# Patient Record
Sex: Male | Born: 1950
Health system: Southern US, Community
[De-identification: ages and names within clinical notes are randomized; demographics above are authoritative.]

## PROBLEM LIST (undated history)

## (undated) DIAGNOSIS — G4730 Sleep apnea, unspecified: Secondary | ICD-10-CM

## (undated) DIAGNOSIS — R7303 Prediabetes: Secondary | ICD-10-CM

## (undated) DIAGNOSIS — M1990 Unspecified osteoarthritis, unspecified site: Secondary | ICD-10-CM

## (undated) DIAGNOSIS — I219 Acute myocardial infarction, unspecified: Secondary | ICD-10-CM

## (undated) DIAGNOSIS — I255 Ischemic cardiomyopathy: Secondary | ICD-10-CM

## (undated) DIAGNOSIS — I10 Essential (primary) hypertension: Secondary | ICD-10-CM

## (undated) DIAGNOSIS — I2510 Atherosclerotic heart disease of native coronary artery without angina pectoris: Secondary | ICD-10-CM

## (undated) DIAGNOSIS — E785 Hyperlipidemia, unspecified: Secondary | ICD-10-CM

## (undated) HISTORY — PX: APPENDECTOMY: SHX54

## (undated) HISTORY — DX: Atherosclerotic heart disease of native coronary artery without angina pectoris: I25.10

## (undated) HISTORY — PX: CORONARY STENT PLACEMENT: SHX1402

## (undated) HISTORY — PX: TRANSTHORACIC ECHOCARDIOGRAM: SHX275

## (undated) HISTORY — DX: Hyperlipidemia, unspecified: E78.5

## (undated) HISTORY — DX: Essential (primary) hypertension: I10

## (undated) HISTORY — DX: Ischemic cardiomyopathy: I25.5

## (undated) HISTORY — PX: INGUINAL HERNIA REPAIR: SUR1180

## (undated) HISTORY — PX: MOUTH SURGERY: SHX715

---
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
## 2011-09-22 ENCOUNTER — Other Ambulatory Visit: Payer: Self-pay | Admitting: Physician Assistant

## 2011-10-15 ENCOUNTER — Ambulatory Visit (INDEPENDENT_AMBULATORY_CARE_PROVIDER_SITE_OTHER): Payer: BC Managed Care – PPO | Admitting: Family Medicine

## 2011-10-15 ENCOUNTER — Encounter: Payer: Self-pay | Admitting: Physician Assistant

## 2011-10-15 VITALS — BP 136/96 | HR 77 | Temp 98.3°F | Resp 16 | Ht 65.5 in | Wt 200.8 lb

## 2011-10-15 DIAGNOSIS — I10 Essential (primary) hypertension: Secondary | ICD-10-CM

## 2011-10-15 DIAGNOSIS — R9431 Abnormal electrocardiogram [ECG] [EKG]: Secondary | ICD-10-CM

## 2011-10-15 DIAGNOSIS — E785 Hyperlipidemia, unspecified: Secondary | ICD-10-CM

## 2011-10-15 DIAGNOSIS — Z0000 Encounter for general adult medical examination without abnormal findings: Secondary | ICD-10-CM

## 2011-10-15 LAB — POCT UA - MICROSCOPIC ONLY
Bacteria, U Microscopic: NEGATIVE
Casts, Ur, LPF, POC: NEGATIVE
Crystals, Ur, HPF, POC: NEGATIVE
Epithelial cells, urine per micros: NEGATIVE
Mucus, UA: NEGATIVE
RBC, urine, microscopic: NEGATIVE
Yeast, UA: NEGATIVE

## 2011-10-15 LAB — CBC
HCT: 42.8 % (ref 39.0–52.0)
Hemoglobin: 15.4 g/dL (ref 13.0–17.0)
MCH: 34.6 pg — ABNORMAL HIGH (ref 26.0–34.0)
MCHC: 36 g/dL (ref 30.0–36.0)
MCV: 96.2 fL (ref 78.0–100.0)
Platelets: 190 10*3/uL (ref 150–400)
RBC: 4.45 MIL/uL (ref 4.22–5.81)
RDW: 11.9 % (ref 11.5–15.5)
WBC: 5.2 10*3/uL (ref 4.0–10.5)

## 2011-10-15 LAB — POCT URINALYSIS DIPSTICK
Bilirubin, UA: NEGATIVE
Blood, UA: NEGATIVE
Glucose, UA: NEGATIVE
Ketones, UA: 1.02
Leukocytes, UA: NEGATIVE
Nitrite, UA: NEGATIVE
Protein, UA: NEGATIVE
Spec Grav, UA: 1.02
Urobilinogen, UA: 0.2
pH, UA: 5.5

## 2011-10-15 LAB — IFOBT (OCCULT BLOOD): IFOBT: NEGATIVE

## 2011-10-15 MED ORDER — ATORVASTATIN CALCIUM 20 MG PO TABS: 20.0000 mg | ORAL_TABLET | Freq: Every day | ORAL | Status: DC

## 2011-10-15 MED ORDER — AMLODIPINE BESYLATE 10 MG PO TABS: 10.0000 mg | ORAL_TABLET | Freq: Every day | ORAL | Status: DC

## 2011-10-15 MED ORDER — LISINOPRIL 10 MG PO TABS: 10.0000 mg | ORAL_TABLET | Freq: Every day | ORAL | Status: DC

## 2011-10-15 NOTE — Progress Notes (Signed)
Patient ID: Vincent Flores MRN: [id number], DOB: 07-05-1950 61 y.o. Date of Encounter: 10/15/2011, 2:08 PM  Primary Physician: No primary provider on file.  Chief Complaint: Physical (CPE)  HPI: 61 y.o. y/o male with history noted below here for CPE. Doing well. No issues/complaints. Last CPE one year prior here at [facility]. Did see Dr. Elnoria Howard in December 2012, was told that he did not need repeat colonoscopy at that time. He in fact needs colonoscopy in December 2014. Patient still does have some constipation, but this is much better.  He denies any chest pain, shortness of breath, dyspnea, palpitations, wheezing, tachycardia, or edema. No sensations of reflux. No abdominal pain. His mother did pass from what he thinks was heart disease at age 49. Has never seen a cardiologist. No prior stress test. No diaphoresis, nausea, or vomiting.    Quit smoking tobacco 8 months prior. Feels good about this. Still drinks about 3 beers a night. No illicit substances. Last tetanus 2012.   Review of Systems: Consitutional: No fever, chills, fatigue, night sweats, lymphadenopathy, or weight changes. Eyes: No visual changes, eye redness, or discharge. ENT/Mouth: Ears: No otalgia, tinnitus, hearing loss, discharge. Nose: No congestion, rhinorrhea, sinus pain, or epistaxis. Throat: No sore throat, post nasal drip, or teeth pain. Cardiovascular: No CP, palpitations, diaphoresis, DOE, edema, orthopnea, PND. Respiratory: No cough, hemoptysis, SOB, or wheezing. Gastrointestinal: No anorexia, dysphagia, reflux, pain, nausea, vomiting, hematemesis, diarrhea, constipation, BRBPR, or melena. Genitourinary: No dysuria, frequency, urgency, hematuria, incontinence, nocturia, decreased urinary stream, discharge, impotence, or testicular pain/masses. Musculoskeletal: No decreased ROM, myalgias, stiffness, joint swelling, or weakness. Skin: No rash, erythema, lesion changes, pain, warmth, jaundice, or pruritis. Neurological: No  headache, dizziness, syncope, seizures, tremors, memory loss, coordination problems, or paresthesias. Psychological: No anxiety, depression, hallucinations, SI/HI. Endocrine: No fatigue, polydipsia, polyphagia, polyuria, or known diabetes. All other systems were reviewed and are otherwise negative.  Past Medical History  Diagnosis Date  . HTN (hypertension)   . Hyperlipidemia      Past Surgical History  Procedure Date  . Appendectomy 1967    Home Meds:  Prior to Admission medications   Medication Sig Start Date End Date Taking? Authorizing Provider  amLODipine (NORVASC) 10 MG tablet Take 10 mg by mouth daily.   Yes Historical Provider, MD  atorvastatin (LIPITOR) 20 MG tablet Take 20 mg by mouth daily.   Yes Historical Provider, MD  lisinopril (PRINIVIL,ZESTRIL) 10 MG tablet TAKE 1 TABLET BY MOUTH EVERY DAY 09/22/11  Yes Morrell Riddle, PA-C    Allergies: No Known Allergies  History   Social History  . Marital Status: Married    Spouse Name: N/A    Number of Children: N/A  . Years of Education: N/A   Occupational History  . Not on file.   Social History Main Topics  . Smoking status: Former Games developer  . Smokeless tobacco: Never Used  . Alcohol Use: 10.5 oz/week    21 drink(s) per week  . Drug Use: No  . Sexually Active: Yes -- Male partner(s)   Other Topics Concern  . Not on file   Social History Narrative  . No narrative on file    Family History  Problem Relation Age of Onset  . Heart disease Mother   . Cancer Father     Lung, was a smoker    Physical Exam: Blood pressure 136/96, pulse 77, temperature 98.3 F (36.8 C), temperature source Oral, resp. rate 16, height 5' 5.5" (1.664 m), weight 200 lb  12.8 oz (91.082 kg), SpO2 98.00%.  General: Well developed, well nourished, in no acute distress. HEENT: Normocephalic, atraumatic. Conjunctiva pink, sclera non-icteric. Pupils 2 mm constricting to 1 mm, round, regular, and equally reactive to light and  accomodation. EOMI. Internal auditory canal clear. TMs with good cone of light and without pathology. Nasal mucosa pink. Nares are without discharge. No sinus tenderness. Oral mucosa pink. Dentition normal. Pharynx without exudate.   Neck: Supple. Trachea midline. No thyromegaly. Full ROM. No lymphadenopathy. Lungs: Clear to auscultation bilaterally without wheezes, rales, or rhonchi. Breathing is of normal effort and unlabored. Cardiovascular: RRR with S1 S2. No murmurs, rubs, or gallops appreciated. Distal pulses 2+ symmetrically. No carotid or abdominal bruits. Abdomen: Soft, non-tender, non-distended with normoactive bowel sounds. No hepatosplenomegaly or masses. No rebound/guarding. No CVA tenderness. Without hernias.  Rectal: No external hemorrhoids or fissures. Rectal vault without masses. Prostate smooth and symmetrical without lesions or tenderness to palpation. Multiple skin tags present around external anus.   Genitourinary: Circumcised male. No penile lesions. Testes descended bilaterally, and smooth without tenderness or masses.  Musculoskeletal: Full range of motion and 5/5 strength throughout. Without swelling, atrophy, tenderness, crepitus, or warmth. Extremities without clubbing, cyanosis, or edema. Calves supple. Skin: Warm and moist without erythema, ecchymosis, wounds, or rash. Neuro: A+Ox3. CN II-XII grossly intact. Moves all extremities spontaneously. Full sensation throughout. Normal gait. DTR 2+ throughout upper and lower extremities. Finger to nose intact. Psych:  Responds to questions appropriately with a normal affect.   Studies:  Results for orders placed in visit on 10/15/11  POCT UA - MICROSCOPIC ONLY      Component Value Range   WBC, Ur, HPF, POC nrg     RBC, urine, microscopic neg     Bacteria, U Microscopic neg     Mucus, UA neg     Epithelial cells, urine per micros neg     Crystals, Ur, HPF, POC neg     Casts, Ur, LPF, POC neg     Yeast, UA neg    POCT  URINALYSIS DIPSTICK      Component Value Range   Color, UA yellow     Clarity, UA clear     Glucose, UA neg     Bilirubin, UA neg     Ketones, UA 1.020     Spec Grav, UA 1.020     Blood, UA neg     pH, UA 5.5     Protein, UA neg     Urobilinogen, UA 0.2     Nitrite, UA neg     Leukocytes, UA Negative    IFOBT (OCCULT BLOOD)      Component Value Range   IFOBT Negative      CBC, CMET, Lipid, PSA, TSH all pending. Patient is fasting.  EKG: No reciprocal ST changes, nonspecific ST changes, diffuse P wave abnormalities c/w longstanding smoking history discussed with Dr. Jacinto Halim.   Assessment/Plan:  61 y.o. y/o Caucasian male here for CPE with abnormal EKG, hypertension, and hyperlipidemia. 1. Abnormal EKG -Called and spoke with Dr. Jacinto Halim -EKG faxed to Dr. Jacinto Halim for interpretation -See above EKG interpretation -Patient referred to cardiology for risk stratification -Initial EKG taken at 2 PM second EKG taken at 3:30 PM to check for any evolving changes and none were seen. Patient was discharged home with ER precautions and will see Dr. Jacinto Halim for risk stratification. -RTC/ER precautions  2. Hypertension -Controlled at baseline and at home -Continue current treatment -Refilled medications -Norvasc 10 mg  1 po daily #90 RF 3 -Lisinopril 10 mg 1 po daily #90 RF 3 -Check readings periodically at home, call if elevated -Healthy diet and weight loss -Kudos for quitting tobacco use  3. Hyperlipidemia -Continue Lipitor 20 mg 1 po daily #90 RF 3 -Healthy diet and exercise -Weight loss -Await labs  4. CPE -Healthy diet and exercise -Weight loss -Kudos for quitting smoking -Declines flu vaccine today -Await labs -Up to date with colonoscopy, saw Dr. Elnoria Howard in December 2012, was told did not need follow up colonoscopy until December 2014. -Anticipatory guidance  Signed, Eula Listen, PA-C 10/15/2011 2:08 PM

## 2011-10-16 ENCOUNTER — Other Ambulatory Visit: Payer: Self-pay | Admitting: Physician Assistant

## 2011-10-16 LAB — COMPREHENSIVE METABOLIC PANEL
ALT: 27 U/L (ref 0–53)
AST: 19 U/L (ref 0–37)
Albumin: 4.5 g/dL (ref 3.5–5.2)
Alkaline Phosphatase: 68 U/L (ref 39–117)
BUN: 10 mg/dL (ref 6–23)
CO2: 25 mEq/L (ref 19–32)
Calcium: 9.2 mg/dL (ref 8.4–10.5)
Chloride: 104 mEq/L (ref 96–112)
Creat: 0.91 mg/dL (ref 0.50–1.35)
Glucose, Bld: 93 mg/dL (ref 70–99)
Potassium: 3.9 mEq/L (ref 3.5–5.3)
Sodium: 136 mEq/L (ref 135–145)
Total Bilirubin: 0.6 mg/dL (ref 0.3–1.2)
Total Protein: 6.8 g/dL (ref 6.0–8.3)

## 2011-10-16 LAB — LIPID PANEL
Cholesterol: 228 mg/dL — ABNORMAL HIGH (ref 0–200)
HDL: 52 mg/dL (ref >39)
LDL Cholesterol: 145 mg/dL — ABNORMAL HIGH (ref 0–99)
Total CHOL/HDL Ratio: 4.4 Ratio
Triglycerides: 154 mg/dL — ABNORMAL HIGH (ref <150)
VLDL: 31 mg/dL (ref 0–40)

## 2011-10-16 LAB — TSH: TSH: 2.443 u[IU]/mL (ref 0.350–4.500)

## 2011-10-16 LAB — PSA: PSA: 0.57 ng/mL (ref <=4.00)

## 2011-10-16 MED ORDER — ROSUVASTATIN CALCIUM 20 MG PO TABS: 20.0000 mg | ORAL_TABLET | Freq: Every day | ORAL | Status: DC

## 2011-10-16 NOTE — Progress Notes (Signed)
Please see detailed note under labs from CPE on 10/15/11.  

## 2011-10-17 NOTE — Progress Notes (Signed)
EKG read and patient discussed with Eula Listen, PA-C. Agree with assessment and plan of care per his note, including cardiology follow up as abnormal ekg discussed with cardiologist.

## 2011-10-18 ENCOUNTER — Ambulatory Visit
Admission: RE | Admit: 2011-10-18 | Discharge: 2011-10-18 | Disposition: A | Discharge status: Home / Self Care | Payer: BC Managed Care – PPO | Source: Ambulatory Visit | Attending: Cardiology | Admitting: Cardiology

## 2011-10-18 ENCOUNTER — Other Ambulatory Visit: Payer: Self-pay | Admitting: Cardiology

## 2011-10-18 DIAGNOSIS — R0602 Shortness of breath: Secondary | ICD-10-CM

## 2011-10-22 ENCOUNTER — Telehealth: Payer: Self-pay

## 2011-10-22 MED ORDER — ROSUVASTATIN CALCIUM 20 MG PO TABS: 20.0000 mg | ORAL_TABLET | Freq: Every day | ORAL | Status: DC

## 2011-10-22 NOTE — Telephone Encounter (Signed)
Spouse would like to know if her husband got his flu shot.   Also, wants the crestor script written and she will pick it up this time.  In the future she wants it to go to [facility].   Please call [phone]

## 2011-10-22 NOTE — Telephone Encounter (Signed)
LMOM stating a 30 day supply of Crestor is at [facility]. Stated we would change the other 5 refills to be filled at [facility]. Also LMOM stating that Bensen did not get his flu shot last week. JF  AMY: Please send 5 refills of Crestor 20mg  QD # 30 to [facility] for PT. Thanks!!!

## 2011-10-22 NOTE — Telephone Encounter (Signed)
Sent this in for him. 

## 2011-10-24 ENCOUNTER — Telehealth: Payer: Self-pay

## 2011-10-24 ENCOUNTER — Other Ambulatory Visit: Payer: Self-pay | Admitting: Physician Assistant

## 2011-10-24 NOTE — Telephone Encounter (Signed)
Patient's wife is calling stating that [facility] requires a 90 day supply prescription for crestor and we only sent 30 days? They informed her we need to call [phone] to request a fax from them for authorization??? I asked her to call that number and give our fax number. If all goes well, they will be sending [place] a fax to resend the correct rx. Patient is worried about further delays on receiving and beginning the medication.  Best [phone]  [facility] - [place], [place] - [address]

## 2011-10-24 NOTE — Progress Notes (Signed)
Please call the patient and have him come in and see me when he is able to over the next week or two for a CXR both PA and lateral at the recommendation of Dr. Jacinto Halim.   Eula Listen, PA-C 10/24/2011 8:06 PM

## 2011-10-25 ENCOUNTER — Telehealth: Payer: Self-pay | Admitting: Radiology

## 2011-10-25 NOTE — Progress Notes (Signed)
I have left message for him to call me back with recommendation from Dr Jacinto Halim, he needs chest xray

## 2011-10-25 NOTE — Telephone Encounter (Signed)
Pt returning Amy L phone call. Please contact.

## 2011-10-25 NOTE — Telephone Encounter (Signed)
Vincent Flores 10/24/2011 8:06 PM Signed  Please call the patient and have him come in and see me when he is able to over the next week or two for a CXR both PA and lateral at the recommendation of Dr. Jacinto Halim.    I left message for patient to call me back about these recommendations.

## 2011-10-28 NOTE — Telephone Encounter (Signed)
Pt wife called to say that pt has already had a chest x-ray. Please contact pt on what to advise. [phone] or Dorinda Hill [phone]

## 2011-10-29 ENCOUNTER — Telehealth: Payer: Self-pay | Admitting: Family Medicine

## 2011-10-29 MED ORDER — ROSUVASTATIN CALCIUM 20 MG PO TABS: 20.0000 mg | ORAL_TABLET | Freq: Every day | ORAL | Status: DC

## 2011-10-29 NOTE — Telephone Encounter (Signed)
I apologize. I was unaware that he had this CXR done. No further study is required.

## 2011-10-29 NOTE — Telephone Encounter (Signed)
No answer left message on machine to call back

## 2011-10-29 NOTE — Telephone Encounter (Signed)
Called wife, to advise left detailed mssg. Per her last message, to advise no further xray needed.

## 2011-10-29 NOTE — Telephone Encounter (Signed)
*  RADIOLOGY REPORT*  Clinical Data: Short of breath  CHEST - 2 VIEW  Comparison: 04/24/2010  Findings: Lungs are clear without infiltrate or effusion. Negative  for heart failure. No mass lesion is identified. Mild thoracic  degenerative changes and spurring.  IMPRESSION:  No active cardiopulmonary disease.  Original Report Authenticated By: Camelia Phenes, M.D.   *Patient had this chest xray done at [facility] on 10/18/11 please advise if you need an additional chest xray or if you were unaware he had this one done*   ,  Wall, RT 10/25/2011 9:42 AM Signed  I have left message for him to call me back with recommendation from Dr Jacinto Halim, he needs chest xray Eula Listen, PA-C 10/24/2011 8:06 PM Signed  Please call the patient and have him come in and see me when he is able to over the next week or two for a CXR both PA and lateral at the recommendation of Dr. Jacinto Halim.

## 2011-10-29 NOTE — Telephone Encounter (Signed)
Sent through order for 90 day suply

## 2011-10-29 NOTE — Telephone Encounter (Signed)
Left message to give us a call back

## 2011-10-31 ENCOUNTER — Encounter: Payer: Self-pay | Admitting: Family Medicine

## 2011-11-03 ENCOUNTER — Other Ambulatory Visit: Payer: Self-pay

## 2011-11-03 MED ORDER — ROSUVASTATIN CALCIUM 20 MG PO TABS: 20.0000 mg | ORAL_TABLET | Freq: Every day | ORAL | Status: DC

## 2011-11-08 HISTORY — PX: TRANSTHORACIC ECHOCARDIOGRAM: SHX275

## 2012-02-14 ENCOUNTER — Encounter: Payer: Self-pay | Admitting: *Deleted

## 2012-02-14 DIAGNOSIS — R9431 Abnormal electrocardiogram [ECG] [EKG]: Secondary | ICD-10-CM | POA: Insufficient documentation

## 2013-03-26 ENCOUNTER — Encounter: Payer: Self-pay | Admitting: Family Medicine

## 2013-05-21 ENCOUNTER — Encounter: Payer: Self-pay | Admitting: Family Medicine

## 2013-12-04 ENCOUNTER — Ambulatory Visit (INDEPENDENT_AMBULATORY_CARE_PROVIDER_SITE_OTHER): Payer: BC Managed Care – PPO | Admitting: Family Medicine

## 2013-12-04 ENCOUNTER — Encounter: Payer: Self-pay | Admitting: Family Medicine

## 2013-12-04 VITALS — BP 140/80 | HR 70 | Temp 98.2°F | Resp 18 | Ht 65.5 in | Wt 197.0 lb

## 2013-12-04 DIAGNOSIS — Z0000 Encounter for general adult medical examination without abnormal findings: Secondary | ICD-10-CM

## 2013-12-04 DIAGNOSIS — I10 Essential (primary) hypertension: Secondary | ICD-10-CM

## 2013-12-04 DIAGNOSIS — Z130 Encounter for screening for diseases of the blood and blood-forming organs and certain disorders involving the immune mechanism: Secondary | ICD-10-CM

## 2013-12-04 DIAGNOSIS — K219 Gastro-esophageal reflux disease without esophagitis: Secondary | ICD-10-CM

## 2013-12-04 DIAGNOSIS — Z125 Encounter for screening for malignant neoplasm of prostate: Secondary | ICD-10-CM

## 2013-12-04 DIAGNOSIS — E785 Hyperlipidemia, unspecified: Secondary | ICD-10-CM

## 2013-12-04 LAB — POCT URINALYSIS DIPSTICK
Bilirubin, UA: NEGATIVE
Blood, UA: NEGATIVE
Glucose, UA: NEGATIVE
Ketones, UA: NEGATIVE
Leukocytes, UA: NEGATIVE
Nitrite, UA: NEGATIVE
Protein, UA: NEGATIVE
Spec Grav, UA: 1.01
Urobilinogen, UA: 0.2
pH, UA: 5

## 2013-12-04 MED ORDER — AMLODIPINE BESYLATE 10 MG PO TABS: 10.0000 mg | ORAL_TABLET | Freq: Every day | ORAL | Status: DC

## 2013-12-04 MED ORDER — LISINOPRIL 10 MG PO TABS: 10.0000 mg | ORAL_TABLET | Freq: Every day | ORAL | Status: DC

## 2013-12-04 MED ORDER — LANSOPRAZOLE 30 MG PO CPDR: 30.0000 mg | DELAYED_RELEASE_CAPSULE | Freq: Every day | ORAL | Status: DC

## 2013-12-04 MED ORDER — ROSUVASTATIN CALCIUM 10 MG PO TABS: ORAL_TABLET | ORAL | Status: DC

## 2013-12-04 NOTE — Progress Notes (Signed)
 Chief Complaint:  Chief Complaint  Patient presents with  . Annual Exam    HPI: Vincent Flores is a 63 y.o. male who is here for annual,  last PE was 2013 No complaints, colonscopy was normal in 2011 Dr Benson [place] and he just had one again a couple days ago HE ahs no complaints, taking his HTN and hyperlipidemia meds but he was having msk aches and stopped taking the medication , he was only on pravastatin 10 mg  Quit smoking tobacco 8 months prior. Feels good about this. Still drinks about 3 beers a night. No illicit substances. Last tetanus 2012.   Past Medical History  Diagnosis Date  . HTN (hypertension)   . Hyperlipidemia   . Shortness of breath   . Abnormal EKG    Past Surgical History  Procedure Laterality Date  . Appendectomy  1967  . Transthoracic echocardiogram  [id number]    normal global wall motion.  normal systolic global function.  calculated EF 61 %   History   Social History  . Marital Status: Married    Spouse Name: N/A    Number of Children: N/A  . Years of Education: N/A   Social History Main Topics  . Smoking status: Former Research scientist (life sciences)  . Smokeless tobacco: Never Used  . Alcohol Use: 10.5 oz/week    21 drink(s) per week  . Drug Use: No  . Sexual Activity:    Partners: Female   Other Topics Concern  . None   Social History Narrative   Family History  Problem Relation Age of Onset  . Heart disease Mother   . Cancer Father     Lung, was a smoker   No Known Allergies Prior to Admission medications   Medication Sig Start Date End Date Taking? Authorizing Provider  amLODipine (NORVASC) 10 MG tablet Take 1 tablet (10 mg total) by mouth daily. 10/15/11  Yes Ryan M Dunn, PA-C  lansoprazole (PREVACID) 30 MG capsule Take 30 mg by mouth daily at 12 noon.   Yes Historical Provider, MD  lisinopril (PRINIVIL,ZESTRIL) 10 MG tablet Take 1 tablet (10 mg total) by mouth daily. 10/15/11   Areta Haber Dunn, PA-C  rosuvastatin (CRESTOR) 20 MG tablet Take 1 tablet (20 mg  total) by mouth daily. 11/03/11   Rise Mu, PA-C     ROS: The patient denies fevers, chills, night sweats, unintentional weight loss, chest pain, palpitations, wheezing, dyspnea on exertion, nausea, vomiting, abdominal pain, dysuria, hematuria, melena, numbness, weakness, or tingling.   All other systems have been reviewed and were otherwise negative with the exception of those mentioned in the HPI and as above.    PHYSICAL EXAM: Filed Vitals:   12/04/13 0858  BP: 140/80  Pulse: 70  Temp: 98.2 F (36.8 C)  Resp: 18   Filed Vitals:   12/04/13 0858  Height: 5' 5.5" (1.664 m)  Weight: 197 lb (89.359 kg)   Body mass index is 32.27 kg/(m^2).  General: Alert, no acute distress HEENT:  Normocephalic, atraumatic, oropharynx patent. EOMI, PERRLA  TM nl, fundoscopic exam nl, no thyroid megaly Cardiovascular:  Regular rate and rhythm, no rubs murmurs or gallops.  No Carotid bruits, radial pulse intact. No pedal edema.  Respiratory: Clear to auscultation bilaterally.  No wheezes, rales, or rhonchi.  No cyanosis, no use of accessory musculature GI: No organomegaly, abdomen is soft and non-tender, positive bowel sounds.  No masses. Skin: No rashes. Neurologic: Facial musculature symmetric. Psychiatric: Patient is appropriate  throughout our interaction. Lymphatic: No cervical lymphadenopathy Musculoskeletal: Gait intact. 5/5 2/2 DTRS UE and  GU-defer till next visit   LABS: Results for orders placed or performed in visit on 10/15/11  CBC  Result Value Ref Range   WBC 5.2 4.0 - 10.5 K/uL   RBC 4.45 4.22 - 5.81 MIL/uL   Hemoglobin 15.4 13.0 - 17.0 g/dL   HCT 42.8 39.0 - 52.0 %   MCV 96.2 78.0 - 100.0 fL   MCH 34.6 (H) 26.0 - 34.0 pg   MCHC 36.0 30.0 - 36.0 g/dL   RDW 11.9 11.5 - 15.5 %   Platelets 190 150 - 400 K/uL  Comprehensive metabolic panel  Result Value Ref Range   Sodium 136 135 - 145 mEq/L   Potassium 3.9 3.5 - 5.3 mEq/L   Chloride 104 96 - 112 mEq/L   CO2 25 19 -  32 mEq/L   Glucose, Bld 93 70 - 99 mg/dL   BUN 10 6 - 23 mg/dL   Creat 0.91 0.50 - 1.35 mg/dL   Total Bilirubin 0.6 0.3 - 1.2 mg/dL   Alkaline Phosphatase 68 39 - 117 U/L   AST 19 0 - 37 U/L   ALT 27 0 - 53 U/L   Total Protein 6.8 6.0 - 8.3 g/dL   Albumin 4.5 3.5 - 5.2 g/dL   Calcium 9.2 8.4 - 10.5 mg/dL  Lipid panel  Result Value Ref Range   Cholesterol 228 (H) 0 - 200 mg/dL   Triglycerides 154 (H) <150 mg/dL   HDL 52 >39 mg/dL   Total CHOL/HDL Ratio 4.4 Ratio   VLDL 31 0 - 40 mg/dL   LDL Cholesterol 145 (H) 0 - 99 mg/dL  PSA  Result Value Ref Range   PSA 0.57 <=4.00 ng/mL  TSH  Result Value Ref Range   TSH 2.443 0.350 - 4.500 uIU/mL  POCT UA - Microscopic Only  Result Value Ref Range   WBC, Ur, HPF, POC nrg    RBC, urine, microscopic neg    Bacteria, U Microscopic neg    Mucus, UA neg    Epithelial cells, urine per micros neg    Crystals, Ur, HPF, POC neg    Casts, Ur, LPF, POC neg    Yeast, UA neg   POCT urinalysis dipstick  Result Value Ref Range   Color, UA yellow    Clarity, UA clear    Glucose, UA neg    Bilirubin, UA neg    Ketones, UA 1.020    Spec Grav, UA 1.020    Blood, UA neg    pH, UA 5.5    Protein, UA neg    Urobilinogen, UA 0.2    Nitrite, UA neg    Leukocytes, UA Negative   IFOBT POC (occult bld, rslt in office)  Result Value Ref Range   IFOBT Negative      EKG/XRAY:   Primary read interpreted by Dr. Marin Comment at [facility].   ASSESSMENT/PLAN: Encounter Diagnoses  Name Primary?  . Annual physical exam Yes  . Screening for deficiency anemia   . Hyperlipidemia   . Essential hypertension   . Gastroesophageal reflux disease without esophagitis   . Screening for prostate cancer    Declined flu vaccine He just had colonscopy so did not want a check of his prostate , would like to defer until; next visit Annual labs pending F/u in 2 months for recheck of hyperlipidemia, he will take pravastatin 5 mg daily so 1.2 of the  10 mg and then also take  fish oil daily 2-3 capsules to see if help with his mask and jt pain.  Next visit he will need a GU exam since we deferred it sinc ehe recenlty had colonscopy.    Gross sideeffects, risk and benefits, and alternatives of medications d/w patient. Patient is aware that all medications have potential sideeffects and we are unable to predict every sideeffect or drug-drug interaction that may occur.  , [place], DO 12/04/2013 10:17 AM

## 2013-12-05 LAB — COMPREHENSIVE METABOLIC PANEL
ALT: 24 IU/L (ref 0–44)
AST: 16 IU/L (ref 0–40)
Albumin/Globulin Ratio: 2 (ref 1.1–2.5)
Albumin: 4.5 g/dL (ref 3.6–4.8)
Alkaline Phosphatase: 85 IU/L (ref 39–117)
BUN/Creatinine Ratio: 10 (ref 10–22)
BUN: 8 mg/dL (ref 8–27)
CO2: 23 mmol/L (ref 18–29)
Calcium: 9.4 mg/dL (ref 8.6–10.2)
Chloride: 103 mmol/L (ref 97–108)
Creatinine, Ser: 0.84 mg/dL (ref 0.76–1.27)
GFR calc Af Amer: 108 mL/min/{1.73_m2} (ref >59)
GFR calc non Af Amer: 94 mL/min/{1.73_m2} (ref >59)
Globulin, Total: 2.3 g/dL (ref 1.5–4.5)
Glucose: 104 mg/dL — ABNORMAL HIGH (ref 65–99)
Potassium: 4.4 mmol/L (ref 3.5–5.2)
Sodium: 140 mmol/L (ref 134–144)
Total Bilirubin: 0.4 mg/dL (ref 0.0–1.2)
Total Protein: 6.8 g/dL (ref 6.0–8.5)

## 2013-12-05 LAB — CBC WITH DIFFERENTIAL/PLATELET
Basophils Absolute: 0.1 10*3/uL (ref 0.0–0.2)
Basos: 1 %
Eos: 4 %
Eosinophils Absolute: 0.2 10*3/uL (ref 0.0–0.4)
HCT: 43.5 % (ref 37.5–51.0)
Hemoglobin: 15.7 g/dL (ref 12.6–17.7)
Immature Grans (Abs): 0 10*3/uL (ref 0.0–0.1)
Immature Granulocytes: 0 %
Lymphocytes Absolute: 1.6 10*3/uL (ref 0.7–3.1)
Lymphs: 31 %
MCH: 34.2 pg — ABNORMAL HIGH (ref 26.6–33.0)
MCHC: 36.1 g/dL — ABNORMAL HIGH (ref 31.5–35.7)
MCV: 95 fL (ref 79–97)
Monocytes Absolute: 0.6 10*3/uL (ref 0.1–0.9)
Monocytes: 12 %
Neutrophils Absolute: 2.7 10*3/uL (ref 1.4–7.0)
Neutrophils Relative %: 52 %
RBC: 4.59 x10E6/uL (ref 4.14–5.80)
RDW: 12.3 % (ref 12.3–15.4)
WBC: 5.2 10*3/uL (ref 3.4–10.8)

## 2013-12-05 LAB — LIPID PANEL
Chol/HDL Ratio: 4.5 ratio units (ref 0.0–5.0)
Cholesterol, Total: 206 mg/dL — ABNORMAL HIGH (ref 100–199)
HDL: 46 mg/dL (ref >39)
LDL Calculated: 137 mg/dL — ABNORMAL HIGH (ref 0–99)
Triglycerides: 113 mg/dL (ref 0–149)
VLDL Cholesterol Cal: 23 mg/dL (ref 5–40)

## 2013-12-05 LAB — TSH: TSH: 2.29 u[IU]/mL (ref 0.450–4.500)

## 2013-12-05 LAB — PSA: PSA: 0.4 ng/mL (ref 0.0–4.0)

## 2013-12-15 ENCOUNTER — Encounter: Payer: Self-pay | Admitting: Family Medicine

## 2014-10-29 ENCOUNTER — Inpatient Hospital Stay (HOSPITAL_COMMUNITY)
Admission: EM | Admit: 2014-10-29 | Discharge: 2014-11-01 | DRG: 247 | Disposition: A | Discharge status: Home / Self Care | Payer: BLUE CROSS/BLUE SHIELD | Attending: Cardiovascular Disease | Admitting: Cardiovascular Disease

## 2014-10-29 ENCOUNTER — Encounter (HOSPITAL_COMMUNITY)
Admission: EM | Disposition: A | Discharge status: Home / Self Care | Payer: Self-pay | Source: Home / Self Care | Attending: Cardiovascular Disease

## 2014-10-29 ENCOUNTER — Encounter (HOSPITAL_COMMUNITY): Payer: Self-pay | Admitting: *Deleted

## 2014-10-29 ENCOUNTER — Emergency Department (HOSPITAL_COMMUNITY): Payer: BLUE CROSS/BLUE SHIELD

## 2014-10-29 DIAGNOSIS — R9431 Abnormal electrocardiogram [ECG] [EKG]: Secondary | ICD-10-CM | POA: Diagnosis present

## 2014-10-29 DIAGNOSIS — Z8249 Family history of ischemic heart disease and other diseases of the circulatory system: Secondary | ICD-10-CM

## 2014-10-29 DIAGNOSIS — I2510 Atherosclerotic heart disease of native coronary artery without angina pectoris: Secondary | ICD-10-CM | POA: Diagnosis present

## 2014-10-29 DIAGNOSIS — Z955 Presence of coronary angioplasty implant and graft: Secondary | ICD-10-CM

## 2014-10-29 DIAGNOSIS — I255 Ischemic cardiomyopathy: Secondary | ICD-10-CM | POA: Diagnosis present

## 2014-10-29 DIAGNOSIS — Z87891 Personal history of nicotine dependence: Secondary | ICD-10-CM

## 2014-10-29 DIAGNOSIS — Z79899 Other long term (current) drug therapy: Secondary | ICD-10-CM

## 2014-10-29 DIAGNOSIS — R079 Chest pain, unspecified: Secondary | ICD-10-CM | POA: Diagnosis not present

## 2014-10-29 DIAGNOSIS — Z7982 Long term (current) use of aspirin: Secondary | ICD-10-CM | POA: Diagnosis not present

## 2014-10-29 DIAGNOSIS — I10 Essential (primary) hypertension: Secondary | ICD-10-CM | POA: Diagnosis present

## 2014-10-29 DIAGNOSIS — E785 Hyperlipidemia, unspecified: Secondary | ICD-10-CM | POA: Diagnosis present

## 2014-10-29 DIAGNOSIS — I214 Non-ST elevation (NSTEMI) myocardial infarction: Secondary | ICD-10-CM | POA: Diagnosis present

## 2014-10-29 HISTORY — PX: CARDIAC CATHETERIZATION: SHX172

## 2014-10-29 LAB — MRSA PCR SCREENING: MRSA by PCR: NEGATIVE

## 2014-10-29 LAB — HEPATIC FUNCTION PANEL
ALT: 37 U/L (ref 17–63)
AST: 110 U/L — ABNORMAL HIGH (ref 15–41)
Albumin: 3.4 g/dL — ABNORMAL LOW (ref 3.5–5.0)
Alkaline Phosphatase: 67 U/L (ref 38–126)
Bilirubin, Direct: 0.1 mg/dL (ref 0.1–0.5)
Indirect Bilirubin: 0.6 mg/dL (ref 0.3–0.9)
Total Bilirubin: 0.7 mg/dL (ref 0.3–1.2)
Total Protein: 6 g/dL — ABNORMAL LOW (ref 6.5–8.1)

## 2014-10-29 LAB — BASIC METABOLIC PANEL
Anion gap: 11 (ref 5–15)
BUN: 12 mg/dL (ref 6–20)
CO2: 24 mmol/L (ref 22–32)
Calcium: 10.1 mg/dL (ref 8.9–10.3)
Chloride: 106 mmol/L (ref 101–111)
Creatinine, Ser: 1.16 mg/dL (ref 0.61–1.24)
GFR calc Af Amer: >60 mL/min (ref >60)
GFR calc non Af Amer: >60 mL/min (ref >60)
Glucose, Bld: 135 mg/dL — ABNORMAL HIGH (ref 65–99)
Potassium: 3.5 mmol/L (ref 3.5–5.1)
Sodium: 141 mmol/L (ref 135–145)

## 2014-10-29 LAB — TROPONIN I
Troponin I: 20.94 ng/mL (ref <0.031)
Troponin I: 13.19 ng/mL (ref <0.031)

## 2014-10-29 LAB — I-STAT TROPONIN, ED
Troponin i, poc: 0 ng/mL (ref 0.00–0.08)
Troponin i, poc: 2.17 ng/mL (ref 0.00–0.08)

## 2014-10-29 LAB — CBC
HCT: 47.2 % (ref 39.0–52.0)
HCT: 40.8 % (ref 39.0–52.0)
Hemoglobin: 17 g/dL (ref 13.0–17.0)
Hemoglobin: 14.1 g/dL (ref 13.0–17.0)
MCH: 35.5 pg — ABNORMAL HIGH (ref 26.0–34.0)
MCH: 34.3 pg — ABNORMAL HIGH (ref 26.0–34.0)
MCHC: 36 g/dL (ref 30.0–36.0)
MCHC: 34.6 g/dL (ref 30.0–36.0)
MCV: 98.5 fL (ref 78.0–100.0)
MCV: 99.3 fL (ref 78.0–100.0)
Platelets: 201 10*3/uL (ref 150–400)
Platelets: 147 10*3/uL — ABNORMAL LOW (ref 150–400)
RBC: 4.79 MIL/uL (ref 4.22–5.81)
RBC: 4.11 MIL/uL — ABNORMAL LOW (ref 4.22–5.81)
RDW: 12.3 % (ref 11.5–15.5)
RDW: 12.4 % (ref 11.5–15.5)
WBC: 9.1 10*3/uL (ref 4.0–10.5)
WBC: 9.5 10*3/uL (ref 4.0–10.5)

## 2014-10-29 LAB — CREATININE, SERUM
Creatinine, Ser: 1.1 mg/dL (ref 0.61–1.24)
GFR calc Af Amer: >60 mL/min (ref >60)
GFR calc non Af Amer: >60 mL/min (ref >60)

## 2014-10-29 LAB — POCT ACTIVATED CLOTTING TIME
Activated Clotting Time: >1000 seconds
Activated Clotting Time: 608 seconds

## 2014-10-29 LAB — PROTIME-INR
INR: 0.91 (ref 0.00–1.49)
Prothrombin Time: 12.4 seconds (ref 11.6–15.2)

## 2014-10-29 SURGERY — LEFT HEART CATH AND CORONARY ANGIOGRAPHY
Anesthesia: LOCAL

## 2014-10-29 MED ORDER — ATORVASTATIN CALCIUM 80 MG PO TABS: 80.0000 mg | ORAL_TABLET | Freq: Every day | ORAL | Status: DC

## 2014-10-29 MED ORDER — FENTANYL CITRATE (PF) 100 MCG/2ML IJ SOLN
INTRAMUSCULAR | Status: DC | PRN
Start: 2014-10-29 — End: 2014-10-29
  Administered 2014-10-29 (×4): 50 ug via INTRAVENOUS

## 2014-10-29 MED ORDER — MIDAZOLAM HCL 2 MG/2ML IJ SOLN
INTRAMUSCULAR | Status: DC | PRN
  Administered 2014-10-29 (×4): 1 mg via INTRAVENOUS

## 2014-10-29 MED ORDER — LISINOPRIL 10 MG PO TABS
10.0000 mg | ORAL_TABLET | Freq: Every day | ORAL | Status: DC
  Administered 2014-10-30 – 2014-11-01 (×3): 10 mg via ORAL
  Filled 2014-10-29 (×3): qty 1

## 2014-10-29 MED ORDER — HEPARIN BOLUS VIA INFUSION
4000.0000 [IU] | Freq: Once | INTRAVENOUS | Status: AC
  Administered 2014-10-29: 4000 [IU] via INTRAVENOUS
  Filled 2014-10-29: qty 4000

## 2014-10-29 MED ORDER — ATORVASTATIN CALCIUM 80 MG PO TABS
80.0000 mg | ORAL_TABLET | Freq: Every day | ORAL | Status: DC
  Administered 2014-10-29 – 2014-10-31 (×3): 80 mg via ORAL
  Filled 2014-10-29 (×3): qty 1

## 2014-10-29 MED ORDER — ACETAMINOPHEN 325 MG PO TABS
650.0000 mg | ORAL_TABLET | ORAL | Status: DC | PRN
  Filled 2014-10-29: qty 2

## 2014-10-29 MED ORDER — METOPROLOL SUCCINATE ER 25 MG PO TB24: 25.0000 mg | ORAL_TABLET | Freq: Every day | ORAL | Status: DC

## 2014-10-29 MED ORDER — METOPROLOL TARTRATE 12.5 MG HALF TABLET
12.5000 mg | ORAL_TABLET | Freq: Two times a day (BID) | ORAL | Status: DC
  Administered 2014-10-29 – 2014-10-30 (×3): 12.5 mg via ORAL
  Filled 2014-10-29 (×3): qty 1

## 2014-10-29 MED ORDER — SODIUM CHLORIDE 0.9 % IJ SOLN: 3.0000 mL | INTRAMUSCULAR | Status: DC | PRN

## 2014-10-29 MED ORDER — HEPARIN (PORCINE) IN NACL 2-0.9 UNIT/ML-% IJ SOLN
INTRAMUSCULAR | Status: AC
  Filled 2014-10-29: qty 500

## 2014-10-29 MED ORDER — ACETAMINOPHEN 325 MG PO TABS: 650.0000 mg | ORAL_TABLET | ORAL | Status: DC | PRN

## 2014-10-29 MED ORDER — ASPIRIN 81 MG PO CHEW
81.0000 mg | CHEWABLE_TABLET | Freq: Every day | ORAL | Status: DC
  Administered 2014-10-30: 81 mg via ORAL
  Filled 2014-10-29 (×2): qty 1

## 2014-10-29 MED ORDER — HEPARIN SODIUM (PORCINE) 1000 UNIT/ML IJ SOLN
INTRAMUSCULAR | Status: AC
  Filled 2014-10-29: qty 1

## 2014-10-29 MED ORDER — ONDANSETRON HCL 4 MG/2ML IJ SOLN: 4.0000 mg | Freq: Four times a day (QID) | INTRAMUSCULAR | Status: DC | PRN

## 2014-10-29 MED ORDER — BIVALIRUDIN BOLUS VIA INFUSION - CUPID
INTRAVENOUS | Status: DC | PRN
  Administered 2014-10-29: 68.025 mg via INTRAVENOUS

## 2014-10-29 MED ORDER — ASPIRIN EC 81 MG PO TBEC: 81.0000 mg | DELAYED_RELEASE_TABLET | Freq: Every day | ORAL | Status: DC

## 2014-10-29 MED ORDER — ASPIRIN 81 MG PO CHEW: 81.0000 mg | CHEWABLE_TABLET | ORAL | Status: DC

## 2014-10-29 MED ORDER — MIDAZOLAM HCL 2 MG/2ML IJ SOLN
INTRAMUSCULAR | Status: AC
  Filled 2014-10-29: qty 4

## 2014-10-29 MED ORDER — TICAGRELOR 90 MG PO TABS
ORAL_TABLET | ORAL | Status: AC
  Filled 2014-10-29: qty 2

## 2014-10-29 MED ORDER — SODIUM CHLORIDE 0.9 % IV SOLN: 250.0000 mL | INTRAVENOUS | Status: DC | PRN

## 2014-10-29 MED ORDER — VERAPAMIL HCL 2.5 MG/ML IV SOLN
INTRAVENOUS | Status: DC | PRN
Start: 2014-10-29 — End: 2014-10-29
  Administered 2014-10-29 (×2): 100 ug via INTRACORONARY

## 2014-10-29 MED ORDER — ASPIRIN 81 MG PO CHEW
81.0000 mg | CHEWABLE_TABLET | Freq: Once | ORAL | Status: AC
Start: 2014-10-29 — End: 2014-10-29
  Administered 2014-10-29: 81 mg via ORAL
  Filled 2014-10-29: qty 1

## 2014-10-29 MED ORDER — TICAGRELOR 90 MG PO TABS
ORAL_TABLET | ORAL | Status: DC | PRN
  Administered 2014-10-29: 180 mg via ORAL

## 2014-10-29 MED ORDER — SODIUM CHLORIDE 0.9 % IV SOLN
INTRAVENOUS | Status: DC | PRN
  Administered 2014-10-29: 91 mL via INTRAVENOUS

## 2014-10-29 MED ORDER — ASPIRIN EC 81 MG PO TBEC
81.0000 mg | DELAYED_RELEASE_TABLET | Freq: Every day | ORAL | Status: DC
  Administered 2014-10-31 – 2014-11-01 (×2): 81 mg via ORAL
  Filled 2014-10-29 (×2): qty 1

## 2014-10-29 MED ORDER — IOHEXOL 350 MG/ML SOLN
100.0000 mL | Freq: Once | INTRAVENOUS | Status: AC | PRN
Start: 2014-10-29 — End: 2014-10-29
  Administered 2014-10-29: 100 mL via INTRAVENOUS

## 2014-10-29 MED ORDER — NITROGLYCERIN IN D5W 200-5 MCG/ML-% IV SOLN
INTRAVENOUS | Status: DC | PRN
  Administered 2014-10-29: 10 ug/min via INTRAVENOUS

## 2014-10-29 MED ORDER — FENTANYL CITRATE (PF) 100 MCG/2ML IJ SOLN
INTRAMUSCULAR | Status: AC
Start: 2014-10-29 — End: 2014-10-29
  Filled 2014-10-29: qty 4

## 2014-10-29 MED ORDER — SODIUM CHLORIDE 0.9 % WEIGHT BASED INFUSION: 1.0000 mL/kg/h | INTRAVENOUS | Status: AC

## 2014-10-29 MED ORDER — HEPARIN (PORCINE) IN NACL 2-0.9 UNIT/ML-% IJ SOLN
INTRAMUSCULAR | Status: AC
  Filled 2014-10-29: qty 1000

## 2014-10-29 MED ORDER — ENOXAPARIN SODIUM 40 MG/0.4ML ~~LOC~~ SOLN
40.0000 mg | SUBCUTANEOUS | Status: DC
  Administered 2014-10-30 – 2014-11-01 (×3): 40 mg via SUBCUTANEOUS
  Filled 2014-10-29 (×3): qty 0.4

## 2014-10-29 MED ORDER — OXYCODONE-ACETAMINOPHEN 5-325 MG PO TABS
1.0000 | ORAL_TABLET | ORAL | Status: DC | PRN
  Administered 2014-10-29 – 2014-10-31 (×3): 2 via ORAL
  Filled 2014-10-29 (×2): qty 2

## 2014-10-29 MED ORDER — HEPARIN (PORCINE) IN NACL 2-0.9 UNIT/ML-% IJ SOLN
INTRAMUSCULAR | Status: DC | PRN
  Administered 2014-10-29: 10:00:00 via INTRA_ARTERIAL

## 2014-10-29 MED ORDER — NITROGLYCERIN IN D5W 200-5 MCG/ML-% IV SOLN: 20.0000 ug/min | INTRAVENOUS | Status: AC

## 2014-10-29 MED ORDER — SODIUM CHLORIDE 0.9 % IV SOLN: INTRAVENOUS | Status: DC

## 2014-10-29 MED ORDER — LIDOCAINE HCL (PF) 1 % IJ SOLN
INTRAMUSCULAR | Status: AC
  Filled 2014-10-29: qty 30

## 2014-10-29 MED ORDER — HEPARIN SODIUM (PORCINE) 1000 UNIT/ML IJ SOLN
INTRAMUSCULAR | Status: DC | PRN
  Administered 2014-10-29: 5000 [IU] via INTRAVENOUS

## 2014-10-29 MED ORDER — FENTANYL CITRATE (PF) 100 MCG/2ML IJ SOLN
INTRAMUSCULAR | Status: AC
  Filled 2014-10-29: qty 4

## 2014-10-29 MED ORDER — BIVALIRUDIN 250 MG IV SOLR
INTRAVENOUS | Status: AC
  Filled 2014-10-29: qty 250

## 2014-10-29 MED ORDER — SODIUM CHLORIDE 0.9 % IJ SOLN
3.0000 mL | Freq: Two times a day (BID) | INTRAMUSCULAR | Status: DC
  Administered 2014-10-30 – 2014-11-01 (×4): 3 mL via INTRAVENOUS

## 2014-10-29 MED ORDER — LIDOCAINE HCL (PF) 1 % IJ SOLN
INTRAMUSCULAR | Status: DC | PRN
  Administered 2014-10-29: 5 mL

## 2014-10-29 MED ORDER — SODIUM CHLORIDE 0.9 % IJ SOLN
3.0000 mL | Freq: Two times a day (BID) | INTRAMUSCULAR | Status: DC
  Administered 2014-10-29: 3 mL via INTRAVENOUS

## 2014-10-29 MED ORDER — IOHEXOL 350 MG/ML SOLN
INTRAVENOUS | Status: DC | PRN
  Administered 2014-10-29: 240 mL via INTRA_ARTERIAL

## 2014-10-29 MED ORDER — NITROGLYCERIN 0.4 MG SL SUBL: 0.4000 mg | SUBLINGUAL_TABLET | SUBLINGUAL | Status: DC | PRN

## 2014-10-29 MED ORDER — HEPARIN (PORCINE) IN NACL 2-0.9 UNIT/ML-% IJ SOLN
INTRAMUSCULAR | Status: DC | PRN
  Administered 2014-10-29: 10:00:00

## 2014-10-29 MED ORDER — OXYCODONE-ACETAMINOPHEN 5-325 MG PO TABS
ORAL_TABLET | ORAL | Status: AC
  Filled 2014-10-29: qty 2

## 2014-10-29 MED ORDER — TICAGRELOR 90 MG PO TABS
90.0000 mg | ORAL_TABLET | Freq: Two times a day (BID) | ORAL | Status: DC
  Administered 2014-10-29 – 2014-11-01 (×6): 90 mg via ORAL
  Filled 2014-10-29 (×6): qty 1

## 2014-10-29 MED ORDER — ONDANSETRON HCL 4 MG/2ML IJ SOLN
4.0000 mg | Freq: Four times a day (QID) | INTRAMUSCULAR | Status: DC | PRN
  Administered 2014-10-29: 4 mg via INTRAVENOUS
  Filled 2014-10-29: qty 2

## 2014-10-29 MED ORDER — NITROGLYCERIN 2 % TD OINT
0.5000 [in_us] | TOPICAL_OINTMENT | Freq: Once | TRANSDERMAL | Status: AC
  Administered 2014-10-29: 0.5 [in_us] via TOPICAL
  Filled 2014-10-29: qty 1

## 2014-10-29 MED ORDER — VERAPAMIL HCL 2.5 MG/ML IV SOLN
INTRAVENOUS | Status: AC
  Filled 2014-10-29: qty 2

## 2014-10-29 MED ORDER — ASPIRIN 81 MG PO CHEW
162.0000 mg | CHEWABLE_TABLET | Freq: Once | ORAL | Status: AC
  Administered 2014-10-29: 162 mg via ORAL
  Filled 2014-10-29: qty 2

## 2014-10-29 MED ORDER — PANTOPRAZOLE SODIUM 40 MG PO TBEC
40.0000 mg | DELAYED_RELEASE_TABLET | Freq: Every day | ORAL | Status: DC
  Administered 2014-10-29 – 2014-11-01 (×4): 40 mg via ORAL
  Filled 2014-10-29 (×4): qty 1

## 2014-10-29 MED ORDER — SODIUM CHLORIDE 0.9 % IV SOLN
250.0000 mg | INTRAVENOUS | Status: DC | PRN
  Administered 2014-10-29: 1.75 mg/kg/h via INTRAVENOUS

## 2014-10-29 MED ORDER — HEPARIN (PORCINE) IN NACL 100-0.45 UNIT/ML-% IJ SOLN
1200.0000 [IU]/h | INTRAMUSCULAR | Status: DC
  Administered 2014-10-29: 1200 [IU]/h via INTRAVENOUS
  Filled 2014-10-29 (×2): qty 250

## 2014-10-29 SURGICAL SUPPLY — 22 items
BALLN EMERGE MR 2.5X12 (BALLOONS) ×2
BALLN ~~LOC~~ EUPHORA RX 3.25X20 (BALLOONS) ×2
BALLOON EMERGE MR 2.5X12 (BALLOONS) ×1 IMPLANT
BALLOON ~~LOC~~ EUPHORA RX 3.25X20 (BALLOONS) ×1 IMPLANT
CATH INFINITI 5 FR JL3.5 (CATHETERS) IMPLANT
CATH INFINITI JR4 5F (CATHETERS) ×2 IMPLANT
CATH SITESEER 5F MULTI A 2 (CATHETERS) IMPLANT
CATH VISTA GUIDE 6FR XBLAD3.0 (CATHETERS) ×2 IMPLANT
DEVICE RAD COMP TR BAND LRG (VASCULAR PRODUCTS) ×2 IMPLANT
ELECT DEFIB PAD ADLT CADENCE (PAD) ×2 IMPLANT
GLIDESHEATH SLEND A-KIT 6F 22G (SHEATH) ×2 IMPLANT
KIT ENCORE 26 ADVANTAGE (KITS) ×2 IMPLANT
KIT HEART LEFT (KITS) ×2 IMPLANT
PACK CARDIAC CATHETERIZATION (CUSTOM PROCEDURE TRAY) ×2 IMPLANT
SHEATH PINNACLE 5F 10CM (SHEATH) IMPLANT
STENT SYNERGY DES 3X24 (Permanent Stent) ×2 IMPLANT
TRANSDUCER W/STOPCOCK (MISCELLANEOUS) ×2 IMPLANT
TUBING CIL FLEX 10 FLL-RA (TUBING) ×2 IMPLANT
WIRE ASAHI PROWATER 180CM (WIRE) ×2 IMPLANT
WIRE EMERALD 3MM-J .035X150CM (WIRE) IMPLANT
WIRE HI TORQ BMW 190CM (WIRE) ×2 IMPLANT
WIRE SAFE-T 1.5MM-J .035X260CM (WIRE) ×2 IMPLANT

## 2014-10-29 NOTE — Progress Notes (Signed)
Arrived to holding area w/Angiomax drip infusing at 31.9cc/hr and NTG drip infusing at 6cc/hr

## 2014-10-29 NOTE — Progress Notes (Signed)
Ate [place] sandwich. Denies chest discomfort. Waiting on TCU bed assignment.

## 2014-10-29 NOTE — ED Provider Notes (Signed)
CSN: [id number]     Arrival date & time 10/29/14  9509 History  By signing my name below, I, Evelene Croon, attest that this documentation has been prepared under the direction and in the presence of Sharlett Iles, MD . Electronically Signed: Evelene Croon, Scribe. 10/29/2014. 4:00 AM.  Chief Complaint  Patient presents with  . Chest Pain  . Shortness of Breath    The history is provided by the patient. No language interpreter was used.    HPI Comments:  Vincent Flores is a 64 y.o. male who presents to the Emergency Department complaining of burning central CP that began ~0230 this am while seated. Pt notes his pain radiates into his back. He reports associated SOB and mild diaphoresis. No sudden onset of severe chest pain or ripping/tearing pain. He has taken Prevacid and 81 mg ASA with minimal relief PTA.  He denies PMHx of coronary stents, blood clots, and family h/o heart disease. He also denies recent illness- cough, fever, abdominal pain, blood in stool, use of blood thinners and long periods of immobilization. He is a former smoker . Past Medical History  Diagnosis Date  . HTN (hypertension)   . Hyperlipidemia   . Shortness of breath   . Abnormal EKG    Past Surgical History  Procedure Laterality Date  . Appendectomy  1967  . Transthoracic echocardiogram  [id number]    normal global wall motion.  normal systolic global function.  calculated EF 61 %   Family History  Problem Relation Age of Onset  . Heart disease Mother   . Cancer Father     Lung, was a smoker   Social History  Substance Use Topics  . Smoking status: Former Research scientist (life sciences)  . Smokeless tobacco: Never Used  . Alcohol Use: 10.5 oz/week    21 drink(s) per week    Review of Systems  10 systems reviewed and all are negative for acute change except as noted in the HPI.  Allergies  Review of patient's allergies indicates no known allergies.  Home Medications   Prior to Admission medications   Medication  Sig Start Date End Date Taking? Authorizing Provider  aspirin EC 81 MG tablet Take 81 mg by mouth daily.   Yes Historical Provider, MD  lansoprazole (PREVACID) 30 MG capsule Take 1 capsule (30 mg total) by mouth daily at 12 noon. 12/04/13  Yes Thao P Le, DO  amLODipine (NORVASC) 10 MG tablet Take 1 tablet (10 mg total) by mouth daily. Patient not taking: Reported on 10/29/2014 12/04/13   Thao P Le, DO  lisinopril (PRINIVIL,ZESTRIL) 10 MG tablet Take 1 tablet (10 mg total) by mouth daily. Patient not taking: Reported on 10/29/2014 12/04/13   Thao P Le, DO  rosuvastatin (CRESTOR) 10 MG tablet Take 1/2 tab po daily qhs Patient not taking: Reported on 10/29/2014 12/04/13   Thao P Le, DO   BP 141/96 mmHg  Pulse 71  Temp(Src) 97.5 F (36.4 C) (Oral)  Resp 13  Ht 5\' 6"  (1.676 m)  Wt 200 lb (90.719 kg)  BMI 32.30 kg/m2  SpO2 98% Physical Exam  Constitutional: He is oriented to person, place, and time. He appears well-developed and well-nourished.  Uncomfortable appearing Mild distress  HENT:  Head: Normocephalic and atraumatic.  Moist mucous membranes  Eyes: Conjunctivae are normal. Pupils are equal, round, and reactive to light.  Neck: Neck supple.  Cardiovascular: Normal rate, regular rhythm, normal heart sounds and intact distal pulses.   No murmur heard. Pulmonary/Chest:  Effort normal and breath sounds normal.  Abdominal: Soft. Bowel sounds are normal. He exhibits no distension. There is no tenderness.  Musculoskeletal: He exhibits no edema.  Neurological: He is alert and oriented to person, place, and time.  Fluent speech  Skin: Skin is warm. He is diaphoretic.  Psychiatric: He has a normal mood and affect. Judgment normal.  Nursing note and vitals reviewed.   ED Course  .Critical Care Performed by: Sharlett Iles Authorized by: Sharlett Iles Total critical care time: 40 minutes Critical care time was exclusive of separately billable procedures and treating  other patients. Critical care was necessary to treat or prevent imminent or life-threatening deterioration of the following conditions: cardiac failure. Critical care was time spent personally by me on the following activities: development of treatment plan with patient or surrogate, discussions with consultants, evaluation of patient's response to treatment, examination of patient, obtaining history from patient or surrogate, ordering and performing treatments and interventions, ordering and review of laboratory studies, ordering and review of radiographic studies and re-evaluation of patient's condition.     DIAGNOSTIC STUDIES:  Oxygen Saturation is 100% on RA, normal by my interpretation.    COORDINATION OF CARE:  3:47 AM Will order CXR, nitro and lab work.Discussed treatment plan with pt at bedside and pt agreed to plan.  Labs Review Labs Reviewed  BASIC METABOLIC PANEL - Abnormal; Notable for the following:    Glucose, Bld 135 (*)    All other components within normal limits  CBC - Abnormal; Notable for the following:    MCH 35.5 (*)    All other components within normal limits  I-STAT TROPOININ, ED - Abnormal; Notable for the following:    Troponin i, poc 2.17 (*)    All other components within normal limits  HEPARIN LEVEL (UNFRACTIONATED)  Randolm Idol, ED    Imaging Review Dg Chest 2 View  10/29/2014  CLINICAL DATA:  Initial evaluation for acute chest pain. History of hypertension. Prior smoker. EXAM: CHEST  2 VIEW COMPARISON:  Prior radiograph from 10/18/2011. FINDINGS: Cardiac and mediastinal silhouettes are stable in size and contour, and remain within normal limits. Lungs are normally inflated. No focal infiltrate to suggest pneumonia. There is mild diffuse peribronchial thickening and prominence of the interstitial markings, which may reflect atypical lung infection or possibly bronchiolitis. No overt pulmonary edema. No pleural effusion. No acute osseus abnormality.  Multilevel degenerative changes present within the visualized spine. IMPRESSION: 1. Mild diffuse peribronchial thickening with prominence of the interstitial markings. Findings are nonspecific, and may reflect sequela of atypical infection and/or bronchiolitis. Possible mild and/or developing pulmonary interstitial edema could also have this appearance. No focal infiltrates to suggest pneumonia. 2. No other active cardiopulmonary disease Electronically Signed   By: Jeannine Boga M.D.   On: 10/29/2014 04:48   Ct Angio Chest Aorta W/cm &/or Wo/cm  10/29/2014  CLINICAL DATA:  Acute onset of right central chest pain, radiating to the back. Shortness of breath. Initial encounter. EXAM: CT ANGIOGRAPHY CHEST WITH CONTRAST TECHNIQUE: Multidetector CT imaging of the chest was performed using the standard protocol during bolus administration of intravenous contrast. Multiplanar CT image reconstructions and MIPs were obtained to evaluate the vascular anatomy. CONTRAST:  163mL OMNIPAQUE IOHEXOL 350 MG/ML SOLN COMPARISON:  Chest radiograph performed earlier today at 4:35 a.m. FINDINGS: There is no evidence of aortic dissection. There is no evidence of aneurysmal dilatation. The great vessels are grossly unremarkable in appearance. No significant calcific atherosclerotic disease is seen. There  is no evidence of pulmonary embolus. Scattered blebs are noted at the lung apices. The lungs are otherwise clear. There is no evidence of significant focal consolidation, pleural effusion or pneumothorax. No masses are identified; no abnormal focal contrast enhancement is seen. Scattered coronary artery calcifications are seen. The mediastinum is otherwise unremarkable. No mediastinal lymphadenopathy is seen. No pericardial effusion is identified. No axillary lymphadenopathy is seen. The visualized portions of the thyroid gland are unremarkable in appearance. A 5.1 cm cyst is noted within the right hepatic lobe. The spleen,  gallbladder, adrenal glands, and visualized portions of the pancreas are grossly unremarkable. Nonspecific perinephric stranding is noted bilaterally. No acute osseous abnormalities are seen. Anterior bridging osteophytes are seen along the lower thoracic spine. Review of the MIP images confirms the above findings. IMPRESSION: 1. No evidence of aortic dissection. No evidence of aneurysmal dilatation. 2. No evidence of pulmonary embolus. 3. Scattered blebs at the lung apices.  Lungs otherwise clear. 4. Scattered coronary artery calcifications seen. 5. 5.1 cm right hepatic lobe cyst noted. Electronically Signed   By: Garald Balding M.D.   On: 10/29/2014 05:48   I have personally reviewed and evaluated these images and lab results as part of my medical decision-making.   EKG Interpretation   Date/Time:  Friday October 29 2014 03:22:01 EDT Ventricular Rate:  78 PR Interval:  130 QRS Duration: 84 QT Interval:  392 QTC Calculation: 446 R Axis:   62 Text Interpretation:  Normal sinus rhythm Normal ECG Confirmed by LITTLE  MD, RACHEL ([id number]) on 10/29/2014 3:57:11 AM     Medications  heparin ADULT infusion 100 units/mL (25000 units/250 mL) (1,200 Units/hr Intravenous New Bag/Given 10/29/14 0736)  aspirin chewable tablet 162 mg (162 mg Oral Given 10/29/14 0357)  aspirin chewable tablet 81 mg (81 mg Oral Given 10/29/14 0357)  nitroGLYCERIN (NITROGLYN) 2 % ointment 0.5 inch (0.5 inches Topical Given 10/29/14 0357)  iohexol (OMNIPAQUE) 350 MG/ML injection 100 mL (100 mLs Intravenous Contrast Given 10/29/14 0525)  heparin bolus via infusion 4,000 Units (4,000 Units Intravenous Given 10/29/14 0737)    MDM   Final diagnoses:  NSTEMI (non-ST elevated myocardial infarction) [facility])   64 year old male who presents with chest pain, shortness of breath, and diaphoresis that began just prior to arrival. Patient uncomfortable and diaphoretic at presentation. He was hypertensive at 183/114. Patient had RAD  take 81 mg aspirin, gave ASA to total 324mg  and 1/2" nitropaste. EKG on arrival showed borderline ST elevation in precordial leads but no reciprocal changes. Obtained above labs which showed a normal initial troponin. Chest x-ray with question of mild diffuse peribronchial thickening but no widened mediastinum. Because of the patient's significant pain and pain radiating to his back, obtained a CTA to rule out aortic dissection. CTA was unremarkable. Repeat EKG has showed no ST changes. I spoke with cardiology, who will evaluate the patient. Shortly after discussion, the patient's repeat troponin came back at 2. Updated pt and family on findings and plan. Initiated heparin per ACS protocol. Patient will be admitted to cardiology for further care.  .  I personally performed the services described in this documentation, which was scribed in my presence. The recorded information has been reviewed and is accurate.    Sharlett Iles, MD 10/29/14 [phone]

## 2014-10-29 NOTE — Progress Notes (Signed)
Angiomax drip completed and turned off.

## 2014-10-29 NOTE — Progress Notes (Signed)
12-lead EKG done; Dr. Tamala Julian in and looked at it. Aware that patient has 1/10 chest discomfort. Family in to see.

## 2014-10-29 NOTE — Progress Notes (Signed)
CRITICAL VALUE ALERT  Critical value received: Troponin  Date of notification:  10/29/14  Time of notification:  1706  Critical value read back: yes  Nurse who received alert:  Heide Guile RN  MD notified (1st page): Ellen Henri NP   Time of first page:  1745  MD notified (2nd page):  Time of second page:  Responding MD:  No changes  Time MD responded:  1750

## 2014-10-29 NOTE — ED Notes (Signed)
Cardiology at bedside.

## 2014-10-29 NOTE — ED Notes (Signed)
Took 1 81mg  ASA PTA

## 2014-10-29 NOTE — Progress Notes (Signed)
ANTICOAGULATION CONSULT NOTE - Initial Consult  Pharmacy Consult for heparin Indication: chest pain/ACS  No Known Allergies  Patient Measurements: Height: 5\' 6"  (167.6 cm) Weight: 200 lb (90.719 kg) IBW/kg (Calculated) : 63.8 Heparin Dosing Weight: 85kg  Vital Signs: Temp: 97.5 F (36.4 C) (10/21 0317) Temp Source: Oral (10/21 0317) BP: 138/93 mmHg (10/21 0630) Pulse Rate: 78 (10/21 0630)  Labs:  Recent Labs  10/29/14 0347  HGB 17.0  HCT 47.2  PLT 201  CREATININE 1.16    Estimated Creatinine Clearance: 68.8 mL/min (by C-G formula based on Cr of 1.16).   Medical History: Past Medical History  Diagnosis Date  . HTN (hypertension)   . Hyperlipidemia   . Shortness of breath   . Abnormal EKG      Assessment: 64yo male c/o CP that radiates to back and associated w/ SOB to begin heparin.  Goal of Therapy:  Heparin level 0.3-0.7 units/ml Monitor platelets by anticoagulation protocol: Yes   Plan:  Will give heparin 4000 units IV bolus x1 followed by gtt at 1200 units/hr and monitor heparin levels and CBC.  Wynona Neat, PharmD, BCPS  10/29/2014,7:00 AM

## 2014-10-29 NOTE — H&P (Signed)
Patient ID: Vincent Flores MRN: [id number], DOB/AGE: 63-Nov-1952   Admit date: 10/29/2014   Primary Physician: No PCP Per Patient Primary Cardiologist: New (Dr. Claiborne Billings)  Pt. Profile:  64 year old male with no prior cardiac history but with multiple cardiac risk factors including a 40+ history of prior tobacco use, untreated hypertension and hyperlipidemia presenting to the [facility] ED with symptoms of unstable angina. Has now ruled in for non-STEMI.   Problem List  Past Medical History  Diagnosis Date  . HTN (hypertension)   . Hyperlipidemia   . Shortness of breath   . Abnormal EKG     Past Surgical History  Procedure Laterality Date  . Appendectomy  1967  . Transthoracic echocardiogram  [id number]    normal global wall motion.  normal systolic global function.  calculated EF 61 %     Allergies  No Known Allergies  HPI  The patient is a 64 year old male with no prior cardiac history but with multiple cardiac risk factors including prior history of hypertension, dyslipidemia and tobacco abuse. He is not followed regularly by a PCP. He usually seeks medical evaluation at an urgent care facility as needed. He notes a 40+ year history of tobacco abuse but quit 4 years ago. He also discontinued his medications for his blood pressure and cholesterol at at that time as he reports his levels were better controlled after discontinuation of smoking. He denies any personal history of diabetes. Denies any family history of cardiac disease, MI or sudden cardiac death.   He reports that he was seen by Dr. Einar Gip in 2013 after he was discovered to have a slightly abnormal EKG. However he reports that he underwent a stress test and echocardiogram that were both normal. He states that Dr. Einar Gip told him that he would not need to continue routine follow-up. He has not been seen by a cardiologist since that time as he has had no need.  He continues to work. He paints houses for a living. He reports  that over the last several weeks he has experienced exertional chest pain characteristic of stable angina. He notes substernal chest pressure/burning with exertion that would resolve quickly with rest. However, around 3 AM this morning he developed unstable angina. He noted nocturnal substernal chest burning radiating to his neck and back. It woke him from his sleep. The discomfort was severe, 10/10 in intensity. He also had mild dyspnea and diaphoresis but denies any nausea, vomiting, syncope/near-syncope. The severity of his discomfort prompted him to seek emergency medical attention.  On arrival to the ED, EKG demonstrated minimal ST elevation in the anterior leads. Repeat EKG shows slight T-wave inversions in leads 1, aVL and V2. POC troponin is elevated at 2.17. CT of the chest is negative for aortic dissection. There is no evidence of aneurysmal dilatation. No evidence for PE. Scattered coronary artery calcifications are seen. He has been started on IV heparin and Nitropaste. This chest discomfort has significantly improved, now down to 2/10. Vital signs are stable. Renal function is normal. Serum creatinine is 1.16.   Home Medications  Prior to Admission medications   Medication Sig Start Date End Date Taking? Authorizing Provider  aspirin EC 81 MG tablet Take 81 mg by mouth daily.   Yes Historical Provider, MD  lansoprazole (PREVACID) 30 MG capsule Take 1 capsule (30 mg total) by mouth daily at 12 noon. 12/04/13  Yes Thao P Le, DO  amLODipine (NORVASC) 10 MG tablet Take 1 tablet (10 mg total)  by mouth daily. Patient not taking: Reported on 10/29/2014 12/04/13   Thao P Le, DO  lisinopril (PRINIVIL,ZESTRIL) 10 MG tablet Take 1 tablet (10 mg total) by mouth daily. Patient not taking: Reported on 10/29/2014 12/04/13   Thao P Le, DO  rosuvastatin (CRESTOR) 10 MG tablet Take 1/2 tab po daily qhs Patient not taking: Reported on 10/29/2014 12/04/13   Thao P Le, DO    Family History  Family  History  Problem Relation Age of Onset  . Heart disease Mother   . Cancer Father     Lung, was a smoker    Social History  Social History   Social History  . Marital Status: Married    Spouse Name: N/A  . Number of Children: N/A  . Years of Education: N/A   Occupational History  . Not on file.   Social History Main Topics  . Smoking status: Former Research scientist (life sciences)  . Smokeless tobacco: Never Used  . Alcohol Use: 10.5 oz/week    21 drink(s) per week  . Drug Use: No  . Sexual Activity:    Partners: Female   Other Topics Concern  . Not on file   Social History Narrative     Review of Systems General:  No chills, fever, night sweats or weight changes.  Cardiovascular:  No chest pain, dyspnea on exertion, edema, orthopnea, palpitations, paroxysmal nocturnal dyspnea. Dermatological: No rash, lesions/masses Respiratory: No cough, dyspnea Urologic: No hematuria, dysuria Abdominal:   No nausea, vomiting, diarrhea, bright red blood per rectum, melena, or hematemesis Neurologic:  No visual changes, wkns, changes in mental status. All other systems reviewed and are otherwise negative except as noted above.  Physical Exam  Blood pressure 141/96, pulse 71, temperature 97.5 F (36.4 C), temperature source Oral, resp. rate 13, height 5\' 6"  (1.676 m), weight 200 lb (90.719 kg), SpO2 98 %.  General: Pleasant, NAD Psych: Normal affect. Neuro: Alert and oriented X 3. Moves all extremities spontaneously. HEENT: Normal  Neck: Supple without bruits or JVD. Lungs:  Resp regular and unlabored, CTA. Heart: RRR no s3, s4, or murmurs. Abdomen: Soft, non-tender, non-distended, BS + x 4. obese Extremities: No clubbing, cyanosis or edema. DP/PT/Radials 2+ and equal bilaterally.  Labs  Troponin [facility] Test)  Recent Labs  10/29/14 0644  TROPIPOC 2.17*   No results for input(s): CKTOTAL, CKMB, TROPONINI in the last 72 hours. Lab Results  Component Value Date   WBC 9.1 10/29/2014    HGB 17.0 10/29/2014   HCT 47.2 10/29/2014   MCV 98.5 10/29/2014   PLT 201 10/29/2014    Recent Labs Lab 10/29/14 0347  NA 141  K 3.5  CL 106  CO2 24  BUN 12  CREATININE 1.16  CALCIUM 10.1  GLUCOSE 135*   Lab Results  Component Value Date   CHOL 206* 12/04/2013   HDL 46 12/04/2013   LDLCALC 137* 12/04/2013   TRIG 113 12/04/2013   No results found for: DDIMER   Radiology/Studies  Dg Chest 2 View  10/29/2014  CLINICAL DATA:  Initial evaluation for acute chest pain. History of hypertension. Prior smoker. EXAM: CHEST  2 VIEW COMPARISON:  Prior radiograph from 10/18/2011. FINDINGS: Cardiac and mediastinal silhouettes are stable in size and contour, and remain within normal limits. Lungs are normally inflated. No focal infiltrate to suggest pneumonia. There is mild diffuse peribronchial thickening and prominence of the interstitial markings, which may reflect atypical lung infection or possibly bronchiolitis. No overt pulmonary edema. No pleural effusion.  No acute osseus abnormality. Multilevel degenerative changes present within the visualized spine. IMPRESSION: 1. Mild diffuse peribronchial thickening with prominence of the interstitial markings. Findings are nonspecific, and may reflect sequela of atypical infection and/or bronchiolitis. Possible mild and/or developing pulmonary interstitial edema could also have this appearance. No focal infiltrates to suggest pneumonia. 2. No other active cardiopulmonary disease Electronically Signed   By: Jeannine Boga M.D.   On: 10/29/2014 04:48   Ct Angio Chest Aorta W/cm &/or Wo/cm  10/29/2014  CLINICAL DATA:  Acute onset of right central chest pain, radiating to the back. Shortness of breath. Initial encounter. EXAM: CT ANGIOGRAPHY CHEST WITH CONTRAST TECHNIQUE: Multidetector CT imaging of the chest was performed using the standard protocol during bolus administration of intravenous contrast. Multiplanar CT image reconstructions and MIPs  were obtained to evaluate the vascular anatomy. CONTRAST:  161mL OMNIPAQUE IOHEXOL 350 MG/ML SOLN COMPARISON:  Chest radiograph performed earlier today at 4:35 a.m. FINDINGS: There is no evidence of aortic dissection. There is no evidence of aneurysmal dilatation. The great vessels are grossly unremarkable in appearance. No significant calcific atherosclerotic disease is seen. There is no evidence of pulmonary embolus. Scattered blebs are noted at the lung apices. The lungs are otherwise clear. There is no evidence of significant focal consolidation, pleural effusion or pneumothorax. No masses are identified; no abnormal focal contrast enhancement is seen. Scattered coronary artery calcifications are seen. The mediastinum is otherwise unremarkable. No mediastinal lymphadenopathy is seen. No pericardial effusion is identified. No axillary lymphadenopathy is seen. The visualized portions of the thyroid gland are unremarkable in appearance. A 5.1 cm cyst is noted within the right hepatic lobe. The spleen, gallbladder, adrenal glands, and visualized portions of the pancreas are grossly unremarkable. Nonspecific perinephric stranding is noted bilaterally. No acute osseous abnormalities are seen. Anterior bridging osteophytes are seen along the lower thoracic spine. Review of the MIP images confirms the above findings. IMPRESSION: 1. No evidence of aortic dissection. No evidence of aneurysmal dilatation. 2. No evidence of pulmonary embolus. 3. Scattered blebs at the lung apices.  Lungs otherwise clear. 4. Scattered coronary artery calcifications seen. 5. 5.1 cm right hepatic lobe cyst noted. Electronically Signed   By: Garald Balding M.D.   On: 10/29/2014 05:48    ECG  TWI in leads I, AVL and V2. NSR   ASSESSMENT AND PLAN  1. NSTEMI: Patient with unstable angina and an elevated troponin of 2.17. His pain is improved with the addition of Nitropaste and IV heparin, now at a 2/10. His vital signs including his  heart rate and blood pressure are stable. Renal function is normal. We'll make plans for a diagnostic left heart catheterization +/- PCI today. Keep NPO. We'll need to initiate high-dose statin therapy and low-dose beta blocker therapy if heart rate tolerates. Will obtain a 2D echo.   2. H/o HTN: Patient self discontinued his antihypertensives after he quit smoking 4 years ago. He reports his pressures have been fairly well-controlled since. His most recent blood pressure is 145/92. Given his acute coronary syndrome, he would benefit from the addition of a low-dose beta blocker. Metroprolol versus carvedilol depending on LV function assessment. Also possibly a low dose ACE/ARB, depending on BP.   3. History of hyperlipidemia: Patient self discontinued statin therapy several years ago. We'll check a fasting lipid panel and hepatic function test. For now, will place on high-dose statin therapy given his acute coronary syndrome.  4. Prior history of tobacco abuse: Patient denies any recurrent disease.  5. Additional Risk Factor Screening: The patient is not followed routinely by a primary care physician. Given his other risk factors of hypertension and hyperlipidemia and now high likelihood for coronary artery disease, we will check a hemoglobin A1c to screen for diabetes. He will need to establish care with a PCP for routine preventive care.    Signed, Lyda Jester, PA-C 10/29/2014, 7:50 AM   Patient seen and examined; agree with assessment above.  Mr. Vincent Flores is a very pleasant 64 year old male who has a prior 40 year history of tobacco use, a history of hyperlipidemia and hypertension.  He has not been followed readily by PCP and had seen Dr. Einar Gip once in 2013, but was told that point he did not need follow-up.  Recently, the patient has noticed exertional chest pain symptomatology.  This morning after awakening at 3 AM he developed rest chest tightness with radiation to his back  associated with mild diaphoresis and dyspnea.  He presented to the emergency room.  A chest CT was negative for PE and aortic dissection.  His initial ECG was interpreted as being normal.  Review by me does suggest possible early T-wave peaking and less than 0.5 mm J-point elevation in lead 3.  The patient's chest pain has improved with nitrate therapy and heparin, but he still has mild residual discomfort.  Troponin is positive for non-ST segment elevation MI at 2.17.  A subsequent ECG  shows resolution of early T-wave peaking.  I suspect there is lead reversal and V2, V3, but there now is a Q wave in lead 3.  It is my recommendation that definitive cardiac catheterization be performed this a.m.  I have discussed the cardiac catheterization and possible percutaneous coronary intervention procedure in detail including, but not limited to, death, stroke, MI, kidney damage and bleeding  with the patient and his family who agree to proceed with this study.   Troy Sine, MD  10/29/2014  9:16 AM

## 2014-10-29 NOTE — ED Notes (Signed)
Patient presents with c/o chest pain that is to the right to the center and through to his back  +SOB

## 2014-10-30 ENCOUNTER — Inpatient Hospital Stay (HOSPITAL_COMMUNITY): Payer: BLUE CROSS/BLUE SHIELD

## 2014-10-30 DIAGNOSIS — R9431 Abnormal electrocardiogram [ECG] [EKG]: Secondary | ICD-10-CM

## 2014-10-30 DIAGNOSIS — R079 Chest pain, unspecified: Secondary | ICD-10-CM

## 2014-10-30 LAB — BASIC METABOLIC PANEL
Anion gap: 7 (ref 5–15)
BUN: 9 mg/dL (ref 6–20)
CO2: 23 mmol/L (ref 22–32)
Calcium: 8.1 mg/dL — ABNORMAL LOW (ref 8.9–10.3)
Chloride: 102 mmol/L (ref 101–111)
Creatinine, Ser: 0.92 mg/dL (ref 0.61–1.24)
GFR calc Af Amer: >60 mL/min (ref >60)
GFR calc non Af Amer: >60 mL/min (ref >60)
Glucose, Bld: 114 mg/dL — ABNORMAL HIGH (ref 65–99)
Potassium: 5 mmol/L (ref 3.5–5.1)
Sodium: 132 mmol/L — ABNORMAL LOW (ref 135–145)

## 2014-10-30 LAB — CBC
HCT: 39.7 % (ref 39.0–52.0)
Hemoglobin: 13.7 g/dL (ref 13.0–17.0)
MCH: 34.2 pg — ABNORMAL HIGH (ref 26.0–34.0)
MCHC: 34.5 g/dL (ref 30.0–36.0)
MCV: 99 fL (ref 78.0–100.0)
Platelets: 157 10*3/uL (ref 150–400)
RBC: 4.01 MIL/uL — ABNORMAL LOW (ref 4.22–5.81)
RDW: 12.5 % (ref 11.5–15.5)
WBC: 11.7 10*3/uL — ABNORMAL HIGH (ref 4.0–10.5)

## 2014-10-30 LAB — TROPONIN I: Troponin I: 7.89 ng/mL (ref <0.031)

## 2014-10-30 LAB — LIPID PANEL
Cholesterol: 211 mg/dL — ABNORMAL HIGH (ref 0–200)
HDL: 38 mg/dL — ABNORMAL LOW (ref >40)
LDL Cholesterol: 146 mg/dL — ABNORMAL HIGH (ref 0–99)
Total CHOL/HDL Ratio: 5.6 RATIO
Triglycerides: 134 mg/dL (ref <150)
VLDL: 27 mg/dL (ref 0–40)

## 2014-10-30 LAB — HEMOGLOBIN A1C
Hgb A1c MFr Bld: 5.7 % — ABNORMAL HIGH (ref 4.8–5.6)
Mean Plasma Glucose: 117 mg/dL

## 2014-10-30 MED ORDER — MAGNESIUM HYDROXIDE 400 MG/5ML PO SUSP
30.0000 mL | Freq: Every day | ORAL | Status: DC | PRN
  Administered 2014-10-30: 30 mL via ORAL
  Filled 2014-10-30 (×2): qty 30

## 2014-10-30 NOTE — Progress Notes (Signed)
CARDIAC REHAB PHASE I   PRE:  Rate/Rhythm: 70 SR  BP:  Supine: 153/84  Sitting:   Standing:    SaO2:   MODE:  Ambulation: 350 ft   POST:  Rate/Rhythm: 93 SR  BP:  Supine:   Sitting: 155/78  Standing:    SaO2:  0905-1000 Pt walked 350 ft on RA with steady gait. No CP. Tolerated well. MI education completed with pt and wife except for diet. Left heart healthy diet for them to read. Discussed CRP 2 and referring to [place]. Gave brilint booklet and stressed importance of brilinta with stent. To recliner with call bell.   Graylon Good, RN BSN  10/30/2014 9:56 AM

## 2014-10-30 NOTE — Progress Notes (Signed)
EKG CRITICAL VALUE     12 lead EKG performed.  Critical value noted. Antionette Fairy, RN notified.   Asencion Gowda, CCT 10/30/2014 7:41 AM

## 2014-10-30 NOTE — Progress Notes (Signed)
Subjective:  Day 1 s/p MI  Objective:   Vital Signs : Filed Vitals:   10/30/14 0500 10/30/14 0600 10/30/14 0700 10/30/14 0713  BP: 112/68 131/63 131/90 131/90  Pulse: 64 58 68 65  Temp:    98.2 F (36.8 C)  TempSrc:    Oral  Resp: 10 10 10 11   Height:      Weight:      SpO2: 99% 99% 99% 99%    Intake/Output from previous day:  Intake/Output Summary (Last 24 hours) at 10/30/14 0832 Last data filed at 10/30/14 0600  Gross per 24 hour  Intake 244.98 ml  Output   1055 ml  Net -810.02 ml    I/O since admission: -810  Wt Readings from Last 3 Encounters:  10/29/14 203 lb 14.8 oz (92.5 kg)  12/04/13 197 lb (89.359 kg)  10/15/11 200 lb 12.8 oz (91.082 kg)    Medications: . aspirin  81 mg Oral Daily  . aspirin EC  81 mg Oral Daily  . atorvastatin  80 mg Oral q1800  . enoxaparin (LOVENOX) injection  40 mg Subcutaneous Q24H  . lisinopril  10 mg Oral Daily  . metoprolol tartrate  12.5 mg Oral BID  . pantoprazole  40 mg Oral Daily  . sodium chloride  3 mL Intravenous Q12H  . ticagrelor  90 mg Oral BID    . nitroGLYCERIN Stopped (10/30/14 5625)    Physical Exam:   General appearance: alert, cooperative and no distress Neck: no adenopathy, no carotid bruit, no JVD, supple, symmetrical, trachea midline and thyroid not enlarged, symmetric, no tenderness/mass/nodules Lungs: no rales or wheezing Heart: regular rate and rhythm Abdomen: soft, non-tender; bowel sounds normal; no masses,  no organomegaly Extremities: no edema, redness or tenderness in the calves or thighs Pulses: 2+ and symmetric; R radial cath site stable Skin: Skin color, texture, turgor normal. No rashes or lesions Neurologic: Grossly normal   Rate: 83  Rhythm: normal sinus rhythm  ECG (independently read by me): NSR at 65; Q in 3 aVF; marked T wave inversion V1-6 and I aVL.  Lab Results:   Recent Labs  10/29/14 0347 10/29/14 1558 10/30/14 0445  NA 141  --  132*  K 3.5  --  5.0  CL 106   --  102  CO2 24  --  23  GLUCOSE 135*  --  114*  BUN 12  --  9  CREATININE 1.16 1.10 0.92  CALCIUM 10.1  --  8.1*    Hepatic Function Latest Ref Rng 10/29/2014 12/04/2013 10/15/2011  Total Protein 6.5 - 8.1 g/dL 6.0(L) 6.8 6.8  Albumin 3.5 - 5.0 g/dL 3.4(L) 4.5 4.5  AST 15 - 41 U/L 110(H) 16 19  ALT 17 - 63 U/L 37 24 27  Alk Phosphatase 38 - 126 U/L 67 85 68  Total Bilirubin 0.3 - 1.2 mg/dL 0.7 0.4 0.6  Bilirubin, Direct 0.1 - 0.5 mg/dL 0.1 - -     Recent Labs  10/29/14 0347 10/29/14 1558 10/30/14 0445  WBC 9.1 9.5 11.7*  HGB 17.0 14.1 13.7  HCT 47.2 40.8 39.7  MCV 98.5 99.3 99.0  PLT 201 147* 157     Recent Labs  10/29/14 1558 10/29/14 2238 10/30/14 0445  TROPONINI 20.94* 13.19* 7.89*    Lab Results  Component Value Date   TSH 2.290 12/04/2013    Recent Labs  10/29/14 1558  HGBA1C 5.7*     Recent Labs  10/29/14 1558  PROT 6.0*  ALBUMIN 3.4*  AST 110*  ALT 37  ALKPHOS 67  BILITOT 0.7  BILIDIR 0.1  IBILI 0.6    Recent Labs  10/29/14 0827  INR 0.91   BNP (last 3 results) No results for input(s): BNP in the last 8760 hours.  ProBNP (last 3 results) No results for input(s): PROBNP in the last 8760 hours.   Lipid Panel     Component Value Date/Time   CHOL 211* 10/30/2014 0445   CHOL 206* 12/04/2013 1013   TRIG 134 10/30/2014 0445   HDL 38* 10/30/2014 0445   HDL 46 12/04/2013 1013   CHOLHDL 5.6 10/30/2014 0445   CHOLHDL 4.5 12/04/2013 1013   VLDL 27 10/30/2014 0445   LDLCALC 146* 10/30/2014 0445   LDLCALC 137* 12/04/2013 1013    Conclusion    1. Mid RCA lesion, 45% stenosed. 2. Dist RCA lesion, 60% stenosed. 3. 1st Mrg lesion, 60% stenosed. 4. Ost 1st Mrg lesion, 50% stenosed. 5. Prox Cx to Dist Cx lesion, 30% stenosed. 6. Mid LAD lesion, 100% stenosed.   Non-ST elevation myocardial infarction with ongoing chest discomfort on presentation and elevated markers.  Total occlusion of the proximal to mid LAD with both right  to left and left to left collaterals to the LAD, producing the non-STEMI presentation rather than ST elevation infarction.  Successful PTCA and stenting of the LAD from 100% to 0% with TIMI grade 3 flow. Final post PCI balloon diameter 3.25 mm.   Moderate mid and distal right coronary, and first obtuse marginal.  Left ventricular systolic dysfunction with apical moderate hypokinesis. EF 50%.   RECOMMENDATIONS:   Dual antiplatelet therapy  IV nitroglycerin for least 24 hours  ACE inhibitor therapy and initiation of beta blocker therapy  Opiates is for pain control  Potential 48 hour discharge assuming no significant arrhythmias or other acute complications.     Imaging:  Dg Chest 2 View  10/29/2014  CLINICAL DATA:  Initial evaluation for acute chest pain. History of hypertension. Prior smoker. EXAM: CHEST  2 VIEW COMPARISON:  Prior radiograph from 10/18/2011. FINDINGS: Cardiac and mediastinal silhouettes are stable in size and contour, and remain within normal limits. Lungs are normally inflated. No focal infiltrate to suggest pneumonia. There is mild diffuse peribronchial thickening and prominence of the interstitial markings, which may reflect atypical lung infection or possibly bronchiolitis. No overt pulmonary edema. No pleural effusion. No acute osseus abnormality. Multilevel degenerative changes present within the visualized spine. IMPRESSION: 1. Mild diffuse peribronchial thickening with prominence of the interstitial markings. Findings are nonspecific, and may reflect sequela of atypical infection and/or bronchiolitis. Possible mild and/or developing pulmonary interstitial edema could also have this appearance. No focal infiltrates to suggest pneumonia. 2. No other active cardiopulmonary disease Electronically Signed   By: Jeannine Boga M.D.   On: 10/29/2014 04:48   Ct Angio Chest Aorta W/cm &/or Wo/cm  10/29/2014  CLINICAL DATA:  Acute onset of right central chest  pain, radiating to the back. Shortness of breath. Initial encounter. EXAM: CT ANGIOGRAPHY CHEST WITH CONTRAST TECHNIQUE: Multidetector CT imaging of the chest was performed using the standard protocol during bolus administration of intravenous contrast. Multiplanar CT image reconstructions and MIPs were obtained to evaluate the vascular anatomy. CONTRAST:  131m OMNIPAQUE IOHEXOL 350 MG/ML SOLN COMPARISON:  Chest radiograph performed earlier today at 4:35 a.m. FINDINGS: There is no evidence of aortic dissection. There is no evidence of aneurysmal dilatation. The great vessels are grossly unremarkable in appearance. No significant calcific atherosclerotic disease  is seen. There is no evidence of pulmonary embolus. Scattered blebs are noted at the lung apices. The lungs are otherwise clear. There is no evidence of significant focal consolidation, pleural effusion or pneumothorax. No masses are identified; no abnormal focal contrast enhancement is seen. Scattered coronary artery calcifications are seen. The mediastinum is otherwise unremarkable. No mediastinal lymphadenopathy is seen. No pericardial effusion is identified. No axillary lymphadenopathy is seen. The visualized portions of the thyroid gland are unremarkable in appearance. A 5.1 cm cyst is noted within the right hepatic lobe. The spleen, gallbladder, adrenal glands, and visualized portions of the pancreas are grossly unremarkable. Nonspecific perinephric stranding is noted bilaterally. No acute osseous abnormalities are seen. Anterior bridging osteophytes are seen along the lower thoracic spine. Review of the MIP images confirms the above findings. IMPRESSION: 1. No evidence of aortic dissection. No evidence of aneurysmal dilatation. 2. No evidence of pulmonary embolus. 3. Scattered blebs at the lung apices.  Lungs otherwise clear. 4. Scattered coronary artery calcifications seen. 5. 5.1 cm right hepatic lobe cyst noted. Electronically Signed   By: Garald Balding M.D.   On: 10/29/2014 05:48      Assessment/Plan:   Active Problems:   Abnormal EKG   NSTEMI (non-ST elevated myocardial infarction) ([place])  1. NSTEMI due LAD occlusion with collaterals; s/p Synergy stent insertion to LAD;  Evolving ECG changes. Troponin 20.4.  2. H/o HTN 3. Hyperlipidemia: LDL 146, now on atorvastatin 80 mg daily.  Feeling well without recurrent chest pain post PCI to LAD.  Will titrate lopressor to 12.5 mg every 8 hrs today and plan to increase to 25 mg bid tomorrow. Tolerating lisinopril. On DAPT. Medical therapy for concomitant CAD. F/U ECG in am  Troy Sine, MD, [facility] 10/30/2014, 8:32 AM

## 2014-10-30 NOTE — Progress Notes (Signed)
  Echocardiogram 2D Echocardiogram has been performed.  Vincent Flores 10/30/2014, 11:49 AM

## 2014-10-31 ENCOUNTER — Other Ambulatory Visit: Payer: Self-pay

## 2014-10-31 MED ORDER — CARVEDILOL 6.25 MG PO TABS
6.2500 mg | ORAL_TABLET | Freq: Two times a day (BID) | ORAL | Status: DC
  Administered 2014-10-31 – 2014-11-01 (×2): 6.25 mg via ORAL
  Filled 2014-10-31 (×2): qty 1

## 2014-10-31 MED ORDER — ISOSORBIDE MONONITRATE ER 30 MG PO TB24
30.0000 mg | ORAL_TABLET | Freq: Every day | ORAL | Status: DC
  Administered 2014-10-31 – 2014-11-01 (×2): 30 mg via ORAL
  Filled 2014-10-31 (×2): qty 1

## 2014-10-31 NOTE — Progress Notes (Signed)
Report called to RN on unit 3 [place].  Family is at the bedside.  Patient will transfer to [place] 20

## 2014-10-31 NOTE — Progress Notes (Addendum)
Subjective:  Day 2 s/p MI; no recurrent chest pain  Objective:   Vital Signs : Filed Vitals:   10/31/14 0300 10/31/14 0350 10/31/14 0353 10/31/14 0706  BP: 128/82 153/97  161/92  Pulse:      Temp:   98.6 F (37 C) 98.1 F (36.7 C)  TempSrc:   Oral Oral  Resp:  16  16  Height:      Weight:      SpO2:  98%  99%    Intake/Output from previous day:  Intake/Output Summary (Last 24 hours) at 10/31/14 0847 Last data filed at 10/30/14 1900  Gross per 24 hour  Intake    800 ml  Output   1650 ml  Net   -850 ml    I/O since admission: -1657  Wt Readings from Last 3 Encounters:  10/29/14 203 lb 14.8 oz (92.5 kg)  12/04/13 197 lb (89.359 kg)  10/15/11 200 lb 12.8 oz (91.082 kg)    Medications: . aspirin  81 mg Oral Daily  . aspirin EC  81 mg Oral Daily  . atorvastatin  80 mg Oral q1800  . enoxaparin (LOVENOX) injection  40 mg Subcutaneous Q24H  . lisinopril  10 mg Oral Daily  . metoprolol tartrate  12.5 mg Oral BID  . pantoprazole  40 mg Oral Daily  . sodium chloride  3 mL Intravenous Q12H  . ticagrelor  90 mg Oral BID      Physical Exam:   General appearance: alert, cooperative and no distress Neck: no adenopathy, no carotid bruit, no JVD, supple, symmetrical, trachea midline and thyroid not enlarged, symmetric, no tenderness/mass/nodules Lungs: no rales or wheezing Heart: regular rate and rhythm Abdomen: soft, non-tender; bowel sounds normal; no masses,  no organomegaly Extremities: no edema, redness or tenderness in the calves or thighs Pulses: 2+ and symmetric; R radial cath site stable Skin: Skin color, texture, turgor normal. No rashes or lesions Neurologic: Grossly normal Psychologic: normal affect and mood   Rate: 87   Rhythm: normal sinus rhythm   10/31/14 ECG (independently read by me):  NSR at 86; Q waves 3 and aVF;  Continued marked T inversion slightly less pronounced than yesterday anteriorly and laterally.  10/30/14 ECG (independently read by  me): NSR at 65; Q in 3 aVF; marked T wave inversion V1-6 and I aVL.  Lab Results:   Recent Labs  10/29/14 0347 10/29/14 1558 10/30/14 0445  NA 141  --  132*  K 3.5  --  5.0  CL 106  --  102  CO2 24  --  23  GLUCOSE 135*  --  114*  BUN 12  --  9  CREATININE 1.16 1.10 0.92  CALCIUM 10.1  --  8.1*    Hepatic Function Latest Ref Rng 10/29/2014 12/04/2013 10/15/2011  Total Protein 6.5 - 8.1 g/dL 6.0(L) 6.8 6.8  Albumin 3.5 - 5.0 g/dL 3.4(L) 4.5 4.5  AST 15 - 41 U/L 110(H) 16 19  ALT 17 - 63 U/L 37 24 27  Alk Phosphatase 38 - 126 U/L 67 85 68  Total Bilirubin 0.3 - 1.2 mg/dL 0.7 0.4 0.6  Bilirubin, Direct 0.1 - 0.5 mg/dL 0.1 - -     Recent Labs  10/29/14 0347 10/29/14 1558 10/30/14 0445  WBC 9.1 9.5 11.7*  HGB 17.0 14.1 13.7  HCT 47.2 40.8 39.7  MCV 98.5 99.3 99.0  PLT 201 147* 157     Recent Labs  10/29/14 1558 10/29/14 2238 10/30/14 0445  TROPONINI 20.94* 13.19* 7.89*    Lab Results  Component Value Date   TSH 2.290 12/04/2013    Recent Labs  10/29/14 1558  HGBA1C 5.7*     Recent Labs  10/29/14 1558  PROT 6.0*  ALBUMIN 3.4*  AST 110*  ALT 37  ALKPHOS 67  BILITOT 0.7  BILIDIR 0.1  IBILI 0.6    Recent Labs  10/29/14 0827  INR 0.91   BNP (last 3 results) No results for input(s): BNP in the last 8760 hours.  ProBNP (last 3 results) No results for input(s): PROBNP in the last 8760 hours.   Lipid Panel     Component Value Date/Time   CHOL 211* 10/30/2014 0445   CHOL 206* 12/04/2013 1013   TRIG 134 10/30/2014 0445   HDL 38* 10/30/2014 0445   HDL 46 12/04/2013 1013   CHOLHDL 5.6 10/30/2014 0445   CHOLHDL 4.5 12/04/2013 1013   VLDL 27 10/30/2014 0445   LDLCALC 146* 10/30/2014 0445   LDLCALC 137* 12/04/2013 1013    Conclusion    1. Mid RCA lesion, 45% stenosed. 2. Dist RCA lesion, 60% stenosed. 3. 1st Mrg lesion, 60% stenosed. 4. Ost 1st Mrg lesion, 50% stenosed. 5. Prox Cx to Dist Cx lesion, 30% stenosed. 6. Mid LAD  lesion, 100% stenosed.   Non-ST elevation myocardial infarction with ongoing chest discomfort on presentation and elevated markers.  Total occlusion of the proximal to mid LAD with both right to left and left to left collaterals to the LAD, producing the non-STEMI presentation rather than ST elevation infarction.  Successful PTCA and stenting of the LAD from 100% to 0% with TIMI grade 3 flow. Final post PCI balloon diameter 3.25 mm.   Moderate mid and distal right coronary, and first obtuse marginal.  Left ventricular systolic dysfunction with apical moderate hypokinesis. EF 50%.   RECOMMENDATIONS:   Dual antiplatelet therapy  IV nitroglycerin for least 24 hours  ACE inhibitor therapy and initiation of beta blocker therapy  Opiates is for pain control  Potential 48 hour discharge assuming no significant arrhythmias or other acute complications.     Imaging:  10/30/14 ECHO Study Conclusions  - Left ventricle: The cavity size was normal. Wall thickness was increased in a pattern of moderate LVH. There was focal basal hypertrophy. Systolic function was moderately reduced. The estimated ejection fraction was in the range of 35% to 40%. Akinesis of the mid-apicalanteroseptal myocardium. Doppler parameters are consistent with abnormal left ventricular relaxation (grade 1 diastolic dysfunction).  Assessment/Plan:   Active Problems:   Abnormal EKG   NSTEMI (non-ST elevated myocardial infarction) ([place])  1. NSTEMI due LAD occlusion with collaterals; s/p Synergy stent insertion to LAD;  Evolving ECG changes. Troponin 20.4.  2. Ischemic cardiomyopathy; EF 35 - 40% on echo.  3. H/o HTN 4. Hyperlipidemia: LDL 146, now on atorvastatin 80 mg daily.  Feeling well without recurrent chest pain post PCI to LAD.  Resting pulse in the 80's. Will change metoprolol to carvedilol with reduced EF today and add low dose nitrates with concomitant CAD. Continue ACE-I.F/U  LFT's and BMET in am.  Ambulate. Plan to transfer to telemetry; probable DC tomorrow. Pt has requested that I follow him as outpatient, and I will be happy to assume his care.   Troy Sine, MD, [facility] 10/31/2014, 8:47 AM

## 2014-11-01 ENCOUNTER — Encounter (HOSPITAL_COMMUNITY): Payer: Self-pay | Admitting: Interventional Cardiology

## 2014-11-01 DIAGNOSIS — I10 Essential (primary) hypertension: Secondary | ICD-10-CM

## 2014-11-01 DIAGNOSIS — E785 Hyperlipidemia, unspecified: Secondary | ICD-10-CM

## 2014-11-01 LAB — BASIC METABOLIC PANEL
Anion gap: 8 (ref 5–15)
BUN: 16 mg/dL (ref 6–20)
CO2: 27 mmol/L (ref 22–32)
Calcium: 9.5 mg/dL (ref 8.9–10.3)
Chloride: 106 mmol/L (ref 101–111)
Creatinine, Ser: 1.18 mg/dL (ref 0.61–1.24)
GFR calc Af Amer: >60 mL/min (ref >60)
GFR calc non Af Amer: >60 mL/min (ref >60)
Glucose, Bld: 111 mg/dL — ABNORMAL HIGH (ref 65–99)
Potassium: 4.6 mmol/L (ref 3.5–5.1)
Sodium: 141 mmol/L (ref 135–145)

## 2014-11-01 LAB — HEPATIC FUNCTION PANEL
ALT: 30 U/L (ref 17–63)
AST: 32 U/L (ref 15–41)
Albumin: 3.5 g/dL (ref 3.5–5.0)
Alkaline Phosphatase: 67 U/L (ref 38–126)
Bilirubin, Direct: 0.2 mg/dL (ref 0.1–0.5)
Indirect Bilirubin: 1.1 mg/dL — ABNORMAL HIGH (ref 0.3–0.9)
Total Bilirubin: 1.3 mg/dL — ABNORMAL HIGH (ref 0.3–1.2)
Total Protein: 6.4 g/dL — ABNORMAL LOW (ref 6.5–8.1)

## 2014-11-01 MED ORDER — TICAGRELOR 90 MG PO TABS: 90.0000 mg | ORAL_TABLET | Freq: Two times a day (BID) | ORAL | Status: DC

## 2014-11-01 MED ORDER — ATORVASTATIN CALCIUM 80 MG PO TABS: 80.0000 mg | ORAL_TABLET | Freq: Every day | ORAL | Status: DC

## 2014-11-01 MED ORDER — NITROGLYCERIN 0.4 MG SL SUBL: 0.4000 mg | SUBLINGUAL_TABLET | SUBLINGUAL | Status: DC | PRN

## 2014-11-01 MED ORDER — LISINOPRIL 10 MG PO TABS: 10.0000 mg | ORAL_TABLET | Freq: Every day | ORAL | Status: DC

## 2014-11-01 MED ORDER — PANTOPRAZOLE SODIUM 40 MG PO TBEC: 40.0000 mg | DELAYED_RELEASE_TABLET | Freq: Every day | ORAL | Status: DC

## 2014-11-01 MED ORDER — ISOSORBIDE MONONITRATE ER 30 MG PO TB24: 30.0000 mg | ORAL_TABLET | Freq: Every day | ORAL | Status: DC

## 2014-11-01 MED ORDER — CARVEDILOL 6.25 MG PO TABS: 6.2500 mg | ORAL_TABLET | Freq: Two times a day (BID) | ORAL | Status: DC

## 2014-11-01 NOTE — Progress Notes (Signed)
0950-1020 Pt walking independently so did not walk with him. Since EF 35-40%, gave CHF booklet and reviewed importance of daily weights, low sodium of 2000 mg and fluid restriction. Discussed when to call MD with signs/symptoms. Reviewed also heart healthy choices. Pt motivated to make needed changes. Referring to [place] Phase 2. Graylon Good RN BSN 11/01/2014 10:19 AM  r

## 2014-11-01 NOTE — Discharge Summary (Signed)
Discharge Summary   Patient ID: Vincent Flores,  MRN: [id number], DOB/AGE: 10-01-1950 64 y.o.  Admit date: 10/29/2014 Discharge date: 11/01/2014  Primary Care Provider: No PCP Per Patient Primary Cardiologist: Dr. Claiborne Billings  Discharge Diagnoses Principal Problem:   NSTEMI (non-ST elevated myocardial infarction) [facility]) Active Problems:   Abnormal EKG   Essential hypertension   Hyperlipidemia   Allergies No Known Allergies  Procedures  Cardiac catheterization 10/29/2014 Conclusion    1. Mid RCA lesion, 45% stenosed. 2. Dist RCA lesion, 60% stenosed. 3. 1st Mrg lesion, 60% stenosed. 4. Ost 1st Mrg lesion, 50% stenosed. 5. Prox Cx to Dist Cx lesion, 30% stenosed. 6. Mid LAD lesion, 100% stenosed.   Non-ST elevation myocardial infarction with ongoing chest discomfort on presentation and elevated markers.  Total occlusion of the proximal to mid LAD with both right to left and left to left collaterals to the LAD, producing the non-STEMI presentation rather than ST elevation infarction.  Successful PTCA and stenting of the LAD from 100% to 0% with TIMI grade 3 flow. Final post PCI balloon diameter 3.25 mm.   Moderate mid and distal right coronary, and first obtuse marginal.  Left ventricular systolic dysfunction with apical moderate hypokinesis. EF 50%.   RECOMMENDATIONS:   Dual antiplatelet therapy  IV nitroglycerin for least 24 hours  ACE inhibitor therapy and initiation of beta blocker therapy  Opiates is for pain control  Potential 48 hour discharge assuming no significant arrhythmias or other acute complications.     Echocardiogram 10/30/2014  LV EF: 35% -  40%  ------------------------------------------------------------------- Indications:   Chest pain 786.51.  ------------------------------------------------------------------- History:  PMH: Patient is post cath and stent placement on previous day. PMH:  Myocardial  infarction.  ------------------------------------------------------------------- Study Conclusions  - Left ventricle: The cavity size was normal. Wall thickness was increased in a pattern of moderate LVH. There was focal basal hypertrophy. Systolic function was moderately reduced. The estimated ejection fraction was in the range of 35% to 40%. Akinesis of the mid-apicalanteroseptal myocardium. Doppler parameters are consistent with abnormal left ventricular relaxation (grade 1 diastolic dysfunction).    Hospital Course  The patient is a 64 year old male with past medical history of hypertension and hyperlipidemia, however no past cardiac history who presented to [facility] on 10/29/2014 for CP. She does have 40+ year history of tobacco abuse but quit 4 years ago. First troponin came back positive at 2.17. Serial troponin trended down from 20.94. She was taken to cath lab on 10/29/2014 which showed 100% prox to mid LAD with both R to L and L to L collaterals producing NSTEMI instead of STEMI, treated with PTCA and stenting, 45% mid RCA, 60% dist RCA, 60% OM1, EF 50%. Echocardiogram was obtained on the following day 10/30/2014 EF 35-40%, akinesis of mid apical anteroseptal, grade 1 diastolic dysfunction. Lipid panel came back showing elevated cholesterol 211, LDL 146, HDL 38. Post cath, he was started on aspirin, brilinta, BB, ACEI and lipitor. He was initially started on metoprolol, this has been changed to carvedilol 6.25 mg twice a day due to LV dysfunction. Imdur 30 mg daily was added as well.  Patient was seen in the morning of 11/01/2014, right radial cath site appears to be stable without significant bleeding or hematoma. Patient denies any chest discomfort or shortness breath. Patient is deemed stable for discharge from cardiology perspective. I have arranged 7 day transition of care follow-up after which he will follow-up with Dr. Claiborne Billings. Of note, during this admission,  his hemoglobin  A1c was 5.7, I have encouraged diet and exercise given borderline normal level. I have emphasized on compliance with DAPT. He has been referred to cardiac rehab and has been ambulating without difficulty.    Discharge Vitals Blood pressure 145/79, pulse 84, temperature 98.4 F (36.9 C), temperature source Oral, resp. rate 16, height 5\' 6"  (1.676 m), weight 198 lb (89.812 kg), SpO2 100 %.  Filed Weights   10/29/14 0317 10/29/14 1530 11/01/14 0500  Weight: 200 lb (90.719 kg) 203 lb 14.8 oz (92.5 kg) 198 lb (89.812 kg)    Labs  CBC  Recent Labs  10/29/14 1558 10/30/14 0445  WBC 9.5 11.7*  HGB 14.1 13.7  HCT 40.8 39.7  MCV 99.3 99.0  PLT 147* 329   Basic Metabolic Panel  Recent Labs  10/30/14 0445 11/01/14 0342  NA 132* 141  K 5.0 4.6  CL 102 106  CO2 23 27  GLUCOSE 114* 111*  BUN 9 16  CREATININE 0.92 1.18  CALCIUM 8.1* 9.5   Liver Function Tests  Recent Labs  10/29/14 1558 11/01/14 0342  AST 110* 32  ALT 37 30  ALKPHOS 67 67  BILITOT 0.7 1.3*  PROT 6.0* 6.4*  ALBUMIN 3.4* 3.5   Cardiac Enzymes  Recent Labs  10/29/14 1558 10/29/14 2238 10/30/14 0445  TROPONINI 20.94* 13.19* 7.89*   Hemoglobin A1C  Recent Labs  10/29/14 1558  HGBA1C 5.7*   Fasting Lipid Panel  Recent Labs  10/30/14 0445  CHOL 211*  HDL 38*  LDLCALC 146*  TRIG 134  CHOLHDL 5.6    Disposition  Pt is being discharged home today in good condition.  Follow-up Plans & Appointments      Follow-up Information    Follow up with Richardson Dopp, PA-C On 11/09/2014.   Specialties:  Physician Assistant, Radiology, Interventional Cardiology   Why:  9:00am   Contact information:   [address] [place] [address] [phone]       Discharge Medications    Medication List    STOP taking these medications        amLODipine 10 MG tablet  Commonly known as:  NORVASC     lansoprazole 30 MG capsule  Commonly known as:  PREVACID   Replaced by:  pantoprazole 40 MG tablet     rosuvastatin 10 MG tablet  Commonly known as:  CRESTOR      TAKE these medications        aspirin EC 81 MG tablet  Take 81 mg by mouth daily.     atorvastatin 80 MG tablet  Commonly known as:  LIPITOR  Take 1 tablet (80 mg total) by mouth daily at 6 PM.     carvedilol 6.25 MG tablet  Commonly known as:  COREG  Take 1 tablet (6.25 mg total) by mouth 2 (two) times daily with a meal.     isosorbide mononitrate 30 MG 24 hr tablet  Commonly known as:  IMDUR  Take 1 tablet (30 mg total) by mouth daily.     lisinopril 10 MG tablet  Commonly known as:  PRINIVIL,ZESTRIL  Take 1 tablet (10 mg total) by mouth daily.     nitroGLYCERIN 0.4 MG SL tablet  Commonly known as:  NITROSTAT  Place 1 tablet (0.4 mg total) under the tongue every 5 (five) minutes x 3 doses as needed for chest pain.     pantoprazole 40 MG tablet  Commonly known as:  PROTONIX  Take 1 tablet (40  mg total) by mouth daily.     ticagrelor 90 MG Tabs tablet  Commonly known as:  BRILINTA  Take 1 tablet (90 mg total) by mouth 2 (two) times daily.        Duration of Discharge Encounter   Greater than 30 minutes including physician time.  Hilbert Corrigan PA-C Pager: [phone] 11/01/2014, 11:25 AM

## 2014-11-01 NOTE — Discharge Instructions (Signed)
Acute Coronary Syndrome °Acute coronary syndrome (ACS) is a serious problem in which there is suddenly not enough blood and oxygen supplied to the heart. ACS may mean that one or more of the blood vessels in your heart (coronary arteries) may be blocked. ACS can result in chest pain or a heart attack (myocardial infarction or MI). °CAUSES °This condition is caused by atherosclerosis, which is the buildup of fat and cholesterol (plaque) on the inside of the arteries. Over time, the plaque may narrow or block the artery, and this will lessen blood flow to the heart. Plaque can also become weak and break off within a coronary artery to form a clot and cause a sudden blockage. °RISK FACTORS °The risks factors of this condition include: °· High cholesterol levels. °· High blood pressure (hypertension). °· Smoking. °· Diabetes. °· Age. °· Family history of chest pain, heart disease, or stroke. °· Lack of exercise. °SYMPTOMS °The most common signs of this condition include: °· Chest pain, which can be: °¨ A crushing or squeezing in the chest. °¨ A tightness, pressure, fullness, or heaviness in the chest. °¨ Present for more than a few minutes, or it can stop and recur. °· Pain in the arms, neck, jaw, or back. °· Unexplained heartburn or indigestion. °· Shortness of breath. °· Nausea. °· Sudden cold sweats. °· Feeling light-headed or dizzy. °Sometimes, this condition has no symptoms. °DIAGNOSIS °ACS may be diagnosed through the following tests: °· Electrocardiogram (ECG). °· Blood tests. °· Coronary angiogram. This is a procedure to look at the coronary arteries to see if there is any blockage. °TREATMENT °Treatment for ACS may include: °· Healthy behavioral changes to reduce or control risk factors. °· Medicine. °· Coronary stenting. A stent helps to keep an artery open. °· Coronary angioplasty. This procedure widens a narrowed or blocked artery. °· Coronary artery bypass surgery. This will allow your blood to pass the  blockage (bypass) to reach your heart. °HOME CARE INSTRUCTIONS °Eating and Drinking °· Follow a heart-healthy diet. A dietitian can you help to educate you about healthy food options and changes. °· Use healthy cooking methods such as roasting, grilling, broiling, baking, poaching, steaming, or stir-frying. Talk to a dietitian to learn more about healthy cooking methods. °Medicines °· Take medicines only as directed by your health care provider. °· Do not take the following medicines unless your health care provider approves: °¨ Nonsteroidal anti-inflammatory drugs (NSAIDs), such as ibuprofen, naproxen, or celecoxib. °¨ Vitamin supplements that contain vitamin A, vitamin E, or both. °¨ Hormone replacement therapy that contains estrogen with or without progestin. °· Stop illegal drug use. °Activities °· Follow an exercise program that is approved by your health care provider. °· Plan rest periods when you are fatigued. °Lifestyle °· Do not use any tobacco products, including cigarettes, chewing tobacco, or electronic cigarettes. If you need help quitting, ask your health care provider. °· If you drink alcohol, and your health care provider approves, limit your alcohol intake to no more than 1 drink per day. One drink equals 12 ounces of beer, 5 ounces of wine, or 1½ ounces of hard liquor. °· Learn to manage stress. °· Maintain a healthy weight. Lose weight as approved by your health care provider. °General Instructions °· Manage other health conditions, such as hypertension and diabetes, as directed by your health care provider. °· Keep all follow-up visits as directed by your health care provider. This is important. °· Your health care provider may ask you to monitor your blood   pressure. A blood pressure reading consists of a higher number over a lower number, such as 110 over 72, written as 110/72. Ideally, your blood pressure should be: °¨ Below 140/90 if you have no other medical conditions. °¨ Below 130/80 if  you have diabetes or kidney disease. °SEEK IMMEDIATE MEDICAL CARE IF: °· You have pain in your chest, neck, arm, jaw, stomach, or back that lasts more than a few minutes, is recurring, or is not relieved by taking medicine under your tongue (sublingual nitroglycerin). °· You have profuse sweating without cause. °· You have unexplained: °¨ Heartburn or indigestion. °¨ Shortness of breath or difficulty breathing. °¨ Nausea or vomiting. °¨ Fatigue. °¨ Feelings of nervousness or anxiety. °¨ Weakness. °¨ Diarrhea. °· You have sudden light-headedness or dizziness. °· You faint. °These symptoms may represent a serious problem that is an emergency. Do not wait to see if the symptoms will go away. Get medical help right away. Call your local emergency services (911 in the [place] Do not drive yourself to the clinic or hospital. °  °This information is not intended to replace advice given to you by your health care provider. Make sure you discuss any questions you have with your health care provider. °  °Document Released: 12/25/2004 Document Revised: 01/15/2014 Document Reviewed: 04/28/2013 °Elsevier Interactive Patient Education ©2016 [employer] ° ° °No driving for 24 hours. No lifting over 5 lbs for 1 week. No sexual activity for 1 week. Keep procedure site clean & dry. If you notice increased pain, swelling, bleeding or pus, call/return!  You may shower, but no soaking baths/hot tubs/pools for 1 week.  ° ° °

## 2014-11-01 NOTE — Care Management Note (Signed)
Case Management Note  Patient Details  Name: Kelly Eisler MRN: [id number] Date of Birth: Jan 20, 1950  Subjective/Objective:  Pt admitted for NStemi. Plan for d/c home on Brilinta. Per pt he was given card via [employer].                  Action/Plan: CM did call [facility] in [place] and the medication is available. No further needs from CM at this time.   Expected Discharge Date:  10/31/14               Expected Discharge Plan:  Home/Self Care  In-House Referral:     Discharge planning Services  CM Consult, Medication Assistance  Post Acute Care Choice:  NA Choice offered to:  NA  DME Arranged:  N/A DME Agency:  NA  HH Arranged:  NA HH Agency:  NA  Status of Service:  Completed, signed off  Medicare Important Message Given:    Date Medicare IM Given:    Medicare IM give by:    Date Additional Medicare IM Given:    Additional Medicare Important Message give by:     If discussed at [place] of Stay Meetings, dates discussed:    Additional Comments:  Bethena Roys, RN 11/01/2014, 11:41 AM

## 2014-11-01 NOTE — Progress Notes (Signed)
Patient Name: Vincent Flores Date of Encounter: 11/01/2014  Primary Cardiologist: Dr. Claiborne Billings   Principal Problem:   NSTEMI (non-ST elevated myocardial infarction) [facility]) Active Problems:   Abnormal EKG   Essential hypertension   Hyperlipidemia    SUBJECTIVE  Denies any CP or SOB overnight. States he has been having exertional CP for months relieved with rest, this time, he came in because CP occurred at rest and did not go away.  CURRENT MEDS . aspirin EC  81 mg Oral Daily  . atorvastatin  80 mg Oral q1800  . carvedilol  6.25 mg Oral BID WC  . enoxaparin (LOVENOX) injection  40 mg Subcutaneous Q24H  . isosorbide mononitrate  30 mg Oral Daily  . lisinopril  10 mg Oral Daily  . pantoprazole  40 mg Oral Daily  . sodium chloride  3 mL Intravenous Q12H  . ticagrelor  90 mg Oral BID    OBJECTIVE  Filed Vitals:   10/31/14 1749 10/31/14 2100 11/01/14 0500 11/01/14 0801  BP: 124/79 119/75 105/58 145/79  Pulse: 92 79 67 84  Temp:  98.3 F (36.8 C) 98.4 F (36.9 C)   TempSrc:      Resp:  18 16   Height:      Weight:   198 lb (89.812 kg)   SpO2:  99% 100%     Intake/Output Summary (Last 24 hours) at 11/01/14 0849 Last data filed at 11/01/14 0500  Gross per 24 hour  Intake    720 ml  Output    250 ml  Net    470 ml   Filed Weights   10/29/14 0317 10/29/14 1530 11/01/14 0500  Weight: 200 lb (90.719 kg) 203 lb 14.8 oz (92.5 kg) 198 lb (89.812 kg)    PHYSICAL EXAM  General: Pleasant, NAD. Neuro: Alert and oriented X 3. Moves all extremities spontaneously. Psych: Normal affect. HEENT:  Normal  Neck: Supple without bruits or JVD. Lungs:  Resp regular and unlabored, CTA. Heart: RRR no s3, s4, or murmurs. R radial cath site stable with 2+ distal pulse.  Abdomen: Soft, non-tender, non-distended, BS + x 4.  Extremities: No clubbing, cyanosis or edema. DP/PT/Radials 2+ and equal bilaterally.  Accessory Clinical Findings  CBC  Recent Labs  10/29/14 1558  10/30/14 0445  WBC 9.5 11.7*  HGB 14.1 13.7  HCT 40.8 39.7  MCV 99.3 99.0  PLT 147* 923   Basic Metabolic Panel  Recent Labs  10/29/14 1558 10/30/14 0445  NA  --  132*  K  --  5.0  CL  --  102  CO2  --  23  GLUCOSE  --  114*  BUN  --  9  CREATININE 1.10 0.92  CALCIUM  --  8.1*   Liver Function Tests  Recent Labs  10/29/14 1558  AST 110*  ALT 37  ALKPHOS 67  BILITOT 0.7  PROT 6.0*  ALBUMIN 3.4*   Cardiac Enzymes  Recent Labs  10/29/14 1558 10/29/14 2238 10/30/14 0445  TROPONINI 20.94* 13.19* 7.89*   Hemoglobin A1C  Recent Labs  10/29/14 1558  HGBA1C 5.7*   Fasting Lipid Panel  Recent Labs  10/30/14 0445  CHOL 211*  HDL 38*  LDLCALC 146*  TRIG 134  CHOLHDL 5.6   TELE NSR without significant ventricular ectopy    ECG  No new EKG  Echocardiogram 10/30/2014  LV EF: 35% -  40%  ------------------------------------------------------------------- Indications:   Chest pain 786.51.  ------------------------------------------------------------------- History:  PMH: Patient is  post cath and stent placement on previous day. PMH:  Myocardial infarction.  ------------------------------------------------------------------- Study Conclusions  - Left ventricle: The cavity size was normal. Wall thickness was increased in a pattern of moderate LVH. There was focal basal hypertrophy. Systolic function was moderately reduced. The estimated ejection fraction was in the range of 35% to 40%. Akinesis of the mid-apicalanteroseptal myocardium. Doppler parameters are consistent with abnormal left ventricular relaxation (grade 1 diastolic dysfunction).     Radiology/Studies  Dg Chest 2 View  10/29/2014  CLINICAL DATA:  Initial evaluation for acute chest pain. History of hypertension. Prior smoker. EXAM: CHEST  2 VIEW COMPARISON:  Prior radiograph from 10/18/2011. FINDINGS: Cardiac and mediastinal silhouettes are stable in size  and contour, and remain within normal limits. Lungs are normally inflated. No focal infiltrate to suggest pneumonia. There is mild diffuse peribronchial thickening and prominence of the interstitial markings, which may reflect atypical lung infection or possibly bronchiolitis. No overt pulmonary edema. No pleural effusion. No acute osseus abnormality. Multilevel degenerative changes present within the visualized spine. IMPRESSION: 1. Mild diffuse peribronchial thickening with prominence of the interstitial markings. Findings are nonspecific, and may reflect sequela of atypical infection and/or bronchiolitis. Possible mild and/or developing pulmonary interstitial edema could also have this appearance. No focal infiltrates to suggest pneumonia. 2. No other active cardiopulmonary disease Electronically Signed   By: Jeannine Boga M.D.   On: 10/29/2014 04:48   Ct Angio Chest Aorta W/cm &/or Wo/cm  10/29/2014  CLINICAL DATA:  Acute onset of right central chest pain, radiating to the back. Shortness of breath. Initial encounter. EXAM: CT ANGIOGRAPHY CHEST WITH CONTRAST TECHNIQUE: Multidetector CT imaging of the chest was performed using the standard protocol during bolus administration of intravenous contrast. Multiplanar CT image reconstructions and MIPs were obtained to evaluate the vascular anatomy. CONTRAST:  115mL OMNIPAQUE IOHEXOL 350 MG/ML SOLN COMPARISON:  Chest radiograph performed earlier today at 4:35 a.m. FINDINGS: There is no evidence of aortic dissection. There is no evidence of aneurysmal dilatation. The great vessels are grossly unremarkable in appearance. No significant calcific atherosclerotic disease is seen. There is no evidence of pulmonary embolus. Scattered blebs are noted at the lung apices. The lungs are otherwise clear. There is no evidence of significant focal consolidation, pleural effusion or pneumothorax. No masses are identified; no abnormal focal contrast enhancement is seen.  Scattered coronary artery calcifications are seen. The mediastinum is otherwise unremarkable. No mediastinal lymphadenopathy is seen. No pericardial effusion is identified. No axillary lymphadenopathy is seen. The visualized portions of the thyroid gland are unremarkable in appearance. A 5.1 cm cyst is noted within the right hepatic lobe. The spleen, gallbladder, adrenal glands, and visualized portions of the pancreas are grossly unremarkable. Nonspecific perinephric stranding is noted bilaterally. No acute osseous abnormalities are seen. Anterior bridging osteophytes are seen along the lower thoracic spine. Review of the MIP images confirms the above findings. IMPRESSION: 1. No evidence of aortic dissection. No evidence of aneurysmal dilatation. 2. No evidence of pulmonary embolus. 3. Scattered blebs at the lung apices.  Lungs otherwise clear. 4. Scattered coronary artery calcifications seen. 5. 5.1 cm right hepatic lobe cyst noted. Electronically Signed   By: Garald Balding M.D.   On: 10/29/2014 05:48    ASSESSMENT AND PLAN  1. NSTEMI  - cath 10/29/2014 100% prox to mid LAD with both R to L and L to L collaterals producing NSTEMI instead of STEMI, treated with PTCA and stenting, 45% mid RCA, 60% dist RCA, 60% OM1, EF  50%  - Echo 10/30/2014 EF 35-40%, akinesis of mid apical anteroseptal, grade 1 diastolic dysfunction.   - continue ASA, lipitor, coreg, imdur, lisinopril and brilinta. Pending LFT and BMET today. Stable for discharge today if lab ok.  2. ICM on EF 35-40%   3. HTN 4. HLD: Lipid panel 10/22 Chol 211, LDL 146, HDL 38 5. Prediabetes: hgb 5.7, borderline, encourage diet and exercise  Signed, Woodward Ku Pager: [phone]   The patient was seen, examined and discussed with Almyra Deforest, PA-C and I agree with the above.   64 year old male with NSTEMI, s/p PCI to LAD on Friday 01/28/2014, now asymptomatic, we will discharge home today, arrange a follow up with Dr Claiborne Billings. Continue DAPT with  ASA/Brilinta, continue coreg, lisinopril, lipitor. BP is controlled. New low LVEF 35-40%, normal in 2013, repeat echocardiogram in 6 weeks to reevaluate. Repeat lipids and CMP in 4-6 weeks.   Dorothy Spark 11/01/2014

## 2014-11-02 ENCOUNTER — Telehealth: Payer: Self-pay | Admitting: Cardiovascular Disease

## 2014-11-02 NOTE — Telephone Encounter (Signed)
Ok to take tylenol pm.

## 2014-11-02 NOTE — Telephone Encounter (Signed)
Pt informed OK per pharmacy review to take tylenol PM. He voiced understanding and thanks for call. Advised to call in future for any concerns.

## 2014-11-02 NOTE — Telephone Encounter (Signed)
Vincent Flores is wanting to know if he can take Tylenol PM .Please call  Thanks

## 2014-11-02 NOTE — Telephone Encounter (Signed)
New patient of Dr. Evette Georges w/ recent hospitalization for NSTEMI wanted to make sure we were aware that he has been taking Tylenol PM PRN as a sleep aide in the past. Wanted to know if this is safe to continue, or if other recommendation for sleep aide.  Informed him I would defer to pharmacy to review w/ current meds for interactions and advise on best use.

## 2014-11-02 NOTE — Telephone Encounter (Signed)
D/C phone call .Marland Kitchen Appt is on 11/09/14 at 9am w/ Richardson Dopp at the [employer]

## 2014-11-02 NOTE — Telephone Encounter (Signed)
TOC call to patient.He stated he is feeling well.He understands discharge instructions and taking medications as prescribed.Advised to keep post hospital appointment with Richardson Dopp PA 11/1/6 at 9:00 am at [facility] office.

## 2014-11-04 ENCOUNTER — Emergency Department (HOSPITAL_COMMUNITY): Payer: BLUE CROSS/BLUE SHIELD

## 2014-11-04 ENCOUNTER — Encounter (HOSPITAL_COMMUNITY): Payer: Self-pay | Admitting: *Deleted

## 2014-11-04 ENCOUNTER — Emergency Department (HOSPITAL_COMMUNITY)
Admission: EM | Admit: 2014-11-04 | Discharge: 2014-11-04 | Disposition: A | Discharge status: Home / Self Care | Payer: BLUE CROSS/BLUE SHIELD | Attending: Emergency Medicine | Admitting: Emergency Medicine

## 2014-11-04 DIAGNOSIS — Z7982 Long term (current) use of aspirin: Secondary | ICD-10-CM | POA: Insufficient documentation

## 2014-11-04 DIAGNOSIS — I214 Non-ST elevation (NSTEMI) myocardial infarction: Secondary | ICD-10-CM | POA: Diagnosis not present

## 2014-11-04 DIAGNOSIS — E785 Hyperlipidemia, unspecified: Secondary | ICD-10-CM | POA: Diagnosis not present

## 2014-11-04 DIAGNOSIS — R079 Chest pain, unspecified: Secondary | ICD-10-CM | POA: Diagnosis not present

## 2014-11-04 DIAGNOSIS — Z87891 Personal history of nicotine dependence: Secondary | ICD-10-CM | POA: Insufficient documentation

## 2014-11-04 DIAGNOSIS — R072 Precordial pain: Secondary | ICD-10-CM

## 2014-11-04 DIAGNOSIS — Z79899 Other long term (current) drug therapy: Secondary | ICD-10-CM | POA: Insufficient documentation

## 2014-11-04 DIAGNOSIS — I10 Essential (primary) hypertension: Secondary | ICD-10-CM | POA: Diagnosis not present

## 2014-11-04 LAB — TROPONIN I
Troponin I: 0.51 ng/mL (ref <0.031)
Troponin I: 0.45 ng/mL — ABNORMAL HIGH (ref <0.031)

## 2014-11-04 LAB — BASIC METABOLIC PANEL
Anion gap: 9 (ref 5–15)
BUN: 24 mg/dL — ABNORMAL HIGH (ref 6–20)
CO2: 23 mmol/L (ref 22–32)
Calcium: 9.7 mg/dL (ref 8.9–10.3)
Chloride: 102 mmol/L (ref 101–111)
Creatinine, Ser: 1.15 mg/dL (ref 0.61–1.24)
GFR calc Af Amer: >60 mL/min (ref >60)
GFR calc non Af Amer: >60 mL/min (ref >60)
Glucose, Bld: 114 mg/dL — ABNORMAL HIGH (ref 65–99)
Potassium: 4.3 mmol/L (ref 3.5–5.1)
Sodium: 134 mmol/L — ABNORMAL LOW (ref 135–145)

## 2014-11-04 LAB — CBC WITH DIFFERENTIAL/PLATELET
Basophils Absolute: 0.1 10*3/uL (ref 0.0–0.1)
Basophils Relative: 1 %
Eosinophils Absolute: 0.3 10*3/uL (ref 0.0–0.7)
Eosinophils Relative: 3 %
HCT: 44.7 % (ref 39.0–52.0)
Hemoglobin: 15.8 g/dL (ref 13.0–17.0)
Lymphocytes Relative: 30 %
Lymphs Abs: 2.7 10*3/uL (ref 0.7–4.0)
MCH: 35 pg — ABNORMAL HIGH (ref 26.0–34.0)
MCHC: 35.3 g/dL (ref 30.0–36.0)
MCV: 98.9 fL (ref 78.0–100.0)
Monocytes Absolute: 0.8 10*3/uL (ref 0.1–1.0)
Monocytes Relative: 9 %
Neutro Abs: 5.1 10*3/uL (ref 1.7–7.7)
Neutrophils Relative %: 57 %
Platelets: 215 10*3/uL (ref 150–400)
RBC: 4.52 MIL/uL (ref 4.22–5.81)
RDW: 12.2 % (ref 11.5–15.5)
WBC: 8.9 10*3/uL (ref 4.0–10.5)

## 2014-11-04 LAB — BRAIN NATRIURETIC PEPTIDE: B Natriuretic Peptide: 68.7 pg/mL (ref 0.0–100.0)

## 2014-11-04 MED ORDER — ASPIRIN 81 MG PO CHEW
324.0000 mg | CHEWABLE_TABLET | Freq: Once | ORAL | Status: AC
  Administered 2014-11-04: 324 mg via ORAL
  Filled 2014-11-04: qty 4

## 2014-11-04 NOTE — Consult Note (Signed)
Patient ID: Vincent Flores MRN: [id number], DOB/AGE: Aug 30, 1950   Admit date: 11/04/2014   Primary Physician: No PCP Per Patient Primary Cardiologist: Dr. Claiborne Billings  Pt. Profile:  The patient is a pleasant 64 year old Caucasian male with PMH of hypertension, hyperlipidemia, ICM with EF 35-40%, and CAD s/p recent DES to LAD discharged on 10/24, returned today with recurrent CP.   Problem List  Past Medical History  Diagnosis Date  . HTN (hypertension)   . Hyperlipidemia   . Shortness of breath   . Abnormal EKG     Past Surgical History  Procedure Laterality Date  . Appendectomy  1967  . Transthoracic echocardiogram  [id number]    normal global wall motion.  normal systolic global function.  calculated EF 61 %  . Cardiac catheterization N/A 10/29/2014    Procedure: Left Heart Cath and Coronary Angiography;  Surgeon: Belva Crome, MD;  Location: [place] [facility];  Service: Cardiovascular;  Laterality: N/A;  . Cardiac catheterization N/A 10/29/2014    Procedure: Coronary Stent Intervention;  Surgeon: Belva Crome, MD;  Location: [place] [facility];  Service: Cardiovascular;  Laterality: N/A;  . Coronary stent placement       Allergies  No Known Allergies  HPI  The patient is a pleasant 64 year old Caucasian male with PMH of hypertension, hyperlipidemia and CAD s/p recent DES to LAD.  Patient was recently admitted on 10/29/14 -10/24. He came in after several months of exertional symptom with unremitting chest pain on 10/21, he was ruled in for NSTEMI, cardiac catheterization done the same day showed 45% mid RCA, 60% distal RCA, 60% OM1, 100% mid LAD stenosis with both right to left and a left to left collateral to LAD, producing NSTEMI instead of true STEMI. He underwent successful PTCA and stenting of LAD postdilated to 3.25 mm. His EF was 50% during cath with apical moderate hypokinesis.  He was started on aspirin, Brilinta, and statin. Metoprolol was changed to Coreg. Imdur was  added given residual CAD. Lipid panel showed elevated cholesterol and LDL level. Echocardiogram obtained on 10/22 showed EF 35-40%, akinesis of mid apical anteroseptal, grade 1 diastolic dysfunction. Since discharge, patient has been taking his medication religiously. He has been walking every day without noticing significant exertional symptom.  He has been doing very well since discharge until yesterday on 11/03/2014 when he started noticing some mild chest pressure. He denies the same anginal symptom as last time which was burning sensation across the chest. He eventually went to bed and had good night sleep. He woke up around 5:30 AM this morning as usual. While sitting up read to make coffee, he started having recurrent mild chest discomfort, shortness breath, dizziness prompting him to seek medical attention at [facility] ED. On arrival, his creatinine is stable at 1.15. BNP was 68.7. Troponin was 0.51 which came down from 7.89 five days ago. Chest x-ray showed no acute process. EKG shows ST-T wave changes in anterior lead has been resolving, now he has some residual biphasic T-wave in anterior lead. According to the patient, his symptom has greatly improved since this morning.   Home Medications  Prior to Admission medications   Medication Sig Start Date End Date Taking? Authorizing Provider  aspirin EC 81 MG tablet Take 81 mg by mouth daily.   Yes Historical Provider, MD  atorvastatin (LIPITOR) 80 MG tablet Take 1 tablet (80 mg total) by mouth daily at 6 PM. 11/01/14  Yes Almyra Deforest, PA  carvedilol (COREG)  6.25 MG tablet Take 1 tablet (6.25 mg total) by mouth 2 (two) times daily with a meal. 11/01/14  Yes Almyra Deforest, PA  isosorbide mononitrate (IMDUR) 30 MG 24 hr tablet Take 1 tablet (30 mg total) by mouth daily. 11/01/14  Yes Almyra Deforest, PA  lisinopril (PRINIVIL,ZESTRIL) 10 MG tablet Take 1 tablet (10 mg total) by mouth daily. 11/01/14  Yes Almyra Deforest, PA  nitroGLYCERIN (NITROSTAT) 0.4 MG SL tablet  Place 1 tablet (0.4 mg total) under the tongue every 5 (five) minutes x 3 doses as needed for chest pain. 11/01/14  Yes Almyra Deforest, PA  pantoprazole (PROTONIX) 40 MG tablet Take 1 tablet (40 mg total) by mouth daily. 11/01/14  Yes Almyra Deforest, PA  ticagrelor (BRILINTA) 90 MG TABS tablet Take 1 tablet (90 mg total) by mouth 2 (two) times daily. 11/01/14  Yes Almyra Deforest, PA    Family History  Family History  Problem Relation Age of Onset  . Heart disease Mother   . Cancer Father     Lung, was a smoker    Social History  Social History   Social History  . Marital Status: Married    Spouse Name: N/A  . Number of Children: N/A  . Years of Education: N/A   Occupational History  . Not on file.   Social History Main Topics  . Smoking status: Former Smoker    Quit date: 01/08/2009  . Smokeless tobacco: Never Used  . Alcohol Use: 10.5 oz/week    21 Standard drinks or equivalent per week  . Drug Use: No  . Sexual Activity:    Partners: Female   Other Topics Concern  . Not on file   Social History Narrative     Review of Systems General:  No chills, fever, night sweats or weight changes.  Cardiovascular:  No dyspnea on exertion, edema, orthopnea, palpitations, paroxysmal nocturnal dyspnea. + chest pain, SOB Dermatological: No rash, lesions/masses Respiratory: No cough, dyspnea Urologic: No hematuria, dysuria Abdominal:   No nausea, vomiting, diarrhea, bright red blood per rectum, melena, or hematemesis Neurologic:  No visual changes, wkns, changes in mental status. +dizziness All other systems reviewed and are otherwise negative except as noted above.  Physical Exam  Blood pressure 120/78, pulse 58, temperature 97.8 F (36.6 C), temperature source Oral, resp. rate 12, height 5\' 6"  (1.676 m), weight 200 lb (90.719 kg), SpO2 99 %.  General: Pleasant, NAD Psych: Normal affect. Neuro: Alert and oriented X 3. Moves all extremities spontaneously. HEENT: Normal  Neck: Supple  without bruits or JVD. Lungs:  Resp regular and unlabored, CTA. Heart: RRR no s3, s4, or murmurs. Abdomen: Soft, non-tender, non-distended, BS + x 4.  Extremities: No clubbing, cyanosis or edema. DP/PT/Radials 2+ and equal bilaterally.  Labs  Troponin (Point of Care Test) No results for input(s): TROPIPOC in the last 72 hours.  Recent Labs  11/04/14 0744  TROPONINI 0.51*   Lab Results  Component Value Date   WBC 8.9 11/04/2014   HGB 15.8 11/04/2014   HCT 44.7 11/04/2014   MCV 98.9 11/04/2014   PLT 215 11/04/2014    Recent Labs Lab 11/01/14 0342 11/04/14 0744  NA 141 134*  K 4.6 4.3  CL 106 102  CO2 27 23  BUN 16 24*  CREATININE 1.18 1.15  CALCIUM 9.5 9.7  PROT 6.4*  --   BILITOT 1.3*  --   ALKPHOS 67  --   ALT 30  --   AST 32  --  GLUCOSE 111* 114*   Lab Results  Component Value Date   CHOL 211* 10/30/2014   HDL 38* 10/30/2014   LDLCALC 146* 10/30/2014   TRIG 134 10/30/2014   No results found for: DDIMER   Radiology/Studies  Dg Chest 2 View  10/29/2014  CLINICAL DATA:  Initial evaluation for acute chest pain. History of hypertension. Prior smoker. EXAM: CHEST  2 VIEW COMPARISON:  Prior radiograph from 10/18/2011. FINDINGS: Cardiac and mediastinal silhouettes are stable in size and contour, and remain within normal limits. Lungs are normally inflated. No focal infiltrate to suggest pneumonia. There is mild diffuse peribronchial thickening and prominence of the interstitial markings, which may reflect atypical lung infection or possibly bronchiolitis. No overt pulmonary edema. No pleural effusion. No acute osseus abnormality. Multilevel degenerative changes present within the visualized spine. IMPRESSION: 1. Mild diffuse peribronchial thickening with prominence of the interstitial markings. Findings are nonspecific, and may reflect sequela of atypical infection and/or bronchiolitis. Possible mild and/or developing pulmonary interstitial edema could also have this  appearance. No focal infiltrates to suggest pneumonia. 2. No other active cardiopulmonary disease Electronically Signed   By: Jeannine Boga M.D.   On: 10/29/2014 04:48   Dg Chest Port 1 View  11/04/2014  CLINICAL DATA:  Chest pain, shortness of Breath EXAM: PORTABLE CHEST 1 VIEW COMPARISON:  10/29/2014 FINDINGS: Cardiomediastinal silhouette is stable. No acute infiltrate or pleural effusion. No pulmonary edema. Bony thorax is unremarkable. IMPRESSION: No active disease. Electronically Signed   By: Lahoma Crocker M.D.   On: 11/04/2014 07:59   Ct Angio Chest Aorta W/cm &/or Wo/cm  10/29/2014  CLINICAL DATA:  Acute onset of right central chest pain, radiating to the back. Shortness of breath. Initial encounter. EXAM: CT ANGIOGRAPHY CHEST WITH CONTRAST TECHNIQUE: Multidetector CT imaging of the chest was performed using the standard protocol during bolus administration of intravenous contrast. Multiplanar CT image reconstructions and MIPs were obtained to evaluate the vascular anatomy. CONTRAST:  123mL OMNIPAQUE IOHEXOL 350 MG/ML SOLN COMPARISON:  Chest radiograph performed earlier today at 4:35 a.m. FINDINGS: There is no evidence of aortic dissection. There is no evidence of aneurysmal dilatation. The great vessels are grossly unremarkable in appearance. No significant calcific atherosclerotic disease is seen. There is no evidence of pulmonary embolus. Scattered blebs are noted at the lung apices. The lungs are otherwise clear. There is no evidence of significant focal consolidation, pleural effusion or pneumothorax. No masses are identified; no abnormal focal contrast enhancement is seen. Scattered coronary artery calcifications are seen. The mediastinum is otherwise unremarkable. No mediastinal lymphadenopathy is seen. No pericardial effusion is identified. No axillary lymphadenopathy is seen. The visualized portions of the thyroid gland are unremarkable in appearance. A 5.1 cm cyst is noted within the  right hepatic lobe. The spleen, gallbladder, adrenal glands, and visualized portions of the pancreas are grossly unremarkable. Nonspecific perinephric stranding is noted bilaterally. No acute osseous abnormalities are seen. Anterior bridging osteophytes are seen along the lower thoracic spine. Review of the MIP images confirms the above findings. IMPRESSION: 1. No evidence of aortic dissection. No evidence of aneurysmal dilatation. 2. No evidence of pulmonary embolus. 3. Scattered blebs at the lung apices.  Lungs otherwise clear. 4. Scattered coronary artery calcifications seen. 5. 5.1 cm right hepatic lobe cyst noted. Electronically Signed   By: Garald Balding M.D.   On: 10/29/2014 05:48    ECG  NSR with biphasic TWI in anterior leads  Echocardiogram 10/30/2014  LV EF: 35% -  40%  -------------------------------------------------------------------  Indications:   Chest pain 786.51.  ------------------------------------------------------------------- History:  PMH: Patient is post cath and stent placement on previous day. PMH:  Myocardial infarction.  ------------------------------------------------------------------- Study Conclusions  - Left ventricle: The cavity size was normal. Wall thickness was increased in a pattern of moderate LVH. There was focal basal hypertrophy. Systolic function was moderately reduced. The estimated ejection fraction was in the range of 35% to 40%. Akinesis of the mid-apicalanteroseptal myocardium. Doppler parameters are consistent with abnormal left ventricular relaxation (grade 1 diastolic dysfunction).    ASSESSMENT AND PLAN  1. Chest pain with SOB and dizziness  - although brilinta can cause SOB, however unlikely in this case as he did not have any SOB for 5 days after starting it.  - trop elevated at 0.51, but is trending down from last trop 5 days, will obtain another trop at 11:30am, expect it to continue trend down  - doubt  ACS, EKG improving from last admission. Does have residual dx in OM1 and distal RCA, only 60%. Continue Imdur  - will followup on 2nd trop at 11:30, then make decision regarding admit for Obs vs discharge from ED. Will discuss with MD.   2. CAD s/p DES to LAD on 10/29/2014  3. ICM with EF 35-40%: continue coreg  4. Hypertension  5. Hyperlipidemia   6. Prediabetes: hgb a1c 5.7 during last admission, given borderline normal level, advised diet and exercise  Signed, Almyra Deforest, PA-C 11/04/2014, 22:24 AM  64 year old male with h/o CAD, with NSTEMI on 10/29/2014 with occluded mid LAD with collateral and successful PCI/DES, residual moderate diffuse CAD in other vessels. Mx troponin on 10/21 was 20.9-->13--> 7.8 on 10/22. He returns with atypical CP and SOB, now resolved, troponi 0.51--> 0.45, this is the end of tail from recent NSTEMI. ECG unchanged from 11/01/14.  We will continue DAPT, high dose atorvastatin, carvedilol, lisinopril and imdur, sl NTG PRN. His BP and HR are WNL.  We will discharge and follow in our clinic.  Dorothy Spark 11/04/2014

## 2014-11-04 NOTE — ED Provider Notes (Signed)
CSN: [id number]     Arrival date & time 11/04/14  0732 History   First MD Initiated Contact with Patient 11/04/14 [phone]     Chief Complaint  Patient presents with  . Chest Pain     Patient is a 64 y.o. male presenting with chest pain. The history is provided by the patient. No language interpreter was used.  Chest Pain  Vincent Flores presents for evaluation of chest pain/SOB.  He had a cardiac stent placed six days ago.  He was doing well since the time of discharge until around 830 last night with he developed SOB and dizziness.  Sxs improved and he was able to sleep through the night.  This morning he developed increased SOB and dizziness, light cough.  He has mild associated chest discomfort, which he minimizes.  He denies any fevers, abdominal pain, vomiting, leg swelling.  His weight has stayed stable since the time of discharge.  He is taking his medications as directed but has not yet taken his morning medications.  He tried a sublingual NTG this morning with no significant change in his sxs.    Past Medical History  Diagnosis Date  . HTN (hypertension)   . Hyperlipidemia   . Shortness of breath   . Abnormal EKG    Past Surgical History  Procedure Laterality Date  . Appendectomy  1967  . Transthoracic echocardiogram  [id number]    normal global wall motion.  normal systolic global function.  calculated EF 61 %  . Cardiac catheterization N/A 10/29/2014    Procedure: Left Heart Cath and Coronary Angiography;  Surgeon: Belva Crome, MD;  Location: [facility];  Service: Cardiovascular;  Laterality: N/A;  . Cardiac catheterization N/A 10/29/2014    Procedure: Coronary Stent Intervention;  Surgeon: Belva Crome, MD;  Location: [facility];  Service: Cardiovascular;  Laterality: N/A;   Family History  Problem Relation Age of Onset  . Heart disease Mother   . Cancer Father     Lung, was a smoker   Social History  Substance Use Topics  . Smoking status: Former Research scientist (life sciences)  .  Smokeless tobacco: Never Used  . Alcohol Use: 10.5 oz/week    21 drink(s) per week    Review of Systems  Cardiovascular: Positive for chest pain.  All other systems reviewed and are negative.     Allergies  Review of patient's allergies indicates no known allergies.  Home Medications   Prior to Admission medications   Medication Sig Start Date End Date Taking? Authorizing Provider  aspirin EC 81 MG tablet Take 81 mg by mouth daily.    Historical Provider, MD  atorvastatin (LIPITOR) 80 MG tablet Take 1 tablet (80 mg total) by mouth daily at 6 PM. 11/01/14   Almyra Deforest, PA  carvedilol (COREG) 6.25 MG tablet Take 1 tablet (6.25 mg total) by mouth 2 (two) times daily with a meal. 11/01/14   Almyra Deforest, PA  isosorbide mononitrate (IMDUR) 30 MG 24 hr tablet Take 1 tablet (30 mg total) by mouth daily. 11/01/14   Almyra Deforest, PA  lisinopril (PRINIVIL,ZESTRIL) 10 MG tablet Take 1 tablet (10 mg total) by mouth daily. 11/01/14   Almyra Deforest, PA  nitroGLYCERIN (NITROSTAT) 0.4 MG SL tablet Place 1 tablet (0.4 mg total) under the tongue every 5 (five) minutes x 3 doses as needed for chest pain. 11/01/14   Almyra Deforest, PA  pantoprazole (PROTONIX) 40 MG tablet Take 1 tablet (40 mg total) by  mouth daily. 11/01/14   Almyra Deforest, PA  ticagrelor (BRILINTA) 90 MG TABS tablet Take 1 tablet (90 mg total) by mouth 2 (two) times daily. 11/01/14   Almyra Deforest, PA   BP 118/83 mmHg  Pulse 62  Temp(Src) 97.8 F (36.6 C) (Oral)  Ht 5\' 6"  (1.676 m)  Wt 200 lb (90.719 kg)  BMI 32.30 kg/m2  SpO2 100% Physical Exam  Constitutional: He is oriented to person, place, and time. He appears well-developed and well-nourished.  Uncomfortable appearing  HENT:  Head: Normocephalic and atraumatic.  Cardiovascular: Normal rate and regular rhythm.   No murmur heard. Pulmonary/Chest: Effort normal and breath sounds normal. No respiratory distress.  Abdominal: Soft. There is no tenderness. There is no rebound and no guarding.   Musculoskeletal: He exhibits no edema or tenderness.  Cath site in right wrist healing well.  2+ radial pulses.    Neurological: He is alert and oriented to person, place, and time.  Skin: Skin is warm and dry.  Psychiatric: He has a normal mood and affect. His behavior is normal.  Nursing note and vitals reviewed.   ED Course  Procedures (including critical care time) Labs Review Labs Reviewed  BASIC METABOLIC PANEL  CBC WITH DIFFERENTIAL/PLATELET  BRAIN NATRIURETIC PEPTIDE  TROPONIN I    Imaging Review No results found. I have personally reviewed and evaluated these images and lab results as part of my medical decision-making.   EKG Interpretation   Date/Time:  Thursday November 04 2014 07:38:19 EDT Ventricular Rate:  60 PR Interval:  127 QRS Duration: 97 QT Interval:  448 QTC Calculation: 448 R Axis:   86 Text Interpretation:  Sinus rhythm Borderline right axis deviation  Consider anterior infarct Abnormal T, consider ischemia, lateral leads  Baseline wander in lead(s) II III aVF Confirmed by Vincent Flores [phone]) on  11/04/2014 7:40:36 AM      MDM   Final diagnoses:  Chest pain, unspecified chest pain type   Pt with hx/o CAD s/p LAD stent 6 days ago here for SOB/chest discomfort.  EKG improved from most recent during hospitalization.  Pt well perfused, euvolemic on exam.  Cardiology consulted for evaluation.    Enzymes have been cycled and patient is asymptomatic in the ED.  Pt has been evaluated by Cardiology, plan to d/c home with outpatient follow up.  Return precautions were discussed.    Vincent Reichert, MD 11/05/14 1158

## 2014-11-04 NOTE — ED Notes (Signed)
Heart healthy lunch tray ordered 

## 2014-11-04 NOTE — Discharge Instructions (Signed)
Nonspecific Chest Pain  °Chest pain can be caused by many different conditions. There is always a chance that your pain could be related to something serious, such as a heart attack or a blood clot in your lungs. Chest pain can also be caused by conditions that are not life-threatening. If you have chest pain, it is very important to follow up with your health care provider. °CAUSES  °Chest pain can be caused by: °· Heartburn. °· Pneumonia or bronchitis. °· Anxiety or stress. °· Inflammation around your heart (pericarditis) or lung (pleuritis or pleurisy). °· A blood clot in your lung. °· A collapsed lung (pneumothorax). It can develop suddenly on its own (spontaneous pneumothorax) or from trauma to the chest. °· Shingles infection (varicella-zoster virus). °· Heart attack. °· Damage to the bones, muscles, and cartilage that make up your chest wall. This can include: °¨ Bruised bones due to injury. °¨ Strained muscles or cartilage due to frequent or repeated coughing or overwork. °¨ Fracture to one or more ribs. °¨ Sore cartilage due to inflammation (costochondritis). °RISK FACTORS  °Risk factors for chest pain may include: °· Activities that increase your risk for trauma or injury to your chest. °· Respiratory infections or conditions that cause frequent coughing. °· Medical conditions or overeating that can cause heartburn. °· Heart disease or family history of heart disease. °· Conditions or health behaviors that increase your risk of developing a blood clot. °· Having had chicken pox (varicella zoster). °SIGNS AND SYMPTOMS °Chest pain can feel like: °· Burning or tingling on the surface of your chest or deep in your chest. °· Crushing, pressure, aching, or squeezing pain. °· Dull or sharp pain that is worse when you move, cough, or take a deep breath. °· Pain that is also felt in your back, neck, shoulder, or arm, or pain that spreads to any of these areas. °Your chest pain may come and go, or it may stay  constant. °DIAGNOSIS °Lab tests or other studies may be needed to find the cause of your pain. Your health care provider may have you take a test called an ambulatory ECG (electrocardiogram). An ECG records your heartbeat patterns at the time the test is performed. You may also have other tests, such as: °· Transthoracic echocardiogram (TTE). During echocardiography, sound waves are used to create a picture of all of the heart structures and to look at how blood flows through your heart. °· Transesophageal echocardiogram (TEE). This is a more advanced imaging test that obtains images from inside your body. It allows your health care provider to see your heart in finer detail. °· Cardiac monitoring. This allows your health care provider to monitor your heart rate and rhythm in real time. °· Holter monitor. This is a portable device that records your heartbeat and can help to diagnose abnormal heartbeats. It allows your health care provider to track your heart activity for several days, if needed. °· Stress tests. These can be done through exercise or by taking medicine that makes your heart beat more quickly. °· Blood tests. °· Imaging tests. °TREATMENT  °Your treatment depends on what is causing your chest pain. Treatment may include: °· Medicines. These may include: °¨ Acid blockers for heartburn. °¨ Anti-inflammatory medicine. °¨ Pain medicine for inflammatory conditions. °¨ Antibiotic medicine, if an infection is present. °¨ Medicines to dissolve blood clots. °¨ Medicines to treat coronary artery disease. °· Supportive care for conditions that do not require medicines. This may include: °¨ Resting. °¨ Applying heat   or cold packs to injured areas. °¨ Limiting activities until pain decreases. °HOME CARE INSTRUCTIONS °· If you were prescribed an antibiotic medicine, finish it all even if you start to feel better. °· Avoid any activities that bring on chest pain. °· Do not use any tobacco products, including  cigarettes, chewing tobacco, or electronic cigarettes. If you need help quitting, ask your health care provider. °· Do not drink alcohol. °· Take medicines only as directed by your health care provider. °· Keep all follow-up visits as directed by your health care provider. This is important. This includes any further testing if your chest pain does not go away. °· If heartburn is the cause for your chest pain, you may be told to keep your head raised (elevated) while sleeping. This reduces the chance that acid will go from your stomach into your esophagus. °· Make lifestyle changes as directed by your health care provider. These may include: °¨ Getting regular exercise. Ask your health care provider to suggest some activities that are safe for you. °¨ Eating a heart-healthy diet. A registered dietitian can help you to learn healthy eating options. °¨ Maintaining a healthy weight. °¨ Managing diabetes, if necessary. °¨ Reducing stress. °SEEK MEDICAL CARE IF: °· Your chest pain does not go away after treatment. °· You have a rash with blisters on your chest. °· You have a fever. °SEEK IMMEDIATE MEDICAL CARE IF:  °· Your chest pain is worse. °· You have an increasing cough, or you cough up blood. °· You have severe abdominal pain. °· You have severe weakness. °· You faint. °· You have chills. °· You have sudden, unexplained chest discomfort. °· You have sudden, unexplained discomfort in your arms, back, neck, or jaw. °· You have shortness of breath at any time. °· You suddenly start to sweat, or your skin gets clammy. °· You feel nauseous or you vomit. °· You suddenly feel light-headed or dizzy. °· Your heart begins to beat quickly, or it feels like it is skipping beats. °These symptoms may represent a serious problem that is an emergency. Do not wait to see if the symptoms will go away. Get medical help right away. Call your local emergency services (911 in the [place] Do not drive yourself to the hospital. °  °This  information is not intended to replace advice given to you by your health care provider. Make sure you discuss any questions you have with your health care provider. °  °Document Released: 10/04/2004 Document Revised: 01/15/2014 Document Reviewed: 07/31/2013 °Elsevier Interactive Patient Education ©2016 [employer] Inc[employer] ° °

## 2014-11-05 ENCOUNTER — Telehealth: Payer: Self-pay | Admitting: *Deleted

## 2014-11-05 NOTE — Telephone Encounter (Signed)
Faxed cardiac rehab order to [facility]. 

## 2014-11-08 NOTE — Progress Notes (Signed)
Cardiology Office Note   Date:  11/09/2014   ID:  Jillyn Ledger, DOB 21-Apr-1950, MRN [id number]  PCP:  No PCP Per Patient  Cardiologist:  Dr. Shelva Majestic   Electrophysiologist:  n/a  Chief Complaint  Patient presents with  . Hospitalization Follow-up    s/p NSTEMI; ED visit   . Coronary Artery Disease     History of Present Illness: Vincent Flores is a 64 y.o. male with a hx of HTN, HL, ex-smoker. Admitted 10/21-10/24 with a non-STEMI. LHC demonstrated an occluded mid LAD with both right to left and left to left collaterals. He underwent PCI with a Synergy DES to the LAD. There was residual moderate nonobstructive disease in the LCx and RCA. EF was mildly reduced at cardiac catheterization. Follow-up echocardiogram demonstrated EF 35-40% with anteroseptal akinesis and mild diastolic dysfunction. He was placed on a medical regimen of aspirin, Brilinta, beta blocker, ACE inhibitor and statin. Long-acting nitrates were also added.  He presented back to the emergency room 10/27 with chest pain shortness of breath. Troponin levels continued to demonstrate downward trend. This was felt to be related to recent non-STEMI. He was seen by Dr. Meda Coffee and no further recommendations were made.  Returns for FU.  He is doing well. Denies chest pain, shortness of breath, syncope, orthopnea, PND or edema.   Studies/Reports Reviewed Today:  Echo 10/30/14 Moderate LVH, EF 35-40%, anteroseptal akinesis, grade 1 diastolic    LHC 93/81/82 LAD: Mid 100%, R-L and L-L collaterals  >>  PCI: 3 x 24 mm Synergy DES LCx: Proximal 30%, OM1 50%, 60% RCA: Mid 45%, distal 60% EF 45-50%  Non-ST elevation myocardial infarction with ongoing chest discomfort on presentation and elevated markers.  Total occlusion of the proximal to mid LAD with both right to left and left to left collaterals to the LAD, producing the non-STEMI presentation rather than ST elevation infarction.  Successful PTCA and stenting of  the LAD from 100% to 0% with TIMI grade 3 flow. Final post PCI balloon diameter 3.25 mm.   Moderate mid and distal right coronary, and first obtuse marginal.  Left ventricular systolic dysfunction with apical moderate hypokinesis. EF 50%. RECOMMENDATIONS:   Dual antiplatelet therapy  IV nitroglycerin for least 24 hours  ACE inhibitor therapy and initiation of beta blocker therapy  Opiates is for pain control  Potential 48 hour discharge assuming no significant arrhythmias or other acute complications.     Past Medical History  Diagnosis Date  . HTN (hypertension)   . Hyperlipidemia   . Shortness of breath   . Abnormal EKG     Past Surgical History  Procedure Laterality Date  . Appendectomy  1967  . Transthoracic echocardiogram  [id number]    normal global wall motion.  normal systolic global function.  calculated EF 61 %  . Cardiac catheterization N/A 10/29/2014    Procedure: Left Heart Cath and Coronary Angiography;  Surgeon: Belva Crome, MD;  Location: [facility];  Service: Cardiovascular;  Laterality: N/A;  . Cardiac catheterization N/A 10/29/2014    Procedure: Coronary Stent Intervention;  Surgeon: Belva Crome, MD;  Location: [facility];  Service: Cardiovascular;  Laterality: N/A;  . Coronary stent placement       Current Outpatient Prescriptions  Medication Sig Dispense Refill  . aspirin EC 81 MG tablet Take 81 mg by mouth daily.    Marland Kitchen atorvastatin (LIPITOR) 80 MG tablet Take 1 tablet (80 mg total) by mouth daily at 6  PM. 30 tablet 11  . carvedilol (COREG) 6.25 MG tablet Take 1 tablet (6.25 mg total) by mouth 2 (two) times daily with a meal. 60 tablet 11  . isosorbide mononitrate (IMDUR) 30 MG 24 hr tablet Take 1 tablet (30 mg total) by mouth daily. 30 tablet 11  . lansoprazole (PREVACID) 30 MG capsule Take 30 mg by mouth daily.     Marland Kitchen lisinopril (PRINIVIL,ZESTRIL) 10 MG tablet Take 1 tablet (10 mg total) by mouth daily. 30 tablet 11  .  nitroGLYCERIN (NITROSTAT) 0.4 MG SL tablet Place 1 tablet (0.4 mg total) under the tongue every 5 (five) minutes x 3 doses as needed for chest pain. 25 tablet 3  . pantoprazole (PROTONIX) 40 MG tablet Take 1 tablet (40 mg total) by mouth daily. 30 tablet 11  . ticagrelor (BRILINTA) 90 MG TABS tablet Take 1 tablet (90 mg total) by mouth 2 (two) times daily. 180 tablet 3   No current facility-administered medications for this visit.    Allergies:   Review of patient's allergies indicates no known allergies.    Social History:   Social History   Social History  . Marital Status: Married    Spouse Name: N/A  . Number of Children: N/A  . Years of Education: N/A   Social History Main Topics  . Smoking status: Former Smoker    Quit date: 01/08/2009  . Smokeless tobacco: Never Used  . Alcohol Use: 10.5 oz/week    21 Standard drinks or equivalent per week  . Drug Use: No  . Sexual Activity:    Partners: Female   Other Topics Concern  . None   Social History Narrative     Family History:   Family History  Problem Relation Age of Onset  . Heart disease Mother   . Cancer Father     Lung, was a smoker      ROS:   Please see the history of present illness.   Review of Systems  Cardiovascular: Positive for chest pain and dyspnea on exertion.  Respiratory: Positive for shortness of breath.   Gastrointestinal: Positive for hemorrhoids.  All other systems reviewed and are negative.     PHYSICAL EXAM: VS:  BP 100/70 mmHg  Pulse 69  Ht 5\' 6"  (1.676 m)  Wt 199 lb 3.2 oz (90.357 kg)  BMI 32.17 kg/m2    Wt Readings from Last 3 Encounters:  11/09/14 199 lb 3.2 oz (90.357 kg)  11/04/14 200 lb (90.719 kg)  11/01/14 198 lb (89.812 kg)     GEN: Well nourished, well developed, in no acute distress HEENT: normal Neck: no JVD, no masses Cardiac:  Normal S1/S2, RRR; no murmur ,  no rubs or gallops, no edema; right wrist without hematoma or mass  Respiratory:  clear to  auscultation bilaterally, no wheezing, rhonchi or rales. GI: soft, nontender, nondistended, + BS MS: no deformity or atrophy Skin: warm and dry  Neuro:  CNs II-XII intact, Strength and sensation are intact Psych: Normal affect   EKG:  EKG is ordered today.  It demonstrates:   NSR, HR 69, normal axis, T-wave inversions in V1-V6, QTc 415 ms, similar to prior tracings   Recent Labs: 12/04/2013: TSH 2.290 11/01/2014: ALT 30 11/04/2014: B Natriuretic Peptide 68.7; BUN 24*; Creatinine, Ser 1.15; Hemoglobin 15.8; Platelets 215; Potassium 4.3; Sodium 134*    Lipid Panel    Component Value Date/Time   CHOL 211* 10/30/2014 0445   CHOL 206* 12/04/2013 1013   TRIG 134  10/30/2014 0445   HDL 38* 10/30/2014 0445   HDL 46 12/04/2013 1013   CHOLHDL 5.6 10/30/2014 0445   CHOLHDL 4.5 12/04/2013 1013   VLDL 27 10/30/2014 0445   LDLCALC 146* 10/30/2014 0445   LDLCALC 137* 12/04/2013 1013      ASSESSMENT AND PLAN:  1. CAD: Status post non-STEMI treated with a DES to the LAD. He has residual moderate nonobstructive disease in the LCx and RCA that is treated medically. He is doing well without anginal symptoms. He will check with his insurance to see if he can get into cardiac rehabilitation. Continue aspirin, Brilinta, statin, beta blocker, ACE inhibitor, nitrates. We discussed the importance of dual antiplatelet therapy for a minimum of 1 year.  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 I cautioned him to avoid lifting over 20 pounds for the next 4 weeks.  2. Ischemic Cardiomyopathy: No evidence of volume excess. Continue beta blocker and ACE inhibitor. He will need follow-up echocardiogram 90 days post PCI.  3. Hypertension: Controlled.  4. Hyperlipidemia: Continue statin. Arrange lipids and LFTs in 6 weeks.    Medication Changes: Current medicines are reviewed at length with the patient today.  Concerns regarding medicines are as outlined above.  The following changes have been  made:   Discontinued Medications   No medications on file   Modified Medications   No medications on file   New Prescriptions   No medications on file   Labs/ tests ordered today include:   Orders Placed This Encounter  Procedures  . Lipid Profile  . Hepatic function panel  . EKG 12-Lead  . Echocardiogram      Disposition:    FU with Dr. Claiborne Billings in 6-8 weeks.     Signed, Versie Starks, MHS 11/09/2014 2:42 PM    [facility] [address], [place], [place]  [address] Phone: [phone]; Fax: [phone]

## 2014-11-09 ENCOUNTER — Encounter: Payer: Self-pay | Admitting: Physician Assistant

## 2014-11-09 ENCOUNTER — Ambulatory Visit (INDEPENDENT_AMBULATORY_CARE_PROVIDER_SITE_OTHER): Payer: BLUE CROSS/BLUE SHIELD | Admitting: Physician Assistant

## 2014-11-09 VITALS — BP 100/70 | HR 69 | Ht 66.0 in | Wt 199.2 lb

## 2014-11-09 DIAGNOSIS — I10 Essential (primary) hypertension: Secondary | ICD-10-CM

## 2014-11-09 DIAGNOSIS — E785 Hyperlipidemia, unspecified: Secondary | ICD-10-CM | POA: Diagnosis not present

## 2014-11-09 DIAGNOSIS — I2510 Atherosclerotic heart disease of native coronary artery without angina pectoris: Secondary | ICD-10-CM

## 2014-11-09 DIAGNOSIS — I255 Ischemic cardiomyopathy: Secondary | ICD-10-CM

## 2014-11-09 DIAGNOSIS — I252 Old myocardial infarction: Secondary | ICD-10-CM

## 2014-11-09 NOTE — Patient Instructions (Signed)
Medication Instructions:  Your physician recommends that you continue on your current medications as directed. Please refer to the Current Medication list given to you today.   Labwork: FASTING LIPID AND LIVER PANEL TO BE DONE IN 6 WEEKS  Testing/Procedures: Your physician has requested that you have an echocardiogram THIS IS TO BE DONE AFTER 01/29/15 WHICH WILL BE 90 DAYS FROM PCI. Echocardiography is a painless test that uses sound waves to create images of your heart. It provides your doctor with information about the size and shape of your heart and how well your heart's chambers and valves are working. This procedure takes approximately one hour. There are no restrictions for this procedure.   Follow-Up: 6-8 WEEKS WITH DR. Claiborne Billings  Any Other Special Instructions Will Be Listed Below (If Applicable).     If you need a refill on your cardiac medications before your next appointment, please call your pharmacy.

## 2014-11-12 ENCOUNTER — Ambulatory Visit (INDEPENDENT_AMBULATORY_CARE_PROVIDER_SITE_OTHER): Payer: BLUE CROSS/BLUE SHIELD | Admitting: Emergency Medicine

## 2014-11-12 VITALS — BP 102/60 | HR 78 | Temp 98.5°F | Resp 20 | Ht 66.0 in | Wt 197.2 lb

## 2014-11-12 DIAGNOSIS — S025XXB Fracture of tooth (traumatic), initial encounter for open fracture: Secondary | ICD-10-CM | POA: Diagnosis not present

## 2014-11-12 MED ORDER — AMOXICILLIN-POT CLAVULANATE 875-125 MG PO TABS: 1.0000 | ORAL_TABLET | Freq: Two times a day (BID) | ORAL | Status: DC

## 2014-11-12 MED ORDER — HYDROCODONE-ACETAMINOPHEN 5-325 MG PO TABS: 1.0000 | ORAL_TABLET | ORAL | Status: DC | PRN

## 2014-11-12 NOTE — Progress Notes (Signed)
Subjective:  Patient ID: Vincent Flores, male    DOB: 1950/09/13  Age: 64 y.o. MRN: [id number]  CC: OTHER   HPI [name] presents  with with pain in his right premolar. He thinks he has a fractured tooth. He has pain in the base of the season of the gum. He has no drainage. No fever or chills. No history of injury. He wears a plate that attaches on that tooth. He is unable to see his regular dentist today and is concerned about infection and the need for an antibiotic's recent  History Leeum has a past medical history of HTN (hypertension); Hyperlipidemia; Shortness of breath; and Abnormal EKG.   He has past surgical history that includes Appendectomy (5681); transthoracic echocardiogram (275170); Cardiac catheterization (N/A, 10/29/2014); Cardiac catheterization (N/A, 10/29/2014); and Coronary stent placement.   His  family history includes Cancer in his father; Heart disease in his mother.  He   reports that he quit smoking about 5 years ago. He has never used smokeless tobacco. He reports that he drinks about 10.5 oz of alcohol per week. He reports that he does not use illicit drugs.  Outpatient Prescriptions Prior to Visit  Medication Sig Dispense Refill  . aspirin EC 81 MG tablet Take 81 mg by mouth daily.    Marland Kitchen atorvastatin (LIPITOR) 80 MG tablet Take 1 tablet (80 mg total) by mouth daily at 6 PM. 30 tablet 11  . carvedilol (COREG) 6.25 MG tablet Take 1 tablet (6.25 mg total) by mouth 2 (two) times daily with a meal. 60 tablet 11  . isosorbide mononitrate (IMDUR) 30 MG 24 hr tablet Take 1 tablet (30 mg total) by mouth daily. 30 tablet 11  . lansoprazole (PREVACID) 30 MG capsule Take 30 mg by mouth daily.     Marland Kitchen lisinopril (PRINIVIL,ZESTRIL) 10 MG tablet Take 1 tablet (10 mg total) by mouth daily. 30 tablet 11  . nitroGLYCERIN (NITROSTAT) 0.4 MG SL tablet Place 1 tablet (0.4 mg total) under the tongue every 5 (five) minutes x 3 doses as needed for chest pain. 25 tablet 3  .  pantoprazole (PROTONIX) 40 MG tablet Take 1 tablet (40 mg total) by mouth daily. 30 tablet 11  . ticagrelor (BRILINTA) 90 MG TABS tablet Take 1 tablet (90 mg total) by mouth 2 (two) times daily. 180 tablet 3   No facility-administered medications prior to visit.    Social History   Social History  . Marital Status: Married    Spouse Name: N/A  . Number of Children: N/A  . Years of Education: N/A   Social History Main Topics  . Smoking status: Former Smoker    Quit date: 01/08/2009  . Smokeless tobacco: Never Used  . Alcohol Use: 10.5 oz/week    21 Standard drinks or equivalent per week  . Drug Use: No  . Sexual Activity:    Partners: Female   Other Topics Concern  . None   Social History Narrative     Review of Systems  Constitutional: Negative for fever, chills and appetite change.  HENT: Negative for congestion, ear pain, postnasal drip, sinus pressure and sore throat.   Eyes: Negative for pain and redness.  Respiratory: Negative for cough, shortness of breath and wheezing.   Cardiovascular: Negative for leg swelling.  Gastrointestinal: Negative for nausea, vomiting, abdominal pain, diarrhea, constipation and blood in stool.  Endocrine: Negative for polyuria.  Genitourinary: Negative for dysuria, urgency, frequency and flank pain.  Musculoskeletal: Negative for gait problem.  Skin: Negative for rash.  Neurological: Negative for weakness and headaches.  Psychiatric/Behavioral: Negative for confusion and decreased concentration. The patient is not nervous/anxious.     Objective:  BP 102/60 mmHg  Pulse 78  Temp(Src) 98.5 F (36.9 C) (Oral)  Resp 20  Ht 5\' 6"  (1.676 m)  Wt 197 lb 3.2 oz (89.449 kg)  BMI 31.84 kg/m2  SpO2 96%  Physical Exam  Constitutional: He is oriented to person, place, and time. He appears well-developed and well-nourished.  HENT:  Head: Normocephalic and atraumatic.  Mouth/Throat: Dental caries present.  Eyes: Conjunctivae are normal.  Pupils are equal, round, and reactive to light.  Pulmonary/Chest: Effort normal.  Musculoskeletal: He exhibits no edema.  Neurological: He is alert and oriented to person, place, and time.  Skin: Skin is dry.  Psychiatric: He has a normal mood and affect. His behavior is normal. Thought content normal.      Assessment & Plan:   Darrick was seen today for other.  Diagnoses and all orders for this visit:  Fracture, tooth, open, initial encounter  Other orders -     amoxicillin-clavulanate (AUGMENTIN) 875-125 MG tablet; Take 1 tablet by mouth 2 (two) times daily. -     HYDROcodone-acetaminophen (NORCO) 5-325 MG tablet; Take 1-2 tablets by mouth every 4 (four) hours as needed.   I am having Mr. Munday start on amoxicillin-clavulanate and HYDROcodone-acetaminophen. I am also having him maintain his aspirin EC, atorvastatin, carvedilol, isosorbide mononitrate, pantoprazole, lisinopril, nitroGLYCERIN, ticagrelor, and lansoprazole.  Meds ordered this encounter  Medications  . amoxicillin-clavulanate (AUGMENTIN) 875-125 MG tablet    Sig: Take 1 tablet by mouth 2 (two) times daily.    Dispense:  20 tablet    Refill:  0  . HYDROcodone-acetaminophen (NORCO) 5-325 MG tablet    Sig: Take 1-2 tablets by mouth every 4 (four) hours as needed.    Dispense:  30 tablet    Refill:  0    Appropriate red flag conditions were discussed with the patient as well as actions that should be taken.  Patient expressed his understanding.  Follow-up: Return if symptoms worsen or fail to improve.  Roselee Culver, MD

## 2014-11-12 NOTE — Patient Instructions (Signed)

## 2014-11-15 ENCOUNTER — Telehealth: Payer: Self-pay | Admitting: Cardiovascular Disease

## 2014-11-15 NOTE — Telephone Encounter (Signed)
Spoke with Dr. Romie Minus - oral surgeon. Patient is in the office and needs a tooth extracted. Patient is post stent ~ 1 month and is on Brilinta. Dr. Romie Minus states he is familiar with plavix and he does not require that patients hold this medication prior to dental extractions. Informed him brilinta is an anti-platelet medication such as plavix. He states he will not need patient to hold this medication. Informed him it would not be advised anyway, given recent stent. No further action necessary.

## 2014-12-24 ENCOUNTER — Ambulatory Visit (INDEPENDENT_AMBULATORY_CARE_PROVIDER_SITE_OTHER): Payer: BLUE CROSS/BLUE SHIELD | Admitting: Family Medicine

## 2014-12-24 VITALS — BP 116/78 | HR 77 | Temp 98.4°F | Resp 16 | Ht 66.0 in | Wt 190.0 lb

## 2014-12-24 DIAGNOSIS — N509 Disorder of male genital organs, unspecified: Secondary | ICD-10-CM | POA: Diagnosis not present

## 2014-12-24 DIAGNOSIS — R102 Pelvic and perineal pain: Secondary | ICD-10-CM

## 2014-12-24 DIAGNOSIS — L02215 Cutaneous abscess of perineum: Secondary | ICD-10-CM | POA: Diagnosis not present

## 2014-12-24 MED ORDER — CLINDAMYCIN HCL 300 MG PO CAPS: 300.0000 mg | ORAL_CAPSULE | Freq: Three times a day (TID) | ORAL | Status: DC

## 2014-12-24 NOTE — Progress Notes (Signed)
Patient ID: Vincent Flores, male    DOB: [birth year]  Age: 64 y.o. MRN: [id number]  Chief Complaint  Patient presents with  . Mass    on buttocks x 3 days     Subjective:   64 year old man who has had pain between his anus and scrotum for several days, getting worse with swelling of the tissues.  He had a heart catheterization and stent October 21 and is doing well. He is on anticoagulants because of that. He still has some hydrocodone pain pills at home.  Current allergies, medications, problem list, past/family and social histories reviewed.  Objective:  BP 116/78 mmHg  Pulse 77  Temp(Src) 98.4 F (36.9 C) (Oral)  Resp 16  Ht 5\' 6"  (1.676 m)  Wt 190 lb (86.183 kg)  BMI 30.68 kg/m2  SpO2 98%  No major distress. He has induration about 1 inch low the anus just to the right of midline but straddles both sides. Erythema. Nodular area about 3 cm's in diameter. Very tender.  Assessment & Plan:   Assessment: 1. Abscess, perineum   2. Pain in male perineum       Plan: Abscess of perineal area. Will require I&D and antibiotics. Due to the area decided to use clindamycin.  Orders Placed This Encounter  Procedures  . Wound culture    From right buttock    Meds ordered this encounter  Medications  . clindamycin (CLEOCIN) 300 MG capsule    Sig: Take 1 capsule (300 mg total) by mouth 3 (three) times daily.    Dispense:  30 capsule    Refill:  0         Patient Instructions  Take clindamycin 300 mg 3 times daily for infection.  Take a probiotic, as the pharmacist for a good one  Use the hydrocodone that you have at home if needed for pain    return in 2 days as directed  Please apply warm compresses 3-4 times daily- place a warm wet washcloth in a zippered bag and apply for 10-15 minutes  Incision and Drainage, Care After Refer to this sheet in the next few weeks. These instructions provide you with information on caring for yourself after your procedure. Your  caregiver may also give you more specific instructions. Your treatment has been planned according to current medical practices, but problems sometimes occur. Call your caregiver if you have any problems or questions after your procedure. HOME CARE INSTRUCTIONS   If antibiotic medicine is given, take it as directed. Finish it even if you start to feel better.  Only take over-the-counter or prescription medicines for pain, discomfort, or fever as directed by your caregiver.  Keep all follow-up appointments as directed by your caregiver.  Change any bandages (dressings) as directed by your caregiver. Replace old dressings with clean dressings.  Wash your hands before and after caring for your wound. You will receive specific instructions for cleansing and caring for your wound.  SEEK MEDICAL CARE IF:   You have increased pain, swelling, or redness around the wound.  You have increased drainage, smell, or bleeding from the wound.  You have muscle aches, chills, or you feel generally sick.  You have a fever. MAKE SURE YOU:   Understand these instructions.  Will watch your condition.  Will get help right away if you are not doing well or get worse.   This information is not intended to replace advice given to you by your health care provider. Make sure you discuss  any questions you have with your health care provider.   Document Released: 03/19/2011 Document Revised: 01/15/2014 Document Reviewed: 03/19/2011 Elsevier Interactive Patient Education [employer].      Return in about 2 days (around 12/26/2014).   HOPPER,DAVID, MD 12/24/2014

## 2014-12-24 NOTE — Progress Notes (Signed)
Patient ID: Vincent Flores, male   DOB: 10/17/1950, 64 y.o.   MRN: [id number] Procedure note- [facility]. Patient with NKDA. Local anesthesia obtained with 3 cc lidocaine 2% with epi. No 11 scalpel used to create 1.5 cm incision. Pustular drainage in moderate amount expressed. Area explored, no tracks appreciated. Packing inserted and dressing applied. Post procedure instructions provided written and verbal. Patient reports improvement in pain following procedure.

## 2014-12-24 NOTE — Patient Instructions (Addendum)
Take clindamycin 300 mg 3 times daily for infection.  Take a probiotic, as the pharmacist for a good one  Use the hydrocodone that you have at home if needed for pain    return in 2 days as directed  Please apply warm compresses 3-4 times daily- place a warm wet washcloth in a zippered bag and apply for 10-15 minutes  Incision and Drainage, Care After Refer to this sheet in the next few weeks. These instructions provide you with information on caring for yourself after your procedure. Your caregiver may also give you more specific instructions. Your treatment has been planned according to current medical practices, but problems sometimes occur. Call your caregiver if you have any problems or questions after your procedure. HOME CARE INSTRUCTIONS   If antibiotic medicine is given, take it as directed. Finish it even if you start to feel better.  Only take over-the-counter or prescription medicines for pain, discomfort, or fever as directed by your caregiver.  Keep all follow-up appointments as directed by your caregiver.  Change any bandages (dressings) as directed by your caregiver. Replace old dressings with clean dressings.  Wash your hands before and after caring for your wound. You will receive specific instructions for cleansing and caring for your wound.  SEEK MEDICAL CARE IF:   You have increased pain, swelling, or redness around the wound.  You have increased drainage, smell, or bleeding from the wound.  You have muscle aches, chills, or you feel generally sick.  You have a fever. MAKE SURE YOU:   Understand these instructions.  Will watch your condition.  Will get help right away if you are not doing well or get worse.   This information is not intended to replace advice given to you by your health care provider. Make sure you discuss any questions you have with your health care provider.   Document Released: 03/19/2011 Document Revised: 01/15/2014 Document  Reviewed: 03/19/2011 Elsevier Interactive Patient Education [employer].

## 2014-12-26 ENCOUNTER — Ambulatory Visit (INDEPENDENT_AMBULATORY_CARE_PROVIDER_SITE_OTHER): Payer: BLUE CROSS/BLUE SHIELD | Admitting: Physician Assistant

## 2014-12-26 VITALS — BP 132/80 | HR 77 | Temp 98.0°F | Resp 17 | Ht 66.0 in | Wt 191.0 lb

## 2014-12-26 DIAGNOSIS — L02215 Cutaneous abscess of perineum: Secondary | ICD-10-CM

## 2014-12-26 NOTE — Progress Notes (Signed)
[facility] [address], [place] [address] [phone]  Date:  12/26/2014   Name:  Vincent Flores   DOB:  [birth year]   MRN:  [id number]  PCP:  No PCP Per Patient    Chief Complaint: Dressing Change   History of Present Illness:  This is a 64 y.o. male who is presenting for wound care of abscess of perineum that underwent I&D on 12/16. He was placed on clindamycin 300 mg TID and he is tolerating well - no GI upset, he is taking with probiotic. He states the area is feeling much better and much less pain. He has changed the dressing twice. Drainage is decreasing. Wound culture not resulted yet. He denies fever or chills.  Review of Systems:  Review of Systems See HPI  Patient Active Problem List   Diagnosis Date Noted  . Essential hypertension 11/01/2014  . Hyperlipidemia 11/01/2014  . NSTEMI (non-ST elevated myocardial infarction) ([place]) 10/29/2014  . Abnormal EKG 02/14/2012    Prior to Admission medications   Medication Sig Start Date End Date Taking? Authorizing Provider  aspirin EC 81 MG tablet Take 81 mg by mouth daily.   Yes Historical Provider, MD  atorvastatin (LIPITOR) 80 MG tablet Take 1 tablet (80 mg total) by mouth daily at 6 PM. 11/01/14  Yes Almyra Deforest, PA  carvedilol (COREG) 6.25 MG tablet Take 1 tablet (6.25 mg total) by mouth 2 (two) times daily with a meal. 11/01/14  Yes Almyra Deforest, PA  clindamycin (CLEOCIN) 300 MG capsule Take 1 capsule (300 mg total) by mouth 3 (three) times daily. 12/24/14  Yes Posey Boyer, MD  HYDROcodone-acetaminophen (NORCO) 5-325 MG tablet Take 1-2 tablets by mouth every 4 (four) hours as needed. 11/12/14  Yes Roselee Culver, MD  isosorbide mononitrate (IMDUR) 30 MG 24 hr tablet Take 1 tablet (30 mg total) by mouth daily. 11/01/14  Yes Almyra Deforest, PA  lansoprazole (PREVACID) 30 MG capsule Take 30 mg by mouth daily.  10/26/14  Yes Historical Provider, MD  lisinopril (PRINIVIL,ZESTRIL) 10 MG tablet Take 1 tablet (10  mg total) by mouth daily. 11/01/14  Yes Almyra Deforest, PA  nitroGLYCERIN (NITROSTAT) 0.4 MG SL tablet Place 1 tablet (0.4 mg total) under the tongue every 5 (five) minutes x 3 doses as needed for chest pain. 11/01/14  Yes Almyra Deforest, PA  pantoprazole (PROTONIX) 40 MG tablet Take 1 tablet (40 mg total) by mouth daily. 11/01/14  Yes Almyra Deforest, PA  ticagrelor (BRILINTA) 90 MG TABS tablet Take 1 tablet (90 mg total) by mouth 2 (two) times daily. 11/01/14  Yes Almyra Deforest, PA    No Known Allergies  Past Surgical History  Procedure Laterality Date  . Appendectomy  1967  . Transthoracic echocardiogram  [id number]    normal global wall motion.  normal systolic global function.  calculated EF 61 %  . Cardiac catheterization N/A 10/29/2014    Procedure: Left Heart Cath and Coronary Angiography;  Surgeon: Belva Crome, MD;  Location: [facility] [facility];  Service: Cardiovascular;  Laterality: N/A;  . Cardiac catheterization N/A 10/29/2014    Procedure: Coronary Stent Intervention;  Surgeon: Belva Crome, MD;  Location: [place] [facility];  Service: Cardiovascular;  Laterality: N/A;  . Coronary stent placement      Social History  Substance Use Topics  . Smoking status: Former Smoker    Quit date: 01/08/2009  . Smokeless tobacco: Never Used  . Alcohol Use: 10.5 oz/week  21 Standard drinks or equivalent per week    Family History  Problem Relation Age of Onset  . Heart disease Mother   . Cancer Father     Lung, was a smoker    Medication list has been reviewed and updated.  Physical Examination:  Physical Exam  Constitutional: He is oriented to person, place, and time. He appears well-developed and well-nourished. No distress.  HENT:  Head: Normocephalic and atraumatic.  Right Ear: Hearing normal.  Left Ear: Hearing normal.  Nose: Nose normal.  Eyes: Conjunctivae and lids are normal. Right eye exhibits no discharge. Left eye exhibits no discharge. No scleral icterus.  Pulmonary/Chest: Effort  normal. No respiratory distress.  Musculoskeletal: Normal range of motion.  Neurological: He is alert and oriented to person, place, and time.  Skin: Skin is warm and dry.  Dressing and packing removed.  Location: perineum Incision size: 0.75 cm Wound depth: 1 cm Wound irrigated with 5 cc 1% lidocaine without epi No additional purulence. Mild induration. Mild pain to palp 1/4 inch packing loosely placed. Dressing replaced.   Psychiatric: He has a normal mood and affect. His speech is normal and behavior is normal. Thought content normal.   BP 132/80 mmHg  Pulse 77  Temp(Src) 98 F (36.7 C) (Oral)  Resp 17  Ht 5\' 6"  (1.676 m)  Wt 191 lb (86.637 kg)  BMI 30.84 kg/m2  SpO2 98%  Assessment and Plan:  1. Abscess, perineum Healing well, no additional purulence. Packing loosely placed. Return in 48 hours for wound check - suspect that will be his last. Continue in abx.   Benjaman Pott Drenda Freeze, MHS [facility]  12/26/2014

## 2014-12-27 LAB — WOUND CULTURE
Gram Stain: NONE SEEN
Gram Stain: NONE SEEN
Organism ID, Bacteria: NO GROWTH

## 2014-12-28 ENCOUNTER — Ambulatory Visit (INDEPENDENT_AMBULATORY_CARE_PROVIDER_SITE_OTHER): Payer: BLUE CROSS/BLUE SHIELD | Admitting: Physician Assistant

## 2014-12-28 VITALS — BP 108/72 | HR 75 | Temp 98.1°F | Resp 18 | Ht 66.0 in | Wt 191.0 lb

## 2014-12-28 DIAGNOSIS — L02215 Cutaneous abscess of perineum: Secondary | ICD-10-CM

## 2014-12-28 NOTE — Progress Notes (Signed)
[facility] [address], [place] [address] [phone]  Date:  12/28/2014   Name:  Vincent Flores   DOB:  01-04-1951   MRN:  [id number]  PCP:  No PCP Per Patient    Chief Complaint: Wound Check   History of Present Illness:  This is a 64 y.o. male who is presenting for wound care of abscess of perineum that underwent I&D on 12/16. This is his 2nd wound check. He was placed on clinda 300 TID. Wound care with no growth after 2 days. He is reporting today that he is no longer having pain and no longer with drainage. He changed his dressing his morning and thinks the packing fell out. He denies fever, chills. He is having some GI upset from clindamycin.  Review of Systems:  Review of Systems See HPI  Patient Active Problem List   Diagnosis Date Noted  . Essential hypertension 11/01/2014  . Hyperlipidemia 11/01/2014  . NSTEMI (non-ST elevated myocardial infarction) ([place]) 10/29/2014  . Abnormal EKG 02/14/2012    Prior to Admission medications   Medication Sig Start Date End Date Taking? Authorizing Provider  aspirin EC 81 MG tablet Take 81 mg by mouth daily.   Yes Historical Provider, MD  atorvastatin (LIPITOR) 80 MG tablet Take 1 tablet (80 mg total) by mouth daily at 6 PM. 11/01/14  Yes Almyra Deforest, PA  carvedilol (COREG) 6.25 MG tablet Take 1 tablet (6.25 mg total) by mouth 2 (two) times daily with a meal. 11/01/14  Yes Almyra Deforest, PA  clindamycin (CLEOCIN) 300 MG capsule Take 1 capsule (300 mg total) by mouth 3 (three) times daily. 12/24/14  Yes Posey Boyer, MD  HYDROcodone-acetaminophen (NORCO) 5-325 MG tablet Take 1-2 tablets by mouth every 4 (four) hours as needed. 11/12/14  Yes Roselee Culver, MD  isosorbide mononitrate (IMDUR) 30 MG 24 hr tablet Take 1 tablet (30 mg total) by mouth daily. 11/01/14  Yes Almyra Deforest, PA  lansoprazole (PREVACID) 30 MG capsule Take 30 mg by mouth daily.  10/26/14  Yes Historical Provider, MD  lisinopril  (PRINIVIL,ZESTRIL) 10 MG tablet Take 1 tablet (10 mg total) by mouth daily. 11/01/14  Yes Almyra Deforest, PA  nitroGLYCERIN (NITROSTAT) 0.4 MG SL tablet Place 1 tablet (0.4 mg total) under the tongue every 5 (five) minutes x 3 doses as needed for chest pain. 11/01/14  Yes Almyra Deforest, PA  pantoprazole (PROTONIX) 40 MG tablet Take 1 tablet (40 mg total) by mouth daily. 11/01/14  Yes Almyra Deforest, PA  ticagrelor (BRILINTA) 90 MG TABS tablet Take 1 tablet (90 mg total) by mouth 2 (two) times daily. 11/01/14  Yes Almyra Deforest, PA    No Known Allergies  Past Surgical History  Procedure Laterality Date  . Appendectomy  1967  . Transthoracic echocardiogram  [id number]    normal global wall motion.  normal systolic global function.  calculated EF 61 %  . Cardiac catheterization N/A 10/29/2014    Procedure: Left Heart Cath and Coronary Angiography;  Surgeon: Belva Crome, MD;  Location: [place] [facility];  Service: Cardiovascular;  Laterality: N/A;  . Cardiac catheterization N/A 10/29/2014    Procedure: Coronary Stent Intervention;  Surgeon: Belva Crome, MD;  Location: [place] [facility];  Service: Cardiovascular;  Laterality: N/A;  . Coronary stent placement      Social History  Substance Use Topics  . Smoking status: Former Smoker    Quit date: 01/08/2009  . Smokeless tobacco:  Never Used  . Alcohol Use: 10.5 oz/week    21 Standard drinks or equivalent per week    Family History  Problem Relation Age of Onset  . Heart disease Mother   . Cancer Father     Lung, was a smoker    Medication list has been reviewed and updated.  Physical Examination:  Physical Exam  Constitutional: He is oriented to person, place, and time. He appears well-developed and well-nourished. No distress.  HENT:  Head: Normocephalic and atraumatic.  Right Ear: Hearing normal.  Left Ear: Hearing normal.  Nose: Nose normal.  Eyes: Conjunctivae and lids are normal. Right eye exhibits no discharge. Left eye exhibits no  discharge. No scleral icterus.  Pulmonary/Chest: Effort normal. No respiratory distress.  Musculoskeletal: Normal range of motion.  Neurological: He is alert and oriented to person, place, and time.  Skin: Skin is warm and dry.  Dressing and packing removed.  Location: perineum Incision size: 0.75 cm Wound depth: 0.25-0.5 cm No erythema or induration. Not ttp. Wound irrigated with 5 cc 1% lidocaine without epi No additional purulence. No packing placed. Dressing replaced.   Psychiatric: He has a normal mood and affect. His speech is normal and behavior is normal. Thought content normal.   BP 108/72 mmHg  Pulse 75  Temp(Src) 98.1 F (36.7 C) (Oral)  Resp 18  Ht 5\' 6"  (1.676 m)  Wt 191 lb (86.637 kg)  BMI 30.84 kg/m2  SpO2 98%  Assessment and Plan:  1. Abscess, perineum Healing very well. Wound depth much more shallow. Did not require packing. Wound dressed. I advised he stop clinda d/t gi upset and no growth on wound culture.   Benjaman Pott Drenda Freeze, MHS [facility]  12/28/2014

## 2015-01-07 ENCOUNTER — Other Ambulatory Visit (INDEPENDENT_AMBULATORY_CARE_PROVIDER_SITE_OTHER): Payer: BLUE CROSS/BLUE SHIELD | Admitting: *Deleted

## 2015-01-07 DIAGNOSIS — E785 Hyperlipidemia, unspecified: Secondary | ICD-10-CM

## 2015-01-07 LAB — HEPATIC FUNCTION PANEL
ALT: 34 U/L (ref 9–46)
AST: 25 U/L (ref 10–35)
Albumin: 4.5 g/dL (ref 3.6–5.1)
Alkaline Phosphatase: 77 U/L (ref 40–115)
Bilirubin, Direct: 0.2 mg/dL (ref <=0.2)
Indirect Bilirubin: 0.6 mg/dL (ref 0.2–1.2)
Total Bilirubin: 0.8 mg/dL (ref 0.2–1.2)
Total Protein: 7.2 g/dL (ref 6.1–8.1)

## 2015-01-07 LAB — LIPID PANEL
Cholesterol: 112 mg/dL — ABNORMAL LOW (ref 125–200)
HDL: 44 mg/dL (ref >=40)
LDL Cholesterol: 53 mg/dL (ref <130)
Total CHOL/HDL Ratio: 2.5 Ratio (ref <=5.0)
Triglycerides: 77 mg/dL (ref <150)
VLDL: 15 mg/dL (ref <30)

## 2015-01-07 NOTE — Addendum Note (Signed)
Addended by: Eulis Foster on: 01/07/2015 08:16 AM   Modules accepted: Orders

## 2015-01-11 ENCOUNTER — Telehealth: Payer: Self-pay | Admitting: *Deleted

## 2015-01-11 NOTE — Telephone Encounter (Signed)
Lmptcb for lab results 

## 2015-01-11 NOTE — Telephone Encounter (Signed)
Pt notified of lab results by phone with verbal understanding.  

## 2015-01-14 ENCOUNTER — Ambulatory Visit (INDEPENDENT_AMBULATORY_CARE_PROVIDER_SITE_OTHER): Payer: BLUE CROSS/BLUE SHIELD | Admitting: Cardiovascular Disease

## 2015-01-14 VITALS — BP 122/72 | HR 63 | Ht 66.0 in | Wt 193.2 lb

## 2015-01-14 DIAGNOSIS — I214 Non-ST elevation (NSTEMI) myocardial infarction: Secondary | ICD-10-CM | POA: Diagnosis not present

## 2015-01-14 DIAGNOSIS — K219 Gastro-esophageal reflux disease without esophagitis: Secondary | ICD-10-CM | POA: Diagnosis not present

## 2015-01-14 DIAGNOSIS — E785 Hyperlipidemia, unspecified: Secondary | ICD-10-CM | POA: Diagnosis not present

## 2015-01-14 DIAGNOSIS — I10 Essential (primary) hypertension: Secondary | ICD-10-CM

## 2015-01-14 NOTE — Patient Instructions (Signed)
Your physician recommends that you schedule a follow-up appointment in: 3 MONTHS Dr Claiborne Billings

## 2015-01-15 ENCOUNTER — Encounter: Payer: Self-pay | Admitting: Cardiovascular Disease

## 2015-01-15 DIAGNOSIS — K219 Gastro-esophageal reflux disease without esophagitis: Secondary | ICD-10-CM | POA: Insufficient documentation

## 2015-01-15 NOTE — Progress Notes (Signed)
Patient ID: Vincent Flores, male   DOB: 21-Sep-1950, 65 y.o.   MRN: [id number]     HPI: Vincent Flores is a 65 y.o. male who presents to the office today for a 2 month follow up cardiology evaluation following his recent hospitalization.  Vincent Flores is a 65 year old gentleman who has a 40 year history of tobacco use , a history of hyperlipidemia and hypertension. On the morning of 10/29/2014, he was awakened at 3 AM with chest tightness radiating to his back and associated with mild diaphoresis and dyspnea.  He presented to the emergency room.  A chest CT was negative for PE and aortic dissection. A chest x-ray suggested early T-wave peaking in less than 0.5 mm J-point elevation in lead 3.  Troponin was positive for non-ST segment elevation MI at 2.17.  He was admitted and underwent cardiac catheterization which was done by Dr. Tamala Julian.  This revealed total occlusion of the proximal to mid LAD with both right to left and left to left collaterals.  He underwent successful PCI with stenting of his LAD  With a synergy 3.023 mm stent postdilated to 3.25 mm with the100% occlusion be reduced to 0% and return of TIMI-3 flow.  He had concomitant CAD with 45, mid, and 60% distal RCA stenoses, and 60 and 50% marginal stenoses.  Since hospital discharge, he has felt well.  He denies recurrent chest tightness or pressure. At times he has noticed some sensation of a mild chest muscle ache. He presents for evaluation.  Past Medical History  Diagnosis Date  . HTN (hypertension)   . Hyperlipidemia   . Shortness of breath   . Abnormal EKG     Past Surgical History  Procedure Laterality Date  . Appendectomy  1967  . Transthoracic echocardiogram  [id number]    normal global wall motion.  normal systolic global function.  calculated EF 61 %  . Cardiac catheterization N/A 10/29/2014    Procedure: Left Heart Cath and Coronary Angiography;  Surgeon: Belva Crome, MD;  Location: [facility];  Service: Cardiovascular;   Laterality: N/A;  . Cardiac catheterization N/A 10/29/2014    Procedure: Coronary Stent Intervention;  Surgeon: Belva Crome, MD;  Location: [facility];  Service: Cardiovascular;  Laterality: N/A;  . Coronary stent placement      No Known Allergies  Current Outpatient Prescriptions  Medication Sig Dispense Refill  . aspirin EC 81 MG tablet Take 81 mg by mouth daily.    Marland Kitchen atorvastatin (LIPITOR) 80 MG tablet Take 1 tablet (80 mg total) by mouth daily at 6 PM. 30 tablet 11  . carvedilol (COREG) 6.25 MG tablet Take 1 tablet (6.25 mg total) by mouth 2 (two) times daily with a meal. 60 tablet 11  . isosorbide mononitrate (IMDUR) 30 MG 24 hr tablet Take 1 tablet (30 mg total) by mouth daily. 30 tablet 11  . lansoprazole (PREVACID) 30 MG capsule Take 30 mg by mouth daily.     Marland Kitchen lisinopril (PRINIVIL,ZESTRIL) 10 MG tablet Take 1 tablet (10 mg total) by mouth daily. 30 tablet 11  . nitroGLYCERIN (NITROSTAT) 0.4 MG SL tablet Place 1 tablet (0.4 mg total) under the tongue every 5 (five) minutes x 3 doses as needed for chest pain. 25 tablet 3  . pantoprazole (PROTONIX) 40 MG tablet Take 1 tablet (40 mg total) by mouth daily. 30 tablet 11  . ticagrelor (BRILINTA) 90 MG TABS tablet Take 1 tablet (90 mg total) by mouth 2 (two)  times daily. 180 tablet 3   No current facility-administered medications for this visit.    Social History   Social History  . Marital Status: Married    Spouse Name: N/A  . Number of Children: N/A  . Years of Education: N/A   Occupational History  . Not on file.   Social History Main Topics  . Smoking status: Former Smoker    Quit date: 01/08/2009  . Smokeless tobacco: Never Used  . Alcohol Use: 10.5 oz/week    21 Standard drinks or equivalent per week  . Drug Use: No  . Sexual Activity:    Partners: Female   Other Topics Concern  . Not on file   Social History Narrative    Family History  Problem Relation Age of Onset  . Heart disease Mother   .  Cancer Father     Lung, was a smoker    ROS General: Negative; No fevers, chills, or night sweats HEENT: Negative; No changes in vision or hearing, sinus congestion, difficulty swallowing Pulmonary: Negative; No cough, wheezing, shortness of breath, hemoptysis Cardiovascular: See HPI: No chest pain, presyncope, syncope, palpatations GI: Negative; No nausea, vomiting, diarrhea, or abdominal pain GU: Negative; No dysuria, hematuria, or difficulty voiding Musculoskeletal: Negative; no myalgias, joint pain, or weakness Hematologic: Negative; no easy bruising, bleeding Endocrine: Negative; no heat/cold intolerance; no diabetes, Neuro: Negative; no changes in balance, headaches Skin: Negative; No rashes or skin lesions Psychiatric: Negative; No behavioral problems, depression Sleep: Negative; No snoring,  daytime sleepiness, hypersomnolence, bruxism, restless legs, hypnogognic hallucinations. Other comprehensive 14 point system review is negative   Physical Exam BP 122/72 mmHg  Pulse 63  Ht 5' 6"  (1.676 m)  Wt 193 lb 3.2 oz (87.635 kg)  BMI 31.20 kg/m2   Blood pressure by me 110/70 supine and 122/70 standing.   Wt Readings from Last 3 Encounters:  01/14/15 193 lb 3.2 oz (87.635 kg)  12/28/14 191 lb (86.637 kg)  12/26/14 191 lb (86.637 kg)   General: Alert, oriented, no distress.  Skin: normal turgor, no rashes, warm and dry HEENT: Normocephalic, atraumatic. Pupils equal round and reactive to light; sclera anicteric; extraocular muscles intact, No lid lag; Nose without nasal septal hypertrophy; Mouth/Parynx benign; Mallinpatti scale 3 Neck: No JVD, no carotid bruits; normal carotid upstroke Lungs: clear to ausculatation and percussion bilaterally; no wheezing or rales, normal inspiratory and expiratory effort Chest wall: without tenderness to palpitation Heart: PMI not displaced, RRR, s1 s2 normal, 1/6 systolic murmur, No diastolic murmur, no rubs, gallops, thrills, or  heaves Abdomen: soft, nontender; no hepatosplenomehaly, BS+; abdominal aorta nontender and not dilated by palpation. Back: no CVA tenderness Pulses: 2+  Musculoskeletal: full range of motion, normal strength, no joint deformities Extremities: Pulses 2+, no clubbing cyanosis or edema, Homan's sign negative  Neurologic: grossly nonfocal; Cranial nerves grossly wnl Psychologic: Normal mood and affect   ECG (independently read by me):  Normal sinus rhythm at 63 bpm.  Q wave present in lead 3. No significant ST-T changes.  LABS:  BMP Latest Ref Rng 11/04/2014 11/01/2014 10/30/2014  Glucose 65 - 99 mg/dL 114(H) 111(H) 114(H)  BUN 6 - 20 mg/dL 24(H) 16 9  Creatinine 0.61 - 1.24 mg/dL 1.15 1.18 0.92  BUN/Creat Ratio 10 - 22 - - -  Sodium 135 - 145 mmol/L 134(L) 141 132(L)  Potassium 3.5 - 5.1 mmol/L 4.3 4.6 5.0  Chloride 101 - 111 mmol/L 102 106 102  CO2 22 - 32 mmol/L 23 27 23  Calcium 8.9 - 10.3 mg/dL 9.7 9.5 8.1(L)     Hepatic Function Latest Ref Rng 01/07/2015 11/01/2014 10/29/2014  Total Protein 6.1 - 8.1 g/dL 7.2 6.4(L) 6.0(L)  Albumin 3.6 - 5.1 g/dL 4.5 3.5 3.4(L)  AST 10 - 35 U/L 25 32 110(H)  ALT 9 - 46 U/L 34 30 37  Alk Phosphatase 40 - 115 U/L 77 67 67  Total Bilirubin 0.2 - 1.2 mg/dL 0.8 1.3(H) 0.7  Bilirubin, Direct <=0.2 mg/dL 0.2 0.2 0.1    CBC Latest Ref Rng 11/04/2014 10/30/2014 10/29/2014  WBC 4.0 - 10.5 K/uL 8.9 11.7(H) 9.5  Hemoglobin 13.0 - 17.0 g/dL 15.8 13.7 14.1  Hematocrit 39.0 - 52.0 % 44.7 39.7 40.8  Platelets 150 - 400 K/uL 215 157 147(L)   Lab Results  Component Value Date   MCV 98.9 11/04/2014   MCV 99.0 10/30/2014   MCV 99.3 10/29/2014    Lab Results  Component Value Date   TSH 2.290 12/04/2013    BNP    Component Value Date/Time   BNP 68.7 11/04/2014 0744    ProBNP No results found for: PROBNP   Lipid Panel     Component Value Date/Time   CHOL 112* 01/07/2015 0816   CHOL 206* 12/04/2013 1013   TRIG 77 01/07/2015 0816   HDL  44 01/07/2015 0816   HDL 46 12/04/2013 1013   CHOLHDL 2.5 01/07/2015 0816   CHOLHDL 4.5 12/04/2013 1013   VLDL 15 01/07/2015 0816   LDLCALC 53 01/07/2015 0816   LDLCALC 137* 12/04/2013 1013     RADIOLOGY: No results found.    ASSESSMENT AND PLAN:  Mr. Vincent Flores is a 65 year old white male who suffered a non-ST segment elevation MI on 10/29/2014 was found to have total LAD occlusion, but fortunately had collaterals to his LAD from both right to  Left and left to left sources is ultimately in a non-ST segment MI rather than an ST segment elevation MI.  His blood pressure today is stable on lisinopril at 10 mg and carvedilol 6.25 mg twice a day.  Resting heart rate is 63.  He continues to be on dual antiplatelet therapy with aspirin and Brilinta.  He is now on high potency statin with atorvastatin statin at 80 mg with significant improvement in his lipid status from his initial presentation.  LDL is now 53.  He is on Protonix for GERD.  Clinically he is doing well.  He will continue dual antiplatelet therapy for a minimum of a year with his MI.  I will see him in 3 months for cardiology reevaluation.  Time spent: 25 minutes  Troy Sine, MD, [facility]  01/15/2015 1:49 PM

## 2015-02-07 ENCOUNTER — Other Ambulatory Visit: Payer: Self-pay

## 2015-02-07 ENCOUNTER — Ambulatory Visit (HOSPITAL_COMMUNITY): Payer: BLUE CROSS/BLUE SHIELD | Attending: Physician Assistant

## 2015-02-07 DIAGNOSIS — I5189 Other ill-defined heart diseases: Secondary | ICD-10-CM | POA: Insufficient documentation

## 2015-02-07 DIAGNOSIS — E785 Hyperlipidemia, unspecified: Secondary | ICD-10-CM | POA: Insufficient documentation

## 2015-02-07 DIAGNOSIS — I10 Essential (primary) hypertension: Secondary | ICD-10-CM | POA: Diagnosis not present

## 2015-02-07 DIAGNOSIS — I517 Cardiomegaly: Secondary | ICD-10-CM | POA: Diagnosis not present

## 2015-02-07 DIAGNOSIS — I2510 Atherosclerotic heart disease of native coronary artery without angina pectoris: Secondary | ICD-10-CM | POA: Insufficient documentation

## 2015-02-08 ENCOUNTER — Encounter: Payer: Self-pay | Admitting: Physician Assistant

## 2015-05-18 ENCOUNTER — Encounter: Payer: Self-pay | Admitting: Physician Assistant

## 2015-05-18 DIAGNOSIS — K649 Unspecified hemorrhoids: Secondary | ICD-10-CM | POA: Insufficient documentation

## 2015-08-01 ENCOUNTER — Encounter: Payer: Self-pay | Admitting: Cardiovascular Disease

## 2015-08-01 ENCOUNTER — Ambulatory Visit (INDEPENDENT_AMBULATORY_CARE_PROVIDER_SITE_OTHER): Payer: BLUE CROSS/BLUE SHIELD | Admitting: Cardiovascular Disease

## 2015-08-01 VITALS — BP 120/80 | HR 76 | Ht 66.0 in | Wt 191.0 lb

## 2015-08-01 DIAGNOSIS — E785 Hyperlipidemia, unspecified: Secondary | ICD-10-CM

## 2015-08-01 DIAGNOSIS — I2510 Atherosclerotic heart disease of native coronary artery without angina pectoris: Secondary | ICD-10-CM

## 2015-08-01 DIAGNOSIS — I252 Old myocardial infarction: Secondary | ICD-10-CM

## 2015-08-01 DIAGNOSIS — I10 Essential (primary) hypertension: Secondary | ICD-10-CM

## 2015-08-01 DIAGNOSIS — K219 Gastro-esophageal reflux disease without esophagitis: Secondary | ICD-10-CM

## 2015-08-01 DIAGNOSIS — Z79899 Other long term (current) drug therapy: Secondary | ICD-10-CM | POA: Diagnosis not present

## 2015-08-01 MED ORDER — LISINOPRIL 10 MG PO TABS
5.0000 mg | ORAL_TABLET | Freq: Every day | ORAL | 11 refills | Status: DC
Start: 2015-08-01 — End: 2016-08-30

## 2015-08-01 NOTE — Patient Instructions (Signed)
Your physician has recommended you make the following change in your medication:   1.) the lisinpopril has been decreased to 5 mg daily. ( 1/2 of the 10 mg tablets)  Your physician recommends that you return for lab work prior to your next visit in November.  Your physician recommends that you schedule a follow-up appointment in: November.

## 2015-08-02 ENCOUNTER — Encounter: Payer: Self-pay | Admitting: Cardiovascular Disease

## 2015-08-02 NOTE — Progress Notes (Signed)
Patient ID: Vincent Flores, male   DOB: [birth year], 65 y.o.   MRN: [id number]     Primary M.D.: Dr. Arlyss Queen  HPI: Vincent Flores is a 65 y.o. male who presents to the office today for a 6 month follow up cardiology evaluation.  Vincent Flores has  a history of hyperlipidemia and hypertension. On the morning of 10/29/2014, he was awakened at 3 AM with chest tightness radiating to his back and associated with mild diaphoresis and dyspnea.  He presented to the emergency room.  A chest CT was negative for PE and aortic dissection. A chest x-ray suggested early T-wave peaking in less than 0.5 mm J-point elevation in lead 3.  Troponin was positive for non-ST segment elevation MI at 2.17.  He was admitted and underwent cardiac catheterization which was done by Dr. Tamala Julian.  This revealed total occlusion of the proximal to mid LAD with both right to left and left to left collaterals.  He underwent successful PCI with stenting of his LAD with a Synergy 3.023 mm stent postdilated to 3.25 mm with the100% occlusion be reduced to 0% and return of TIMI-3 flow.  He had concomitant CAD with 40 - 50% mid, and 60% distal RCA stenoses, and 60 and 50% marginal stenoses.  Since hospital discharge, he has felt well.  He denies recurrent chest tightness or pressure.   He underwent an echo Doppler study on 02/07/2015.  This showed mild LVH with normalization of LV function with an ejection fraction of 60-65%.  There was grade 1 diastolic dysfunction.  Since I last saw him, he continues to do well.  He admits to easy bruisability on aspirin and brilinta.  He is unaware of palpitations.  He admits to fatigue with extreme heat.  If he stands abruptly he may note minimal dizziness.  He presents for evaluation.  Past Medical History:  Diagnosis Date  . CAD (coronary artery disease)    a. NSTEMI 10/16: mid LAD 100% >> PCI with Synergy DES  . HTN (hypertension)   . Hyperlipidemia   . Ischemic cardiomyopathy    a. Echo 10/16:  Moderate LVH, EF 35-40%, anteroseptal akinesis, grade 1 diastolic;  b. Echo 3/38: mild LVH, EF 60-65%, no RWMA, Gr 1 DD.    Past Surgical History:  Procedure Laterality Date  . APPENDECTOMY  1967  . CARDIAC CATHETERIZATION N/A 10/29/2014   Procedure: Left Heart Cath and Coronary Angiography;  Surgeon: Belva Crome, MD;  Location: [facility];  Service: Cardiovascular;  Laterality: N/A;  . CARDIAC CATHETERIZATION N/A 10/29/2014   Procedure: Coronary Stent Intervention;  Surgeon: Belva Crome, MD;  Location: [facility];  Service: Cardiovascular;  Laterality: N/A;  . CORONARY STENT PLACEMENT    . TRANSTHORACIC ECHOCARDIOGRAM  250539   normal global wall motion.  normal systolic global function.  calculated EF 61 %    No Known Allergies  Current Outpatient Prescriptions  Medication Sig Dispense Refill  . aspirin EC 81 MG tablet Take 81 mg by mouth daily.    Marland Kitchen atorvastatin (LIPITOR) 80 MG tablet Take 1 tablet (80 mg total) by mouth daily at 6 PM. 30 tablet 11  . carvedilol (COREG) 6.25 MG tablet Take 1 tablet (6.25 mg total) by mouth 2 (two) times daily with a meal. 60 tablet 11  . isosorbide mononitrate (IMDUR) 30 MG 24 hr tablet Take 1 tablet (30 mg total) by mouth daily. 30 tablet 11  . lansoprazole (PREVACID) 30 MG capsule Take 30  mg by mouth daily.     Marland Kitchen lisinopril (PRINIVIL,ZESTRIL) 10 MG tablet Take 0.5 tablets (5 mg total) by mouth daily. 30 tablet 11  . nitroGLYCERIN (NITROSTAT) 0.4 MG SL tablet Place 1 tablet (0.4 mg total) under the tongue every 5 (five) minutes x 3 doses as needed for chest pain. 25 tablet 3  . pantoprazole (PROTONIX) 40 MG tablet Take 1 tablet (40 mg total) by mouth daily. 30 tablet 11  . ticagrelor (BRILINTA) 90 MG TABS tablet Take 1 tablet (90 mg total) by mouth 2 (two) times daily. 180 tablet 3   No current facility-administered medications for this visit.     Social History   Social History  . Marital status: Married    Spouse name:  N/A  . Number of children: N/A  . Years of education: N/A   Occupational History  . Not on file.   Social History Main Topics  . Smoking status: Former Smoker    Quit date: 01/08/2009  . Smokeless tobacco: Never Used  . Alcohol use 10.5 oz/week    21 Standard drinks or equivalent per week  . Drug use: No  . Sexual activity: Yes    Partners: Female   Other Topics Concern  . Not on file   Social History Narrative  . No narrative on file    Family History  Problem Relation Age of Onset  . Heart disease Mother   . Cancer Father     Lung, was a smoker    ROS General: Negative; No fevers, chills, or night sweats HEENT: Negative; No changes in vision or hearing, sinus congestion, difficulty swallowing Pulmonary: Negative; No cough, wheezing, shortness of breath, hemoptysis Cardiovascular: See HPI: No chest pain, presyncope, syncope, palpatations GI: Negative; No nausea, vomiting, diarrhea, or abdominal pain GU: Negative; No dysuria, hematuria, or difficulty voiding Musculoskeletal: Negative; no myalgias, joint pain, or weakness Hematologic: Negative; no easy bruising, bleeding Endocrine: Negative; no heat/cold intolerance; no diabetes, Neuro: Negative; no changes in balance, headaches Skin: Negative; No rashes or skin lesions Psychiatric: Negative; No behavioral problems, depression Sleep: Negative; No snoring,  daytime sleepiness, hypersomnolence, bruxism, restless legs, hypnogognic hallucinations. Other comprehensive 14 point system review is negative   Physical Exam BP 120/80   Pulse 76   Ht '5\' 6"'$  (1.676 m)   Wt 191 lb (86.6 kg)   BMI 30.83 kg/m    Blood pressure by me 110/70 supine and 114/70 standing.   Wt Readings from Last 3 Encounters:  08/01/15 191 lb (86.6 kg)  01/14/15 193 lb 3.2 oz (87.6 kg)  12/28/14 191 lb (86.6 kg)   General: Alert, oriented, no distress.  Skin: normal turgor, no rashes, warm and dry HEENT: Normocephalic, atraumatic. Pupils equal  round and reactive to light; sclera anicteric; extraocular muscles intact, No lid lag; Nose without nasal septal hypertrophy; Mouth/Parynx benign; Mallinpatti scale 3 Neck: No JVD, no carotid bruits; normal carotid upstroke Lungs: clear to ausculatation and percussion bilaterally; no wheezing or rales, normal inspiratory and expiratory effort Chest wall: without tenderness to palpitation Heart: PMI not displaced, RRR, s1 s2 normal, 1/6 systolic murmur, No diastolic murmur, no rubs, gallops, thrills, or heaves Abdomen: soft, nontender; no hepatosplenomehaly, BS+; abdominal aorta nontender and not dilated by palpation. Back: no CVA tenderness Pulses: 2+  Musculoskeletal: full range of motion, normal strength, no joint deformities Extremities: Pulses 2+, no clubbing cyanosis or edema, Homan's sign negative  Neurologic: grossly nonfocal; Cranial nerves grossly wnl Psychologic: Normal mood and affect  ECG (  independently read by me): Normal sinus rhythm at 76 bpm.  Q wave in lead 3.  No ectopy.  January 2017 ECG (independently read by me):  Normal sinus rhythm at 63 bpm.  Q wave present in lead 3. No significant ST-T changes.  LABS:  BMP Latest Ref Rng & Units 11/04/2014 11/01/2014 10/30/2014  Glucose 65 - 99 mg/dL 114(H) 111(H) 114(H)  BUN 6 - 20 mg/dL 24(H) 16 9  Creatinine 0.61 - 1.24 mg/dL 1.15 1.18 0.92  BUN/Creat Ratio 10 - 22 - - -  Sodium 135 - 145 mmol/L 134(L) 141 132(L)  Potassium 3.5 - 5.1 mmol/L 4.3 4.6 5.0  Chloride 101 - 111 mmol/L 102 106 102  CO2 22 - 32 mmol/L 23 27 23   Calcium 8.9 - 10.3 mg/dL 9.7 9.5 8.1(L)     Hepatic Function Latest Ref Rng & Units 01/07/2015 11/01/2014 10/29/2014  Total Protein 6.1 - 8.1 g/dL 7.2 6.4(L) 6.0(L)  Albumin 3.6 - 5.1 g/dL 4.5 3.5 3.4(L)  AST 10 - 35 U/L 25 32 110(H)  ALT 9 - 46 U/L 34 30 37  Alk Phosphatase 40 - 115 U/L 77 67 67  Total Bilirubin 0.2 - 1.2 mg/dL 0.8 1.3(H) 0.7  Bilirubin, Direct <=0.2 mg/dL 0.2 0.2 0.1    CBC  Latest Ref Rng & Units 11/04/2014 10/30/2014 10/29/2014  WBC 4.0 - 10.5 K/uL 8.9 11.7(H) 9.5  Hemoglobin 13.0 - 17.0 g/dL 15.8 13.7 14.1  Hematocrit 39.0 - 52.0 % 44.7 39.7 40.8  Platelets 150 - 400 K/uL 215 157 147(L)   Lab Results  Component Value Date   MCV 98.9 11/04/2014   MCV 99.0 10/30/2014   MCV 99.3 10/29/2014    Lab Results  Component Value Date   TSH 2.290 12/04/2013    BNP    Component Value Date/Time   BNP 68.7 11/04/2014 0744    ProBNP No results found for: PROBNP   Lipid Panel     Component Value Date/Time   CHOL 112 (L) 01/07/2015 0816   CHOL 206 (H) 12/04/2013 1013   TRIG 77 01/07/2015 0816   HDL 44 01/07/2015 0816   HDL 46 12/04/2013 1013   CHOLHDL 2.5 01/07/2015 0816   VLDL 15 01/07/2015 0816   LDLCALC 53 01/07/2015 0816   LDLCALC 137 (H) 12/04/2013 1013     RADIOLOGY: No results found.    ASSESSMENT AND PLAN:  Vincent Flores is a 65 year old white male who suffered a non-ST segment elevation MI on 10/29/2014 was found to have total LAD occlusion, but fortunately had collaterals to his LAD from both right to left and left to left sources which ultimately in his non-STEMI rather than ST segment elevation MI.  His LV function is entirely normal on echo Doppler study in January 2017.  His blood pressure today is mildly on the low side and if he stands abruptly.  He may note some transient dizziness.  For this reason, I'm reducing his lisinopril from 10 mg to 5 mg daily.  He will continue his current dose of isosorbide mononitrate 30 mg, and carvedilol 6.25 mg twice a day.  He is on hypodense he statin therapy with atorvastatin 80 mg daily with improvement in LDL cholesterol from 137 to 53.  I discussed with him the importance of staying on dual antiplatelet therapy for minimum of one-year.  He does note some easy bruisability undoubtedly contributed by DAPT.  He is not having any significant GERD symptoms on his current dose of pantoprazole 40  mg  daily.  I will see him in November 2017 for follow-up evaluation and prior to that office visit repeat blood work will be obtained.  Time spent: 25 minutes  Troy Sine, MD, [facility]  08/02/2015 4:36 PM

## 2015-10-15 ENCOUNTER — Emergency Department (HOSPITAL_COMMUNITY): Payer: BLUE CROSS/BLUE SHIELD

## 2015-10-15 ENCOUNTER — Emergency Department (HOSPITAL_COMMUNITY)
Admission: EM | Admit: 2015-10-15 | Discharge: 2015-10-15 | Disposition: A | Discharge status: Home / Self Care | Payer: BLUE CROSS/BLUE SHIELD | Attending: Emergency Medicine | Admitting: Emergency Medicine

## 2015-10-15 ENCOUNTER — Other Ambulatory Visit: Payer: Self-pay | Admitting: Cardiology

## 2015-10-15 ENCOUNTER — Encounter (HOSPITAL_COMMUNITY): Payer: Self-pay | Admitting: *Deleted

## 2015-10-15 DIAGNOSIS — I10 Essential (primary) hypertension: Secondary | ICD-10-CM | POA: Diagnosis not present

## 2015-10-15 DIAGNOSIS — E785 Hyperlipidemia, unspecified: Secondary | ICD-10-CM | POA: Diagnosis present

## 2015-10-15 DIAGNOSIS — Z87891 Personal history of nicotine dependence: Secondary | ICD-10-CM | POA: Insufficient documentation

## 2015-10-15 DIAGNOSIS — R079 Chest pain, unspecified: Secondary | ICD-10-CM | POA: Diagnosis not present

## 2015-10-15 DIAGNOSIS — I252 Old myocardial infarction: Secondary | ICD-10-CM | POA: Diagnosis not present

## 2015-10-15 DIAGNOSIS — R072 Precordial pain: Secondary | ICD-10-CM | POA: Diagnosis not present

## 2015-10-15 DIAGNOSIS — I200 Unstable angina: Secondary | ICD-10-CM

## 2015-10-15 DIAGNOSIS — Z955 Presence of coronary angioplasty implant and graft: Secondary | ICD-10-CM | POA: Insufficient documentation

## 2015-10-15 DIAGNOSIS — Z7982 Long term (current) use of aspirin: Secondary | ICD-10-CM | POA: Diagnosis not present

## 2015-10-15 DIAGNOSIS — Z7901 Long term (current) use of anticoagulants: Secondary | ICD-10-CM | POA: Insufficient documentation

## 2015-10-15 DIAGNOSIS — K219 Gastro-esophageal reflux disease without esophagitis: Secondary | ICD-10-CM | POA: Diagnosis present

## 2015-10-15 DIAGNOSIS — I2510 Atherosclerotic heart disease of native coronary artery without angina pectoris: Secondary | ICD-10-CM | POA: Insufficient documentation

## 2015-10-15 DIAGNOSIS — E782 Mixed hyperlipidemia: Secondary | ICD-10-CM

## 2015-10-15 LAB — CBC
HCT: 42.5 % (ref 39.0–52.0)
Hemoglobin: 14.4 g/dL (ref 13.0–17.0)
MCH: 34 pg (ref 26.0–34.0)
MCHC: 33.9 g/dL (ref 30.0–36.0)
MCV: 100.2 fL — ABNORMAL HIGH (ref 78.0–100.0)
Platelets: 148 10*3/uL — ABNORMAL LOW (ref 150–400)
RBC: 4.24 MIL/uL (ref 4.22–5.81)
RDW: 12.1 % (ref 11.5–15.5)
WBC: 5.3 10*3/uL (ref 4.0–10.5)

## 2015-10-15 LAB — I-STAT TROPONIN, ED
Troponin i, poc: 0 ng/mL (ref 0.00–0.08)
Troponin i, poc: 0 ng/mL (ref 0.00–0.08)

## 2015-10-15 LAB — BASIC METABOLIC PANEL
Anion gap: 12 (ref 5–15)
BUN: 14 mg/dL (ref 6–20)
CO2: 20 mmol/L — ABNORMAL LOW (ref 22–32)
Calcium: 9.2 mg/dL (ref 8.9–10.3)
Chloride: 106 mmol/L (ref 101–111)
Creatinine, Ser: 0.99 mg/dL (ref 0.61–1.24)
GFR calc Af Amer: >60 mL/min (ref >60)
GFR calc non Af Amer: >60 mL/min (ref >60)
Glucose, Bld: 119 mg/dL — ABNORMAL HIGH (ref 65–99)
Potassium: 3.6 mmol/L (ref 3.5–5.1)
Sodium: 138 mmol/L (ref 135–145)

## 2015-10-15 MED ORDER — ISOSORBIDE MONONITRATE ER 60 MG PO TB24: 60.0000 mg | ORAL_TABLET | Freq: Every day | ORAL | 6 refills | Status: DC

## 2015-10-15 MED ORDER — HEPARIN BOLUS VIA INFUSION
4000.0000 [IU] | Freq: Once | INTRAVENOUS | Status: AC
  Administered 2015-10-15: 4000 [IU] via INTRAVENOUS
  Filled 2015-10-15: qty 4000

## 2015-10-15 MED ORDER — ASPIRIN 81 MG PO CHEW
324.0000 mg | CHEWABLE_TABLET | Freq: Once | ORAL | Status: AC
  Administered 2015-10-15: 324 mg via ORAL
  Filled 2015-10-15: qty 4

## 2015-10-15 MED ORDER — HEPARIN (PORCINE) IN NACL 100-0.45 UNIT/ML-% IJ SOLN
1000.0000 [IU]/h | INTRAMUSCULAR | Status: DC
  Administered 2015-10-15: 1000 [IU]/h via INTRAVENOUS
  Filled 2015-10-15: qty 250

## 2015-10-15 MED ORDER — NITROGLYCERIN 0.4 MG SL SUBL
0.4000 mg | SUBLINGUAL_TABLET | SUBLINGUAL | Status: DC | PRN
  Administered 2015-10-15 (×2): 0.4 mg via SUBLINGUAL
  Filled 2015-10-15: qty 1

## 2015-10-15 MED ORDER — ACETAMINOPHEN 325 MG PO TABS
650.0000 mg | ORAL_TABLET | Freq: Once | ORAL | Status: AC
  Administered 2015-10-15: 650 mg via ORAL
  Filled 2015-10-15: qty 2

## 2015-10-15 NOTE — ED Notes (Signed)
Pt states chest pain free but c/o head ache.

## 2015-10-15 NOTE — ED Notes (Signed)
MD at bedside. 

## 2015-10-15 NOTE — ED Triage Notes (Signed)
Pt reports onset this am of chest discomfort. Denies sob. Has cardiac hx. ekg done at triage.

## 2015-10-15 NOTE — Discharge Instructions (Signed)
Follow-up in the cardiology clinic as instructed by Dr. Meda Coffee. They should be giving you a call to set up those appointments.

## 2015-10-15 NOTE — H&P (Signed)
Vincent Flores is an 65 y.o. male.    Primary Cardiologist:Dr. Claiborne Billings PCP: Jenny Reichmann, MD  Chief Complaint: Chest pain  HPI: 35 yom with hx of NSTEMI in 10/2014 with cath with total occlusion of the proximal to mid LAD with both right to left and left to left collaterals.  He underwent successful PCI with stenting of his LAD  With a synergy 3.023 mm stent postdilated to 3.25 mm with the100% occlusion be reduced to 0% and return of TIMI-3 flow.  He had concomitant CAD with 45, mid, and 60% distal RCA stenoses, and 60 and 50% marginal stenoses.  He has HTN.  echo Doppler study on 02/07/2015.  This showed mild LVH with normalization of LV function with an ejection fraction of 60-65%.  There was grade 1 diastolic dysfunction. Pt presents to ER today with chest pain.   Pain described as burning across chest with associated mild SOB no nausea or diaphoresis woke him up at 0200.  It was very similar to MI pain only mildly less.  He took NTG without help and came to ER.  Here he has had IV heparin, ASA 324 and 2 NTG and is currently pain free.  On initial EKG inf leads mildly upward- now more baseline.  Troponin is neg wit POC . PCXR + atherosclerosis of aorta, no edema or consolidation. Other labs WNL.   He has been taking his meds appropriately.          Past Medical History:  Diagnosis Date  . CAD (coronary artery disease)    a. NSTEMI 10/16: mid LAD 100% >> PCI with Synergy DES  . HTN (hypertension)   . Hyperlipidemia   . Ischemic cardiomyopathy    a. Echo 10/16: Moderate LVH, EF 35-40%, anteroseptal akinesis, grade 1 diastolic;  b. Echo 1/70: mild LVH, EF 60-65%, no RWMA, Gr 1 DD.    Past Surgical History:  Procedure Laterality Date  . APPENDECTOMY  1967  . CARDIAC CATHETERIZATION N/A 10/29/2014   Procedure: Left Heart Cath and Coronary Angiography;  Surgeon: Belva Crome, MD;  Location: [facility];  Service: Cardiovascular;  Laterality: N/A;  . CARDIAC CATHETERIZATION N/A  10/29/2014   Procedure: Coronary Stent Intervention;  Surgeon: Belva Crome, MD;  Location: [facility];  Service: Cardiovascular;  Laterality: N/A;  . CORONARY STENT PLACEMENT    . TRANSTHORACIC ECHOCARDIOGRAM  [id number]   normal global wall motion.  normal systolic global function.  calculated EF 61 %    Family History  Problem Relation Age of Onset  . Heart disease Mother   . Cancer Father     Lung, was a smoker   Social History:  reports that he quit smoking about 6 years ago. He has never used smokeless tobacco. He reports that he drinks about 10.5 oz of alcohol per week . He reports that he does not use drugs.  Allergies: No Known Allergies  OUTPATIENT MEDICATIONS: No current facility-administered medications on file prior to encounter.    Current Outpatient Prescriptions on File Prior to Encounter  Medication Sig Dispense Refill  . aspirin EC 81 MG tablet Take 81 mg by mouth daily.    Marland Kitchen atorvastatin (LIPITOR) 80 MG tablet Take 1 tablet (80 mg total) by mouth daily at 6 PM. 30 tablet 11  . carvedilol (COREG) 6.25 MG tablet Take 1 tablet (6.25 mg total) by mouth 2 (two) times daily with a meal. 60 tablet 11  . isosorbide mononitrate (  IMDUR) 30 MG 24 hr tablet Take 1 tablet (30 mg total) by mouth daily. 30 tablet 11  . lansoprazole (PREVACID) 30 MG capsule Take 30 mg by mouth daily.     Marland Kitchen lisinopril (PRINIVIL,ZESTRIL) 10 MG tablet Take 0.5 tablets (5 mg total) by mouth daily. 30 tablet 11  . nitroGLYCERIN (NITROSTAT) 0.4 MG SL tablet Place 1 tablet (0.4 mg total) under the tongue every 5 (five) minutes x 3 doses as needed for chest pain. 25 tablet 3  . pantoprazole (PROTONIX) 40 MG tablet Take 1 tablet (40 mg total) by mouth daily. 30 tablet 11  . ticagrelor (BRILINTA) 90 MG TABS tablet Take 1 tablet (90 mg total) by mouth 2 (two) times daily. 180 tablet 3   PT IS NOT ON PREVACID    Results for orders placed or performed during the hospital encounter of 10/15/15 (from the  past 48 hour(s))  Basic metabolic panel     Status: Abnormal   Collection Time: 10/15/15  7:30 AM  Result Value Ref Range   Sodium 138 135 - 145 mmol/L   Potassium 3.6 3.5 - 5.1 mmol/L   Chloride 106 101 - 111 mmol/L   CO2 20 (L) 22 - 32 mmol/L   Glucose, Bld 119 (H) 65 - 99 mg/dL   BUN 14 6 - 20 mg/dL   Creatinine, Ser 0.99 0.61 - 1.24 mg/dL   Calcium 9.2 8.9 - 10.3 mg/dL   GFR calc non Af Amer >60 >60 mL/min   GFR calc Af Amer >60 >60 mL/min    Comment: (NOTE) The eGFR has been calculated using the CKD EPI equation. This calculation has not been validated in all clinical situations. eGFR's persistently <60 mL/min signify possible Chronic Kidney Disease.    Anion gap 12 5 - 15  CBC     Status: Abnormal   Collection Time: 10/15/15  7:30 AM  Result Value Ref Range   WBC 5.3 4.0 - 10.5 K/uL   RBC 4.24 4.22 - 5.81 MIL/uL   Hemoglobin 14.4 13.0 - 17.0 g/dL   HCT 42.5 39.0 - 52.0 %   MCV 100.2 (H) 78.0 - 100.0 fL   MCH 34.0 26.0 - 34.0 pg   MCHC 33.9 30.0 - 36.0 g/dL   RDW 12.1 11.5 - 15.5 %   Platelets 148 (L) 150 - 400 K/uL  I-stat troponin, ED     Status: None   Collection Time: 10/15/15  7:43 AM  Result Value Ref Range   Troponin i, poc 0.00 0.00 - 0.08 ng/mL   Comment 3            Comment: Due to the release kinetics of cTnI, a negative result within the first hours of the onset of symptoms does not rule out myocardial infarction with certainty. If myocardial infarction is still suspected, repeat the test at appropriate intervals.   I-stat troponin, ED (0, 3, 6)  not at [facility]     Status: None   Collection Time: 10/15/15 10:55 AM  Result Value Ref Range   Troponin i, poc 0.00 0.00 - 0.08 ng/mL   Comment 3            Comment: Due to the release kinetics of cTnI, a negative result within the first hours of the onset of symptoms does not rule out myocardial infarction with certainty. If myocardial infarction is still suspected, repeat the test at appropriate  intervals.    Dg Chest Portable 1 View  Result Date: 10/15/2015  CLINICAL DATA:  Chest pain EXAM: PORTABLE CHEST 1 VIEW COMPARISON:  November 04, 2014 FINDINGS: There is no edema or consolidation. The heart size and pulmonary vascularity are normal. No adenopathy. There is atherosclerotic calcification in the aorta. No pneumothorax. No bone lesions. IMPRESSION: Aortic atherosclerosis.  No edema or consolidation. Electronically Signed   By: Lowella Grip III M.D.   On: 10/15/2015 08:17    ROS: General:no colds or fevers, no weight changes Skin:no rashes or ulcers HEENT:no blurred vision, no congestion CV:see HPI PUL:see HPI GI:no diarrhea constipation or melena, no indigestion GU:no hematuria, no dysuria MS:no joint pain, no claudication Neuro:no syncope, no lightheadedness Endo:no diabetes, no thyroid disease   Blood pressure 117/73, pulse 67, temperature 98 F (36.7 C), temperature source Oral, resp. rate 18, height 5' 6"  (1.676 m), weight 190 lb 14.7 oz (86.6 kg), SpO2 98 %. PE: General:Pleasant affect, NAD Skin:Warm and dry, brisk capillary refill HEENT:normocephalic, sclera clear, mucus membranes moist Neck:supple, no JVD, no bruits  Heart:S1S2 RRR without murmur, gallup, rub or click Lungs:clear without rales, rhonchi, or wheezes OHY:WVPX, non tender, + BS, do not palpate liver spleen or masses Ext:no lower ext edema, 2+ pedal pulses, 2+ radial pulses Neuro:alert and oriented, MAE, follows commands, + facial symmetry    Assessment/Plan Principal Problem:   Unstable angina (HCC)- troponin POC negative, pain relief with SL NTG, ASA and Heparin.  Admit and do serial troponins, if + would need cath - if neg nuc study possible Dr. Meda Coffee to  See.  Will increase Imdur in meantime since he is pain free.  Active Problems:   CAD in native artery, with NSTEMI 10/2014, stent to LAD, residual disease in other vessels- on statin, BB   Essential hypertension- controlled, continue home  meds   Hyperlipidemia- continue statin   GERD (gastroesophageal reflux disease)- continue protonix   Hx of myocardial infarction, 10/2014- wit stent , continue Brilinta.    Cecilie Kicks Nurse Practitioner Certified [place] Pager [phone] or after 5pm or weekends call [phone] 10/15/2015, 12:00 PM  The patient was seen, examined and discussed with Cecilie Kicks, NP and I agree with the above.   This is a very pleasant 72 gentleman with prior medical history non-STEMI in October 2016 stent to LAD, with residual disease 60% in distal RCA, 45% in mid RCA, 60% in the first OM. The patient had originally mildly depressed LVEF 50% that has improved to 60-65% on follow-up echo in January 2017. He has been asymptomatic, compliant with his medications. He woke up this morning with retrosternal burning similar incorrect her to the pain when he had non-STEMI a year ago however milder in intensity. He presented to the ER, nitroglycerin resolved his pain. He is currently asymptomatic. Physical exam doesn't shows no JVD elevation, no murmurs, clear lungs and no lower extremity edema. His EKG is unchanged from prior. His troponin is negative 2. We will increase his Imdur to 60 mg daily as his blood pressure is borderline. We will arrange for an outpatient stress test early next week and outpatient follow-up later next week. Patient agrees with the plan as he wishes to go home rather than staying for an inpatient stress test.  Ena Dawley 10/15/2015

## 2015-10-15 NOTE — ED Notes (Signed)
MD at bedside. Card PA

## 2015-10-15 NOTE — ED Notes (Signed)
Pt states he understands instructions and will follow up as directed.Home stable with wife.

## 2015-10-15 NOTE — ED Provider Notes (Addendum)
[facility] DEPT Provider Note   CSN: [id number] Arrival date & time: 10/15/15  0702     History   Chief Complaint Chief Complaint  Patient presents with  . Chest Pain    HPI Vincent Flores is a 65 y.o. male.  The history is provided by the patient.  Chest Pain   This is a recurrent problem. Episode onset: 2 hours ago, woke him up. The problem has not changed since onset.Associated with: he was sleeping. The pain is present in the substernal region. The pain is moderate. The quality of the pain is described as burning. The pain does not radiate. Pertinent negatives include no diaphoresis, no fever, no nausea, no shortness of breath and no vomiting. Numbness: fingers tingling. He has tried nitroglycerin for the symptoms. The treatment provided no relief.  Pt has a history NSTEMI 1 year ago.  He had an occluded mid lad.  He has had some sx intermittently since his MI but nothing like today.  The pain now is very similar to his prior MI.  Past Medical History:  Diagnosis Date  . CAD (coronary artery disease)    a. NSTEMI 10/16: mid LAD 100% >> PCI with Synergy DES  . HTN (hypertension)   . Hyperlipidemia   . Ischemic cardiomyopathy    a. Echo 10/16: Moderate LVH, EF 35-40%, anteroseptal akinesis, grade 1 diastolic;  b. Echo 0000000: mild LVH, EF 60-65%, no RWMA, Gr 1 DD.    Patient Active Problem List   Diagnosis Date Noted  . Hemorrhoid 05/18/2015  . GERD (gastroesophageal reflux disease) 01/15/2015  . Essential hypertension 11/01/2014  . Hyperlipidemia 11/01/2014  . NSTEMI (non-ST elevated myocardial infarction) ([place]) 10/29/2014  . Abnormal EKG 02/14/2012    Past Surgical History:  Procedure Laterality Date  . APPENDECTOMY  1967  . CARDIAC CATHETERIZATION N/A 10/29/2014   Procedure: Left Heart Cath and Coronary Angiography;  Surgeon: Belva Crome, MD;  Location: [facility];  Service: Cardiovascular;  Laterality: N/A;  . CARDIAC CATHETERIZATION N/A 10/29/2014   Procedure: Coronary Stent Intervention;  Surgeon: Belva Crome, MD;  Location: [facility];  Service: Cardiovascular;  Laterality: N/A;  . CORONARY STENT PLACEMENT    . TRANSTHORACIC ECHOCARDIOGRAM  [id number]   normal global wall motion.  normal systolic global function.  calculated EF 61 %       Home Medications    Prior to Admission medications   Medication Sig Start Date End Date Taking? Authorizing Provider  aspirin EC 81 MG tablet Take 81 mg by mouth daily.   Yes Historical Provider, MD  atorvastatin (LIPITOR) 80 MG tablet Take 1 tablet (80 mg total) by mouth daily at 6 PM. 11/01/14  Yes Almyra Deforest, PA  carvedilol (COREG) 6.25 MG tablet Take 1 tablet (6.25 mg total) by mouth 2 (two) times daily with a meal. 11/01/14  Yes Almyra Deforest, PA  diphenhydramine-acetaminophen (TYLENOL PM) 25-500 MG TABS tablet Take 2 tablets by mouth at bedtime as needed (pain / sleep).   Yes Historical Provider, MD  isosorbide mononitrate (IMDUR) 30 MG 24 hr tablet Take 1 tablet (30 mg total) by mouth daily. 11/01/14  Yes Almyra Deforest, PA  lansoprazole (PREVACID) 30 MG capsule Take 30 mg by mouth daily.  10/26/14  Yes Historical Provider, MD  lisinopril (PRINIVIL,ZESTRIL) 10 MG tablet Take 0.5 tablets (5 mg total) by mouth daily. 08/01/15  Yes Troy Sine, MD  nitroGLYCERIN (NITROSTAT) 0.4 MG SL tablet Place 1 tablet (0.4 mg total)  under the tongue every 5 (five) minutes x 3 doses as needed for chest pain. 11/01/14  Yes Almyra Deforest, PA  pantoprazole (PROTONIX) 40 MG tablet Take 1 tablet (40 mg total) by mouth daily. 11/01/14  Yes Almyra Deforest, PA  ticagrelor (BRILINTA) 90 MG TABS tablet Take 1 tablet (90 mg total) by mouth 2 (two) times daily. 11/01/14  Yes Almyra Deforest, PA    Family History Family History  Problem Relation Age of Onset  . Heart disease Mother   . Cancer Father     Lung, was a smoker    Social History Social History  Substance Use Topics  . Smoking status: Former Smoker    Quit date: 01/08/2009    . Smokeless tobacco: Never Used  . Alcohol use 10.5 oz/week    21 Standard drinks or equivalent per week     Allergies   Review of patient's allergies indicates no known allergies.   Review of Systems Review of Systems  Constitutional: Negative for diaphoresis and fever.  Respiratory: Negative for shortness of breath.   Cardiovascular: Positive for chest pain.  Gastrointestinal: Negative for nausea and vomiting.  Neurological: Numbness: fingers tingling.     Physical Exam Updated Vital Signs BP 132/72   Pulse 73   Temp 98 F (36.7 C) (Oral)   Resp 19   Ht 5\' 6"  (1.676 m)   Wt 86.6 kg   SpO2 97%   BMI 30.81 kg/m   Physical Exam  Constitutional: He appears well-developed and well-nourished. No distress.  HENT:  Head: Normocephalic and atraumatic.  Right Ear: External ear normal.  Left Ear: External ear normal.  Eyes: Conjunctivae are normal. Right eye exhibits no discharge. Left eye exhibits no discharge. No scleral icterus.  Neck: Neck supple. No tracheal deviation present.  Cardiovascular: Normal rate, regular rhythm and intact distal pulses.   Pulmonary/Chest: Effort normal and breath sounds normal. No stridor. No respiratory distress. He has no wheezes. He has no rales.  Abdominal: Soft. Bowel sounds are normal. He exhibits no distension. There is no tenderness. There is no rebound and no guarding.  Musculoskeletal: He exhibits no edema or tenderness.  Neurological: He is alert. He has normal strength. No cranial nerve deficit (no facial droop, extraocular movements intact, no slurred speech) or sensory deficit. He exhibits normal muscle tone. He displays no seizure activity. Coordination normal.  Skin: Skin is warm and dry. No rash noted.  Psychiatric: He has a normal mood and affect.  Nursing note and vitals reviewed.    ED Treatments / Results  Labs (all labs ordered are listed, but only abnormal results are displayed) Labs Reviewed  BASIC METABOLIC  PANEL - Abnormal; Notable for the following:       Result Value   CO2 20 (*)    Glucose, Bld 119 (*)    All other components within normal limits  CBC - Abnormal; Notable for the following:    MCV 100.2 (*)    Platelets 148 (*)    All other components within normal limits  I-STAT TROPOININ, ED  I-STAT TROPOININ, ED  I-STAT TROPOININ, ED    EKG  EKG Interpretation  Date/Time:  Saturday October 15 2015 07:09:09 EDT Ventricular Rate:  73 PR Interval:  128 QRS Duration: 98 QT Interval:  384 QTC Calculation: 423 R Axis:   68 Text Interpretation:  Normal sinus rhythm Normal ECG No significant change since last tracing Confirmed by   MD-J,  ([id number]) on 10/15/2015 7:13:09 AM Also confirmed  by [facility]  MD-J,  [phone]), editor Rolla Plate, Joelene Millin [phone])  on 10/15/2015 10:12:09 AM       Radiology Dg Chest Portable 1 View  Result Date: 10/15/2015 CLINICAL DATA:  Chest pain EXAM: PORTABLE CHEST 1 VIEW COMPARISON:  November 04, 2014 FINDINGS: There is no edema or consolidation. The heart size and pulmonary vascularity are normal. No adenopathy. There is atherosclerotic calcification in the aorta. No pneumothorax. No bone lesions. IMPRESSION: Aortic atherosclerosis.  No edema or consolidation. Electronically Signed   By: Lowella Grip III M.D.   On: 10/15/2015 08:17    Procedures Procedures (including critical care time)  Medications Ordered in ED Medications  nitroGLYCERIN (NITROSTAT) SL tablet 0.4 mg (0.4 mg Sublingual Given 10/15/15 0820)  heparin ADULT infusion 100 units/mL (25000 units/270mL sodium chloride 0.45%) (1,000 Units/hr Intravenous New Bag/Given 10/15/15 0841)  aspirin chewable tablet 324 mg (324 mg Oral Given 10/15/15 0810)  heparin bolus via infusion 4,000 Units (4,000 Units Intravenous Bolus from Bag 10/15/15 0841)  acetaminophen (TYLENOL) tablet 650 mg (650 mg Oral Given 10/15/15 0925)     Initial Impression / Assessment and Plan / ED Course  I have reviewed the  triage vital signs and the nursing notes.  Pertinent labs & imaging results that were available during my care of the patient were reviewed by me and considered in my medical decision making (see chart for details).  Clinical Course  Comment By Time  No acute changes noted on EKG.  Similar to prior EKG.  Sx are concerning for recurrent angina.  Will check labs, treat with heparin, asa and nitro.  Continue to monitor closely Dorie Rank, MD 10/07 0740  Initial troponin negative.  Will repeat ECG Dorie Rank, MD 10/07 [phone]  Pt states his sx are resolved now after the nitro.  Currently on a heparin infusion.  Will consult with cardiology.    Dorie Rank, MD 10/07 [phone]  Discussed with Dr Meda Coffee, cardiology.  Cardiology will see him in the ED Dorie Rank, MD 10/07 [phone]   Patient has a known history of coronary disease with a non-ST elevation MI associated with an LAD occlusion. Patient did have collateral blood flow on his prior cath contributing to the N STEMI versus an ST elevation MI.  Patient is presenting with similar symptoms to his MI although the quality of the sx is burning. His first troponin is normal. EKG is similar to previous EKGs on file. Patient has responded to nitroglycerin and heparin. I will consult with cardiology considering his history and recurrent symptoms.  Final Clinical Impressions(s) / ED Diagnoses   Final diagnoses:  Chest pain, unspecified type    New Prescriptions New Prescriptions   No medications on file     Dorie Rank, MD 10/15/15 1014  Patient was seen by Dr. Meda Coffee in the emergency room. Had 2 sets of cardiac enzymes. Those were both negative. His symptoms have resolved and he is not having any EKG changes. She feels that he can be discharged and follow up closely in the office. The patient is feeling much better and is ready to go home.   Dorie Rank, MD 10/15/15 [phone]

## 2015-10-15 NOTE — Progress Notes (Signed)
ANTICOAGULATION CONSULT NOTE - Initial Consult  Pharmacy Consult for heparin Indication: chest pain/ACS  No Known Allergies  Patient Measurements: Height: 5\' 6"  (167.6 cm) Weight: 190 lb 14.7 oz (86.6 kg) IBW/kg (Calculated) : 63.8 Heparin Dosing Weight: 85kg  Vital Signs: Temp: 98 F (36.7 C) (10/07 0710) Temp Source: Oral (10/07 0710) BP: 120/78 (10/07 0710) Pulse Rate: 73 (10/07 0710)   Medical History: Past Medical History:  Diagnosis Date  . CAD (coronary artery disease)    a. NSTEMI 10/16: mid LAD 100% >> PCI with Synergy DES  . HTN (hypertension)   . Hyperlipidemia   . Ischemic cardiomyopathy    a. Echo 10/16: Moderate LVH, EF 35-40%, anteroseptal akinesis, grade 1 diastolic;  b. Echo 0000000: mild LVH, EF 60-65%, no RWMA, Gr 1 DD.    Assessment: 65yo male c/o chest discomfort, has h/o NSTEMI 36yr ago, to begin heparin.  Goal of Therapy:  Heparin level 0.3-0.7 units/ml Monitor platelets by anticoagulation protocol: Yes   Plan:  Will give heparin 4000 units IV bolus x1 followed by gtt at 1000 units/hr and monitor heparin levels and CBC.  Wynona Neat, PharmD, BCPS  10/15/2015,7:44 AM

## 2015-10-17 ENCOUNTER — Telehealth: Payer: Self-pay | Admitting: Cardiovascular Disease

## 2015-10-17 ENCOUNTER — Encounter: Payer: Self-pay | Admitting: Cardiovascular Disease

## 2015-10-17 ENCOUNTER — Telehealth: Payer: Self-pay | Admitting: *Deleted

## 2015-10-17 NOTE — Telephone Encounter (Signed)
New message      Pt was seen in the ER Saturday.  He was seen because of chest pain.  Pt states that  The doctor that saw him told him to call this am and schedule a stress test for today.  Pt has eaten breakfast.  Can he be scheduled for a stress test today?  Please call

## 2015-10-17 NOTE — Telephone Encounter (Signed)
OPEN ERROR

## 2015-10-17 NOTE — Telephone Encounter (Signed)
Follow up   Pt wife wants pt test done sooner and she wants rn to call back

## 2015-10-17 NOTE — Telephone Encounter (Signed)
Returned call to patient. Advised him order for stress test is in system and that someone from our office will call to schedule. Informed him of general instructions for stress test. Informed him dependent on type of study, he may need to hold medications for date of test/24 hrs prior, but that these instructions will be relayed by person once test scheduled. Informed him I would relay request to schedule stress test to staff. Pt voiced understanding & thanks.

## 2015-10-17 NOTE — Telephone Encounter (Signed)
Returned call, spoke to patient, he is aware that someone from nuclear will contact him regarding any special instructions. Pt voiced thanks.

## 2015-10-18 ENCOUNTER — Telehealth (HOSPITAL_COMMUNITY): Payer: Self-pay | Admitting: *Deleted

## 2015-10-18 NOTE — Telephone Encounter (Signed)
Patient given detailed instructions per Myocardial Perfusion Study Information Sheet for the test on 10/21/15 at 7:15. Patient notified to arrive 15 minutes early and that it is imperative to arrive on time for appointment to keep from having the test rescheduled.  If you need to cancel or reschedule your appointment, please call the office within 24 hours of your appointment. Failure to do so may result in a cancellation of your appointment, and a $50 no show fee. Patient verbalized understanding.Vincent Flores

## 2015-10-21 ENCOUNTER — Ambulatory Visit (HOSPITAL_COMMUNITY): Payer: BLUE CROSS/BLUE SHIELD | Attending: Cardiology

## 2015-10-21 DIAGNOSIS — R0602 Shortness of breath: Secondary | ICD-10-CM | POA: Insufficient documentation

## 2015-10-21 DIAGNOSIS — R079 Chest pain, unspecified: Secondary | ICD-10-CM | POA: Insufficient documentation

## 2015-10-21 DIAGNOSIS — I200 Unstable angina: Secondary | ICD-10-CM

## 2015-10-21 LAB — MYOCARDIAL PERFUSION IMAGING
LV dias vol: 97 mL (ref 62–150)
LV sys vol: 45 mL
Peak HR: 91 {beats}/min
RATE: 0.3
Rest HR: 51 {beats}/min
SDS: 0
SRS: 3
SSS: 3
TID: 1.19

## 2015-10-21 MED ORDER — TECHNETIUM TC 99M TETROFOSMIN IV KIT
10.2000 | PACK | Freq: Once | INTRAVENOUS | Status: AC | PRN
  Administered 2015-10-21: 10.2 via INTRAVENOUS
  Filled 2015-10-21: qty 11

## 2015-10-21 MED ORDER — TECHNETIUM TC 99M TETROFOSMIN IV KIT
32.4000 | PACK | Freq: Once | INTRAVENOUS | Status: AC | PRN
  Administered 2015-10-21: 32.4 via INTRAVENOUS
  Filled 2015-10-21: qty 33

## 2015-10-21 MED ORDER — REGADENOSON 0.4 MG/5ML IV SOLN
0.4000 mg | Freq: Once | INTRAVENOUS | Status: AC
  Administered 2015-10-21: 0.4 mg via INTRAVENOUS

## 2015-10-25 ENCOUNTER — Telehealth: Payer: Self-pay | Admitting: Cardiovascular Disease

## 2015-10-25 NOTE — Telephone Encounter (Signed)
Pt would like stress test results from Friday please.

## 2015-10-28 ENCOUNTER — Other Ambulatory Visit: Payer: Self-pay | Admitting: Cardiovascular Disease

## 2015-10-28 DIAGNOSIS — E785 Hyperlipidemia, unspecified: Secondary | ICD-10-CM

## 2015-10-28 NOTE — Telephone Encounter (Signed)
Spoke with Vincent Flores, preliminary report given to patient. Aware dr Claiborne Billings is in the office Monday and will try to get him the final results.

## 2015-10-28 NOTE — Telephone Encounter (Signed)
New Message  Pt wife call requesting to speak with RN about results. Please call back to discuss

## 2015-10-30 NOTE — Telephone Encounter (Signed)
Low risk with minimal area of apical ischemia; NL EF; No STT changes; med Rx

## 2015-10-31 NOTE — Telephone Encounter (Signed)
NEw MEssage  Pt wife call stating pt refills for medication have not been sent to the pharmacy. Please call back to discuss

## 2015-10-31 NOTE — Telephone Encounter (Signed)
Returned call to patient Vincent Flores results given.Advised to keep appointment with [facility] 11/25/15 at 2:15 pm.

## 2015-10-31 NOTE — Telephone Encounter (Signed)
F/u message  PT wife call asking for a call back to the pt for results. Please call back to discuss

## 2015-11-17 LAB — COMPREHENSIVE METABOLIC PANEL
ALT: 30 U/L (ref 9–46)
AST: 22 U/L (ref 10–35)
Albumin: 4.6 g/dL (ref 3.6–5.1)
Alkaline Phosphatase: 62 U/L (ref 40–115)
BUN: 19 mg/dL (ref 7–25)
CO2: 25 mmol/L (ref 20–31)
Calcium: 9.3 mg/dL (ref 8.6–10.3)
Chloride: 102 mmol/L (ref 98–110)
Creat: 1.2 mg/dL (ref 0.70–1.25)
Glucose, Bld: 115 mg/dL — ABNORMAL HIGH (ref 65–99)
Potassium: 4.2 mmol/L (ref 3.5–5.3)
Sodium: 137 mmol/L (ref 135–146)
Total Bilirubin: 0.9 mg/dL (ref 0.2–1.2)
Total Protein: 7 g/dL (ref 6.1–8.1)

## 2015-11-17 LAB — CBC
HCT: 44.1 % (ref 38.5–50.0)
Hemoglobin: 15.2 g/dL (ref 13.2–17.1)
MCH: 34.2 pg — ABNORMAL HIGH (ref 27.0–33.0)
MCHC: 34.5 g/dL (ref 32.0–36.0)
MCV: 99.1 fL (ref 80.0–100.0)
MPV: 11.1 fL (ref 7.5–12.5)
Platelets: 176 10*3/uL (ref 140–400)
RBC: 4.45 MIL/uL (ref 4.20–5.80)
RDW: 12.6 % (ref 11.0–15.0)
WBC: 7.3 10*3/uL (ref 3.8–10.8)

## 2015-11-17 LAB — TSH: TSH: 1.91 mIU/L (ref 0.40–4.50)

## 2015-11-17 LAB — LIPID PANEL
Cholesterol: 140 mg/dL (ref <200)
HDL: 47 mg/dL (ref >40)
LDL Cholesterol: 74 mg/dL
Total CHOL/HDL Ratio: 3 Ratio (ref <5.0)
Triglycerides: 96 mg/dL (ref <150)
VLDL: 19 mg/dL (ref <30)

## 2015-11-25 ENCOUNTER — Encounter: Payer: Self-pay | Admitting: Cardiovascular Disease

## 2015-11-25 ENCOUNTER — Ambulatory Visit (INDEPENDENT_AMBULATORY_CARE_PROVIDER_SITE_OTHER): Payer: BLUE CROSS/BLUE SHIELD | Admitting: Cardiovascular Disease

## 2015-11-25 VITALS — BP 104/69 | HR 68 | Ht 66.0 in | Wt 194.0 lb

## 2015-11-25 DIAGNOSIS — I10 Essential (primary) hypertension: Secondary | ICD-10-CM | POA: Diagnosis not present

## 2015-11-25 DIAGNOSIS — I2510 Atherosclerotic heart disease of native coronary artery without angina pectoris: Secondary | ICD-10-CM | POA: Diagnosis not present

## 2015-11-25 DIAGNOSIS — R0683 Snoring: Secondary | ICD-10-CM

## 2015-11-25 DIAGNOSIS — K219 Gastro-esophageal reflux disease without esophagitis: Secondary | ICD-10-CM

## 2015-11-25 DIAGNOSIS — I214 Non-ST elevation (NSTEMI) myocardial infarction: Secondary | ICD-10-CM | POA: Diagnosis not present

## 2015-11-25 DIAGNOSIS — E782 Mixed hyperlipidemia: Secondary | ICD-10-CM | POA: Diagnosis not present

## 2015-11-25 DIAGNOSIS — G478 Other sleep disorders: Secondary | ICD-10-CM

## 2015-11-25 MED ORDER — TICAGRELOR 60 MG PO TABS: 60.0000 mg | ORAL_TABLET | Freq: Two times a day (BID) | ORAL | 11 refills | Status: DC

## 2015-11-25 NOTE — Patient Instructions (Addendum)
Your physician has recommended that you have a sleep study. This test records several body functions during sleep, including: brain activity, eye movement, oxygen and carbon dioxide blood levels, heart rate and rhythm, breathing rate and rhythm, the flow of air through your mouth and nose, snoring, body muscle movements, and chest and belly movement. This will be done at the [facility].   Your physician recommends that you schedule a follow-up appointment in: March 2018 in office sleep clinic.  Your physician has recommended you make the following change in your medication:   1.) the Brilinta has been changed to 60 mg twice a day once you have completed the 90 mg prescription.

## 2015-11-26 NOTE — Progress Notes (Signed)
Patient ID: Vincent Flores, male   DOB: Jul 16, 1950, 65 y.o.   MRN: [id number]     Primary M.D.: Dr. Arlyss Queen  HPI: Vincent Flores is a 65 y.o. male who presents to the office today for a 4 month follow up cardiology evaluation.  Vincent Flores has  a history of hyperlipidemia and hypertension. On the morning of 10/29/2014, he was awakened at 3 AM with chest tightness radiating to his back and associated with mild diaphoresis and dyspnea.  He presented to the emergency room.  A chest CT was negative for PE and aortic dissection. A chest x-ray suggested early T-wave peaking in less than 0.5 mm J-point elevation in lead 3.  Troponin was positive for non-ST segment elevation MI at 2.17.  He was admitted and underwent cardiac catheterization which was done by Dr. Tamala Julian.  This revealed total occlusion of the proximal to mid LAD with both right to left and left to left collaterals.  He underwent successful PCI with stenting of his LAD with a Synergy 3.023 mm stent postdilated to 3.25 mm with the100% occlusion be reduced to 0% and return of TIMI-3 flow.  He had concomitant CAD with 40 - 50% mid, and 60% distal RCA stenoses, and 60 and 50% marginal stenoses.  Since hospital discharge, he has felt well.  He denies recurrent chest tightness or pressure.   He underwent an echo Doppler study on 02/07/2015.  This showed mild LVH with normalization of LV function with an ejection fraction of 60-65%.  There was grade 1 diastolic dysfunction.  Since I last saw him, he continues to do well.  He admits to easy bruisability on aspirin and brilinta.  He is unaware of palpitations.  Since I last saw him he had been doing very well. He developed a burning chest pain which awakened him from sleep on 10/717.  He presented to the emergency room.  Troponins were negative.  His ECG was unchanged.  Imdur was increased to 60 mg daily.  He was referred for a nuclear stress test which was done on 10/31/2015.  This was low risk and only  showed a minimal region of apical abnormality.  Ejection fraction was 54%.  Subsequently, he has not had any recurrent episodes of chest pain since his emergency room evaluation.  On further questioning, the patient does snore loudly.  He has been noted to have witnessed apnea.  He typically wakes up 5 times per night.  He has nocturia at least twice per night.  He presents for evaluation.   Past Medical History:  Diagnosis Date  . CAD (coronary artery disease)    a. NSTEMI 10/16: mid LAD 100% >> PCI with Synergy DES  . HTN (hypertension)   . Hyperlipidemia   . Ischemic cardiomyopathy    a. Echo 10/16: Moderate LVH, EF 35-40%, anteroseptal akinesis, grade 1 diastolic;  b. Echo 0/10: mild LVH, EF 60-65%, no RWMA, Gr 1 DD.    Past Surgical History:  Procedure Laterality Date  . APPENDECTOMY  1967  . CARDIAC CATHETERIZATION N/A 10/29/2014   Procedure: Left Heart Cath and Coronary Angiography;  Surgeon: Belva Crome, MD;  Location: [facility];  Service: Cardiovascular;  Laterality: N/A;  . CARDIAC CATHETERIZATION N/A 10/29/2014   Procedure: Coronary Stent Intervention;  Surgeon: Belva Crome, MD;  Location: [facility];  Service: Cardiovascular;  Laterality: N/A;  . CORONARY STENT PLACEMENT    . TRANSTHORACIC ECHOCARDIOGRAM  [id number]   normal global wall motion.  normal systolic global function.  calculated EF 61 %    No Known Allergies  Current Outpatient Prescriptions  Medication Sig Dispense Refill  . aspirin EC 81 MG tablet Take 81 mg by mouth daily.    Marland Kitchen atorvastatin (LIPITOR) 80 MG tablet TAKE 1 TABLET (80 MG TOTAL) BY MOUTH DAILY AT 6 PM. 30 tablet 2  . carvedilol (COREG) 6.25 MG tablet TAKE 1 TABLET (6.25 MG TOTAL) BY MOUTH 2 (TWO) TIMES DAILY WITH A MEAL. 60 tablet 2  . diphenhydramine-acetaminophen (TYLENOL PM) 25-500 MG TABS tablet Take 2 tablets by mouth at bedtime as needed (pain / sleep).    . isosorbide mononitrate (IMDUR) 60 MG 24 hr tablet Take 1 tablet  (60 mg total) by mouth daily. 30 tablet 6  . lisinopril (PRINIVIL,ZESTRIL) 10 MG tablet Take 0.5 tablets (5 mg total) by mouth daily. 30 tablet 11  . nitroGLYCERIN (NITROSTAT) 0.4 MG SL tablet Place 1 tablet (0.4 mg total) under the tongue every 5 (five) minutes x 3 doses as needed for chest pain. 25 tablet 3  . pantoprazole (PROTONIX) 40 MG tablet TAKE 1 TABLET (40 MG TOTAL) BY MOUTH DAILY. 30 tablet 2  . ticagrelor (BRILINTA) 60 MG TABS tablet Take 1 tablet (60 mg total) by mouth 2 (two) times daily. 60 tablet 11   No current facility-administered medications for this visit.     Social History   Social History  . Marital status: Married    Spouse name: N/A  . Number of children: N/A  . Years of education: N/A   Occupational History  . Not on file.   Social History Main Topics  . Smoking status: Former Smoker    Quit date: 01/08/2009  . Smokeless tobacco: Never Used  . Alcohol use 10.5 oz/week    21 Standard drinks or equivalent per week  . Drug use: No  . Sexual activity: Yes    Partners: Female   Other Topics Concern  . Not on file   Social History Narrative  . No narrative on file    Family History  Problem Relation Age of Onset  . Heart disease Mother   . Cancer Father     Lung, was a smoker    ROS General: Negative; No fevers, chills, or night sweats HEENT: Negative; No changes in vision or hearing, sinus congestion, difficulty swallowing Pulmonary: Negative; No cough, wheezing, shortness of breath, hemoptysis Cardiovascular: See HPI: No chest pain, presyncope, syncope, palpatations GI: Negative; No nausea, vomiting, diarrhea, or abdominal pain GU: Negative; No dysuria, hematuria, or difficulty voiding Musculoskeletal: Negative; no myalgias, joint pain, or weakness Hematologic: Negative; no easy bruising, bleeding Endocrine: Negative; no heat/cold intolerance; no diabetes, Neuro: Negative; no changes in balance, headaches Skin: Negative; No rashes or skin  lesions Psychiatric: Negative; No behavioral problems, depression Sleep: Positive for poor sleep, snoring,  daytime sleepiness,  Other comprehensive 14 point system review is negative   Physical Exam BP 104/69 (BP Location: Right Arm, Patient Position: Sitting, Cuff Size: Normal)   Pulse 68   Ht 5' 6"  (1.676 m)   Wt 194 lb (88 kg)   SpO2 97%   BMI 31.31 kg/m    Blood pressure by me 112/70 supine and 110/70 standing.   Wt Readings from Last 3 Encounters:  11/25/15 194 lb (88 kg)  10/15/15 190 lb 14.7 oz (86.6 kg)  08/01/15 191 lb (86.6 kg)   General: Alert, oriented, no distress.  Skin: normal turgor, no rashes, warm and dry HEENT:  Normocephalic, atraumatic. Pupils equal round and reactive to light; sclera anicteric; extraocular muscles intact, No lid lag; Nose without nasal septal hypertrophy; Mouth/Parynx benign; Mallinpatti scale 3 Neck: No JVD, no carotid bruits; normal carotid upstroke Lungs: clear to ausculatation and percussion bilaterally; no wheezing or rales, normal inspiratory and expiratory effort Chest wall: without tenderness to palpitation Heart: PMI not displaced, RRR, s1 s2 normal, 1/6 systolic murmur, No diastolic murmur, no rubs, gallops, thrills, or heaves Abdomen: soft, nontender; no hepatosplenomehaly, BS+; abdominal aorta nontender and not dilated by palpation. Back: no CVA tenderness Pulses: 2+  Musculoskeletal: full range of motion, normal strength, no joint deformities Extremities: Pulses 2+, no clubbing cyanosis or edema, Homan's sign negative  Neurologic: grossly nonfocal; Cranial nerves grossly wnl Psychologic: Normal mood and affect  ECG (independently read by me): Normal sinus rhythm at 68 bpm.  No ECG evidence for prior MI.  Minimal nondiagnostic Q wave in lead 3.  July 2017 ECG (independently read by me): Normal sinus rhythm at 76 bpm.  Q wave in lead 3.  No ectopy.  January 2017 ECG (independently read by me):  Normal sinus rhythm at 63 bpm.   Q wave present in lead 3. No significant ST-T changes.  LABS:  BMP Latest Ref Rng & Units 11/17/2015 10/15/2015 11/04/2014  Glucose 65 - 99 mg/dL 115(H) 119(H) 114(H)  BUN 7 - 25 mg/dL 19 14 24(H)  Creatinine 0.70 - 1.25 mg/dL 1.20 0.99 1.15  BUN/Creat Ratio 10 - 22 - - -  Sodium 135 - 146 mmol/L 137 138 134(L)  Potassium 3.5 - 5.3 mmol/L 4.2 3.6 4.3  Chloride 98 - 110 mmol/L 102 106 102  CO2 20 - 31 mmol/L 25 20(L) 23  Calcium 8.6 - 10.3 mg/dL 9.3 9.2 9.7     Hepatic Function Latest Ref Rng & Units 11/17/2015 01/07/2015 11/01/2014  Total Protein 6.1 - 8.1 g/dL 7.0 7.2 6.4(L)  Albumin 3.6 - 5.1 g/dL 4.6 4.5 3.5  AST 10 - 35 U/L 22 25 32  ALT 9 - 46 U/L 30 34 30  Alk Phosphatase 40 - 115 U/L 62 77 67  Total Bilirubin 0.2 - 1.2 mg/dL 0.9 0.8 1.3(H)  Bilirubin, Direct <=0.2 mg/dL - 0.2 0.2    CBC Latest Ref Rng & Units 11/17/2015 10/15/2015 11/04/2014  WBC 3.8 - 10.8 K/uL 7.3 5.3 8.9  Hemoglobin 13.2 - 17.1 g/dL 15.2 14.4 15.8  Hematocrit 38.5 - 50.0 % 44.1 42.5 44.7  Platelets 140 - 400 K/uL 176 148(L) 215   Lab Results  Component Value Date   MCV 99.1 11/17/2015   MCV 100.2 (H) 10/15/2015   MCV 98.9 11/04/2014    Lab Results  Component Value Date   TSH 1.91 11/17/2015    BNP    Component Value Date/Time   BNP 68.7 11/04/2014 0744    ProBNP No results found for: PROBNP   Lipid Panel     Component Value Date/Time   CHOL 140 11/17/2015 0804   CHOL 206 (H) 12/04/2013 1013   TRIG 96 11/17/2015 0804   HDL 47 11/17/2015 0804   HDL 46 12/04/2013 1013   CHOLHDL 3.0 11/17/2015 0804   VLDL 19 11/17/2015 0804   LDLCALC 74 11/17/2015 0804   LDLCALC 137 (H) 12/04/2013 1013     RADIOLOGY: No results found.    ASSESSMENT AND PLAN:  Vincent Flores is a 65 year old white male who suffered a non-ST segment elevation MI which awakened him from sleep on 10/29/2014 was found to  have total LAD occlusion, but fortunately had collaterals to his LAD from both right to  left and left to left sources which ultimately resulted in a non-STEMI rather than ST segment elevation MI.  His LV function is entirely normal on echo Doppler study in January 2017.  When I last saw him, his blood pressure was low and when he stood abruptly he experience mild dizziness.  I reduced his lisinopril to 5 mg.  Recently had another experience of chest burning in the middle of the night.  Enzymes were negative.  His most recent nuclear study was reviewed.  This showed only a minimal apical defect and was felt to be low risk.  I'll long discussion with him.  I am concerned that he had been awakened from sleep with chest pain raising the possibility of obstructive sleep apnea.  Upon further questioning today, he does snore loudly, his sleep is nonrestorative, he has frequent awakenings as well, as experiences nocturia.  I am highly suspect that he has sleep apnea, which may be contributing to nocturnal oxygen desaturation and precipitating potential myocardial ischemia. For this reason, I am scheduling him for a sleep study.  His body mass index is compatible with mild obesity and weight loss was recommended.  I also have discussed the importance of exercising at least 5 days per week for 30 minutes at a time.  Since he is one year past his acute artery syndrome, I discussed with him at length the Pegasys trial.  I will reduce Brilinta to 60 mg twice a day.  He also has a history of GERD for which he takes pantoprazole.  I also discussed with him that CPAP therapy will improve GERD symptoms.  I will see him in 3-4 months for cardiology reevaluation.    Time spent: 25 minutes  Troy Sine, MD, [facility]  11/26/2015 10:50 AM

## 2015-12-28 ENCOUNTER — Telehealth: Payer: Self-pay | Admitting: *Deleted

## 2015-12-28 NOTE — Telephone Encounter (Signed)
Order for in home sleep study sent to [place]. patient's insurance denied in lab study. 01/03/16 sleep study at [facility] was cancelled. Patient notified.

## 2016-01-03 ENCOUNTER — Encounter (HOSPITAL_BASED_OUTPATIENT_CLINIC_OR_DEPARTMENT_OTHER): Payer: BLUE CROSS/BLUE SHIELD

## 2016-01-25 ENCOUNTER — Other Ambulatory Visit: Payer: Self-pay | Admitting: Cardiovascular Disease

## 2016-01-25 DIAGNOSIS — E785 Hyperlipidemia, unspecified: Secondary | ICD-10-CM

## 2016-01-27 NOTE — Telephone Encounter (Signed)
Rx has been sent to the pharmacy electronically. ° °

## 2016-02-09 ENCOUNTER — Telehealth: Payer: Self-pay | Admitting: *Deleted

## 2016-02-09 DIAGNOSIS — G4733 Obstructive sleep apnea (adult) (pediatric): Secondary | ICD-10-CM

## 2016-02-09 NOTE — Telephone Encounter (Signed)
Spoke with pt, aware of sleep study results. Order placed and titration study scheduled for 03-26-16. He will report to [place] at 8 pm.

## 2016-02-09 NOTE — Telephone Encounter (Signed)
-----   Message from Troy Sine, MD sent at 02/05/2016 10:02 PM EST ----- Home sleep study with severe OSA  REI 38.4/hr;;  Recommend CPAP titration study with me to read

## 2016-02-10 ENCOUNTER — Telehealth: Payer: Self-pay | Admitting: Cardiovascular Disease

## 2016-02-10 NOTE — Telephone Encounter (Signed)
New Message  Pts wife voiced wanting to speak with [facility] nurse to get a sooner appt.  Please f/u

## 2016-02-10 NOTE — Telephone Encounter (Signed)
Spoke to patient, this was in regards to an appt for CPAP titration, not office visit. He wanted CPAP titration study moved up sooner. Advised to contact [facility] so that they can move if availability or add him to wait list. He voiced he will keep appt as is and call them for waitlist addition. Aware to call us if he has any other needs.

## 2016-02-24 ENCOUNTER — Telehealth: Payer: Self-pay | Admitting: *Deleted

## 2016-02-24 NOTE — Telephone Encounter (Signed)
Clearance to hold Brilinta for tooth extraction sent to Dr Romie Minus, DDS.

## 2016-03-20 ENCOUNTER — Telehealth: Payer: Self-pay | Admitting: Cardiovascular Disease

## 2016-03-20 ENCOUNTER — Telehealth: Payer: Self-pay | Admitting: *Deleted

## 2016-03-20 NOTE — Telephone Encounter (Signed)
Left message to return a call to discuss his sleep titration denial.

## 2016-03-20 NOTE — Telephone Encounter (Signed)
New Message     Pt calling for his sleep study results

## 2016-03-20 NOTE — Telephone Encounter (Signed)
Patient called and notified insurance denied CPAP titration study. 2 week auto titration ordered through [facility].

## 2016-03-20 NOTE — Telephone Encounter (Signed)
Will forward to [employer]

## 2016-03-20 NOTE — Telephone Encounter (Signed)
Faxed  Records and order for  2 week CPAP auto titration to [facility].

## 2016-03-24 ENCOUNTER — Other Ambulatory Visit: Payer: Self-pay | Admitting: Cardiovascular Disease

## 2016-03-24 MED ORDER — NITROGLYCERIN 0.4 MG SL SUBL: 0.4000 mg | SUBLINGUAL_TABLET | SUBLINGUAL | 3 refills | Status: DC | PRN

## 2016-03-26 ENCOUNTER — Encounter (HOSPITAL_BASED_OUTPATIENT_CLINIC_OR_DEPARTMENT_OTHER): Payer: BLUE CROSS/BLUE SHIELD

## 2016-03-29 ENCOUNTER — Ambulatory Visit: Payer: 59 | Admitting: Cardiovascular Disease

## 2016-05-11 ENCOUNTER — Other Ambulatory Visit: Payer: Self-pay | Admitting: Cardiology

## 2016-05-11 NOTE — Telephone Encounter (Signed)
REFILL 

## 2016-05-29 ENCOUNTER — Encounter: Payer: Self-pay | Admitting: Cardiovascular Disease

## 2016-05-29 ENCOUNTER — Ambulatory Visit (INDEPENDENT_AMBULATORY_CARE_PROVIDER_SITE_OTHER): Payer: 59 | Admitting: Cardiovascular Disease

## 2016-05-29 VITALS — BP 102/60 | HR 80 | Ht 66.0 in | Wt 205.6 lb

## 2016-05-29 DIAGNOSIS — E782 Mixed hyperlipidemia: Secondary | ICD-10-CM | POA: Diagnosis not present

## 2016-05-29 DIAGNOSIS — G4733 Obstructive sleep apnea (adult) (pediatric): Secondary | ICD-10-CM | POA: Diagnosis not present

## 2016-05-29 DIAGNOSIS — I214 Non-ST elevation (NSTEMI) myocardial infarction: Secondary | ICD-10-CM | POA: Diagnosis not present

## 2016-05-29 DIAGNOSIS — I10 Essential (primary) hypertension: Secondary | ICD-10-CM | POA: Diagnosis not present

## 2016-05-29 DIAGNOSIS — K219 Gastro-esophageal reflux disease without esophagitis: Secondary | ICD-10-CM | POA: Diagnosis not present

## 2016-05-29 NOTE — Patient Instructions (Signed)
Your physician wants you to follow-up in: 6 months or sooner if needed. You will receive a reminder letter in the mail two months in advance. If you don't receive a letter, please call our office to schedule the follow-up appointment.   If you need a refill on your cardiac medications before your next appointment, please call your pharmacy. 

## 2016-05-29 NOTE — Progress Notes (Signed)
Patient ID: Janzen Sacks, male   DOB: 14-Oct-1950, 66 y.o.   MRN: [id number]     Primary M.D.: Dr. Arlyss Queen  HPI: Yuvaan Olander is a 66 y.o. male who presents to the office today for a 6 month follow up cardiology evaluation and sleep clinic evaluation.  Mr. Swayze has  a history of hyperlipidemia and hypertension. On the morning of 10/29/2014, he was awakened at 3 AM with chest tightness radiating to his back and associated with mild diaphoresis and dyspnea.  He presented to the emergency room.  A chest CT was negative for PE and aortic dissection. A chest x-ray suggested early T-wave peaking in less than 0.5 mm J-point elevation in lead 3.  Troponin was positive for non-ST segment elevation MI at 2.17.  He was admitted and underwent cardiac catheterization which was done by Dr. Tamala Julian.  This revealed total occlusion of the proximal to mid LAD with both right to left and left to left collaterals.  He underwent successful PCI with stenting of his LAD with a Synergy 3.023 mm stent postdilated to 3.25 mm with the100% occlusion be reduced to 0% and return of TIMI-3 flow.  He had concomitant CAD with 40 - 50% mid, and 60% distal RCA stenoses, and 60 and 50% marginal stenoses.  Since hospital discharge, he has felt well.  He denies recurrent chest tightness or pressure.   An echo Doppler study on 02/07/2015 demonstrated mild LVH with normalization of LV function with an ejection fraction of 60-65%.  There was grade 1 diastolic dysfunction.  Since I last saw him, he continues to do well.  He admits to easy bruisability on aspirin and brilinta.  He is unaware of palpitations.   He developed a burning chest pain which awakened him from sleep on 10/717.  He presented to the emergency room.  Troponins were negative.  His ECG was unchanged.  Imdur was increased to 60 mg daily.  He was referred for a nuclear stress test which was done on 10/31/2015.  This was low risk and only showed a minimal region of apical  abnormality.  Ejection fraction was 54%.  Subsequently, he has not had any recurrent episodes of chest pain since his emergency room evaluation.  When I last saw him, he admitted to very loud snoring. He has been noted to have witnessed apnea.  He typically wakes up 5 times per night.  He has nocturia at least twice per night.  I was highly suspect of sleep apnea which may be contributing to nocturnal oxygen desaturation and potentially precipitating myocardial ischemia.  I scheduled him for a sleep study.  Apparently insurance company denied an in lab study.  He underwent a home study which revealed moderate sleep apnea with an AHI of 38.4.  Percentage of snoring was 7.4%.  Oxygen desaturation was 89%.  He has been on a ResMed air is sensed 10 AutoSet CPAP unit since 03/26/2016.  He is set at a minimum pressure of 4 and maximum pressure of 20.  AHI is 8.7 of which an apnea index is 8.3 with hypopnea index of 0.4.  He did not have a significant leak except one night.  He has been using a ResMed Swift fracture nasal mask.  [employer] [facility] [employer] is his DME company.  He has noticed significant benefit since initiating CPAP therapy.  An Epworth Sleepiness Scale score was calculated in the office today and this endorsed at 3 and did not reveal excessive daytime sleepiness.  He presents  for evaluation  Past Medical History:  Diagnosis Date  . CAD (coronary artery disease)    a. NSTEMI 10/16: mid LAD 100% >> PCI with Synergy DES  . HTN (hypertension)   . Hyperlipidemia   . Ischemic cardiomyopathy    a. Echo 10/16: Moderate LVH, EF 35-40%, anteroseptal akinesis, grade 1 diastolic;  b. Echo 8/28: mild LVH, EF 60-65%, no RWMA, Gr 1 DD.    Past Surgical History:  Procedure Laterality Date  . APPENDECTOMY  1967  . CARDIAC CATHETERIZATION N/A 10/29/2014   Procedure: Left Heart Cath and Coronary Angiography;  Surgeon: Belva Crome, MD;  Location: [facility];  Service: Cardiovascular;  Laterality: N/A;  .  CARDIAC CATHETERIZATION N/A 10/29/2014   Procedure: Coronary Stent Intervention;  Surgeon: Belva Crome, MD;  Location: [facility];  Service: Cardiovascular;  Laterality: N/A;  . CORONARY STENT PLACEMENT    . TRANSTHORACIC ECHOCARDIOGRAM  003491   normal global wall motion.  normal systolic global function.  calculated EF 61 %    No Known Allergies  Current Outpatient Prescriptions  Medication Sig Dispense Refill  . aspirin EC 81 MG tablet Take 81 mg by mouth daily.    Marland Kitchen atorvastatin (LIPITOR) 80 MG tablet TAKE 1 TABLET (80 MG TOTAL) BY MOUTH DAILY AT 6 PM. 30 tablet 9  . carvedilol (COREG) 6.25 MG tablet TAKE 1 TABLET (6.25 MG TOTAL) BY MOUTH 2 (TWO) TIMES DAILY WITH A MEAL. 60 tablet 9  . diphenhydramine-acetaminophen (TYLENOL PM) 25-500 MG TABS tablet Take 2 tablets by mouth at bedtime as needed (pain / sleep).    . isosorbide mononitrate (IMDUR) 60 MG 24 hr tablet TAKE 1 TABLET BY MOUTH EVERY DAY 30 tablet 1  . lisinopril (PRINIVIL,ZESTRIL) 10 MG tablet Take 0.5 tablets (5 mg total) by mouth daily. 30 tablet 11  . nitroGLYCERIN (NITROSTAT) 0.4 MG SL tablet Place 1 tablet (0.4 mg total) under the tongue every 5 (five) minutes x 3 doses as needed for chest pain. 25 tablet 3  . pantoprazole (PROTONIX) 40 MG tablet TAKE 1 TABLET (40 MG TOTAL) BY MOUTH DAILY. 30 tablet 9  . ticagrelor (BRILINTA) 60 MG TABS tablet Take 1 tablet (60 mg total) by mouth 2 (two) times daily. 60 tablet 11   No current facility-administered medications for this visit.     Social History   Social History  . Marital status: Married    Spouse name: N/A  . Number of children: N/A  . Years of education: N/A   Occupational History  . Not on file.   Social History Main Topics  . Smoking status: Former Smoker    Quit date: 01/08/2009  . Smokeless tobacco: Never Used  . Alcohol use 10.5 oz/week    21 Standard drinks or equivalent per week  . Drug use: No  . Sexual activity: Yes    Partners: Female    Other Topics Concern  . Not on file   Social History Narrative  . No narrative on file    Family History  Problem Relation Age of Onset  . Heart disease Mother   . Cancer Father        Lung, was a smoker    ROS General: Negative; No fevers, chills, or night sweats HEENT: Negative; No changes in vision or hearing, sinus congestion, difficulty swallowing Pulmonary: Negative; No cough, wheezing, shortness of breath, hemoptysis Cardiovascular: See HPI: No chest pain, presyncope, syncope, palpatations GI: Negative; No nausea, vomiting, diarrhea, or  abdominal pain GU: Negative; No dysuria, hematuria, or difficulty voiding Musculoskeletal: Negative; no myalgias, joint pain, or weakness Hematologic: Negative; no easy bruising, bleeding Endocrine: Negative; no heat/cold intolerance; no diabetes, Neuro: Negative; no changes in balance, headaches Skin: Negative; No rashes or skin lesions Psychiatric: Negative; No behavioral problems, depression Sleep: Positive for poor sleep, snoring,  daytime sleepiness,  Other comprehensive 14 point system review is negative   Physical Exam BP 102/60   Pulse 80   Ht 5' 6" (1.676 m)   Wt 205 lb 9.6 oz (93.3 kg)   BMI 33.18 kg/m    Blood pressure by me 110/62.  Wt Readings from Last 3 Encounters:  05/29/16 205 lb 9.6 oz (93.3 kg)  11/25/15 194 lb (88 kg)  10/15/15 190 lb 14.7 oz (86.6 kg)   General: Alert, oriented, no distress.  Skin: normal turgor, no rashes, warm and dry HEENT: Normocephalic, atraumatic. Pupils equal round and reactive to light; sclera anicteric; extraocular muscles intact, No lid lag; Nose without nasal septal hypertrophy; Mouth/Parynx benign; Mallinpatti scale 3 Neck: No JVD, no carotid bruits; normal carotid upstroke Lungs: clear to ausculatation and percussion bilaterally; no wheezing or rales, normal inspiratory and expiratory effort Chest wall: without tenderness to palpitation Heart: PMI not displaced, RRR, s1  s2 normal, 1/6 systolic murmur, No diastolic murmur, no rubs, gallops, thrills, or heaves Abdomen: soft, nontender; no hepatosplenomehaly, BS+; abdominal aorta nontender and not dilated by palpation. Back: no CVA tenderness Pulses: 2+  Musculoskeletal: full range of motion, normal strength, no joint deformities Extremities: Pulses 2+, no clubbing cyanosis or edema, Homan's sign negative  Neurologic: grossly nonfocal; Cranial nerves grossly wnl Psychologic: Normal mood and affect  November 2017 ECG (independently read by me): Normal sinus rhythm at 68 bpm.  No ECG evidence for prior MI.  Minimal nondiagnostic Q wave in lead 3.  July 2017 ECG (independently read by me): Normal sinus rhythm at 76 bpm.  Q wave in lead 3.  No ectopy.  January 2017 ECG (independently read by me):  Normal sinus rhythm at 63 bpm.  Q wave present in lead 3. No significant ST-T changes.  LABS:  BMP Latest Ref Rng & Units 11/17/2015 10/15/2015 11/04/2014  Glucose 65 - 99 mg/dL 115(H) 119(H) 114(H)  BUN 7 - 25 mg/dL 19 14 24(H)  Creatinine 0.70 - 1.25 mg/dL 1.20 0.99 1.15  BUN/Creat Ratio 10 - 22 - - -  Sodium 135 - 146 mmol/L 137 138 134(L)  Potassium 3.5 - 5.3 mmol/L 4.2 3.6 4.3  Chloride 98 - 110 mmol/L 102 106 102  CO2 20 - 31 mmol/L 25 20(L) 23  Calcium 8.6 - 10.3 mg/dL 9.3 9.2 9.7     Hepatic Function Latest Ref Rng & Units 11/17/2015 01/07/2015 11/01/2014  Total Protein 6.1 - 8.1 g/dL 7.0 7.2 6.4(L)  Albumin 3.6 - 5.1 g/dL 4.6 4.5 3.5  AST 10 - 35 U/L 22 25 32  ALT 9 - 46 U/L 30 34 30  Alk Phosphatase 40 - 115 U/L 62 77 67  Total Bilirubin 0.2 - 1.2 mg/dL 0.9 0.8 1.3(H)  Bilirubin, Direct <=0.2 mg/dL - 0.2 0.2    CBC Latest Ref Rng & Units 11/17/2015 10/15/2015 11/04/2014  WBC 3.8 - 10.8 K/uL 7.3 5.3 8.9  Hemoglobin 13.2 - 17.1 g/dL 15.2 14.4 15.8  Hematocrit 38.5 - 50.0 % 44.1 42.5 44.7  Platelets 140 - 400 K/uL 176 148(L) 215   Lab Results  Component Value Date   MCV 99.1 11/17/2015  MCV  100.2 (H) 10/15/2015   MCV 98.9 11/04/2014    Lab Results  Component Value Date   TSH 1.91 11/17/2015    BNP    Component Value Date/Time   BNP 68.7 11/04/2014 0744    ProBNP No results found for: PROBNP   Lipid Panel     Component Value Date/Time   CHOL 140 11/17/2015 0804   CHOL 206 (H) 12/04/2013 1013   TRIG 96 11/17/2015 0804   HDL 47 11/17/2015 0804   HDL 46 12/04/2013 1013   CHOLHDL 3.0 11/17/2015 0804   VLDL 19 11/17/2015 0804   LDLCALC 74 11/17/2015 0804   LDLCALC 137 (H) 12/04/2013 1013     RADIOLOGY: No results found.  IMPRESSION:  1. OSA (obstructive sleep apnea)   2. Essential hypertension   3. NSTEMI (non-ST elevated myocardial infarction) ([place])   4. Mixed hyperlipidemia   5. Gastroesophageal reflux disease without esophagitis     ASSESSMENT AND PLAN:  Mr. Supreme Rybarczyk is a 66 year old white male who suffered a non-ST segment elevation MI which awakened him from sleep on 10/29/2014 was found to have total LAD occlusion, but fortunately had collaterals to his LAD from both right to left and left to left sources which ultimately resulted in a non-STEMI rather than ST segment elevation MI.  His LV function is entirely normal on echo Doppler study in January 2017.  On prior evaluation his blood pressure was low and when he stood abruptly he experience mild dizziness.  I reduced his lisinopril to 5 mg. His most recent nuclear study  showed only a minimal apical defect and was felt to be low risk.when I last saw him, per [employer] trial data, I reduced his Brilinta to low dose.  He has been tolerating this well.  He continues to be on carvedilol 6.25 twice a day, lisinopril 5 mg and isosorbide 60 mg.  His blood pressure today is stable and he has not had orthostatic symptomatology.  Lipid studies in November revealed improvement in LDL cholesterol at 74 on atorvastatin 80 mg.  GERD is controlled.  I reviewed his sleep study with him in detail.  His insurance  carrier did not allow him to have an in lab study.  He was found to have severe sleep apnea.  He's now been on CPAP therapy and is compliant with 100% usage.  He is averaging 8 hours and 19 minutes of use per night.  His AHI is still mildly increased at 8.7, of which 8.3.  Represent an apnea index.  His 95th percentile pressure is 11.3 with a maximum pressure of 13.8.  I am adjusting his CPAP unit and will increase his start pressure at 8 cm rather than for which should make an impact particularly since he most likely is having apneic events in  the early portion of the night when his CPAP pressure was low.  We will obtain a new download in one month to reassess the change.  He will continue current therapy.  I will see him in 6 months for cardiology evaluation or sooner if problems arise.  Time spent: 25 minutes  Troy Sine, MD, [facility]  05/31/2016 11:24 PM

## 2016-05-31 NOTE — Addendum Note (Signed)
Addended by: Shelva Majestic A on: 05/31/2016 11:25 PM   Modules accepted: Level of Service

## 2016-06-25 ENCOUNTER — Telehealth: Payer: Self-pay | Admitting: Cardiovascular Disease

## 2016-06-25 MED ORDER — ISOSORBIDE MONONITRATE ER 60 MG PO TB24: 60.0000 mg | ORAL_TABLET | Freq: Every day | ORAL | 5 refills | Status: DC

## 2016-06-25 NOTE — Telephone Encounter (Signed)
New message     *STAT* If patient is at the pharmacy, call can be transferred to refill team.   1. Which medications need to be refilled? (please list name of each medication and dose if known) isosorbide 60 mg  2. Which pharmacy/location (including street and city if local pharmacy) is medication to be sent to? [facility] [place]   3. Do they need a 30 day or 90 day supply? 30 day

## 2016-06-25 NOTE — Telephone Encounter (Signed)
Rx(s) sent to pharmacy electronically.  

## 2016-07-13 ENCOUNTER — Other Ambulatory Visit: Payer: Self-pay | Admitting: Cardiovascular Disease

## 2016-08-29 ENCOUNTER — Telehealth: Payer: Self-pay | Admitting: Cardiovascular Disease

## 2016-08-29 DIAGNOSIS — E7800 Pure hypercholesterolemia, unspecified: Secondary | ICD-10-CM

## 2016-08-29 NOTE — Telephone Encounter (Signed)
°*  STAT* If patient is at the pharmacy, call can be transferred to refill team.   1. Which medications need to be refilled? (please list name of each medication and dose if known) Lisinopril 5 mg  2. Which pharmacy/location (including street and city if local pharmacy) is medication to be sent to? [facility] [address] [facility] [employer] [facility] [place]  3. Do they need a 30 day or 90 day supply? [place]

## 2016-08-30 MED ORDER — LISINOPRIL 10 MG PO TABS: 5.0000 mg | ORAL_TABLET | Freq: Every day | ORAL | 3 refills | Status: DC

## 2016-08-30 NOTE — Telephone Encounter (Signed)
Refill sent to the pharmacy electronically.  

## 2016-11-16 ENCOUNTER — Other Ambulatory Visit: Payer: Self-pay | Admitting: Cardiovascular Disease

## 2016-12-26 ENCOUNTER — Other Ambulatory Visit: Payer: Self-pay | Admitting: Cardiovascular Disease

## 2017-01-15 ENCOUNTER — Telehealth: Payer: Self-pay | Admitting: Cardiovascular Disease

## 2017-01-15 MED ORDER — ISOSORBIDE MONONITRATE ER 60 MG PO TB24: 60.0000 mg | ORAL_TABLET | Freq: Every day | ORAL | 1 refills | Status: DC

## 2017-01-15 NOTE — Telephone Encounter (Signed)
Returned call to wife (ok per DPR), unsure why refill was denied as 3 month supply was sent 12/2016, advised rx will be sent to pharmacy.   Also advised patient due to OV.  Advised scheduler will reach out to get appt scheduled.

## 2017-01-15 NOTE — Telephone Encounter (Signed)
Patient wife calling,  Pt c/o medication issue:  1. Name of Medication: Isosorbide mononitrate  2. How are you currently taking this medication (dosage and times per day)? 60 mg 1x daily   3. Are you having a reaction (difficulty breathing--STAT)? no  4. What is your medication issue? States that patient was denied refill and would like to verify patient needs to continue medication.

## 2017-01-16 ENCOUNTER — Telehealth: Payer: Self-pay | Admitting: Cardiovascular Disease

## 2017-01-16 NOTE — Telephone Encounter (Signed)
Called patient and LVM to call back to schedule followup with Dr. Kelly. °

## 2017-01-18 ENCOUNTER — Ambulatory Visit: Payer: 59 | Admitting: Cardiovascular Disease

## 2017-01-21 ENCOUNTER — Encounter: Payer: Self-pay | Admitting: Cardiovascular Disease

## 2017-01-21 ENCOUNTER — Ambulatory Visit: Payer: 59 | Admitting: Cardiovascular Disease

## 2017-01-21 VITALS — BP 110/69 | HR 60 | Ht 66.0 in | Wt 203.2 lb

## 2017-01-21 DIAGNOSIS — E782 Mixed hyperlipidemia: Secondary | ICD-10-CM

## 2017-01-21 DIAGNOSIS — I10 Essential (primary) hypertension: Secondary | ICD-10-CM

## 2017-01-21 DIAGNOSIS — K219 Gastro-esophageal reflux disease without esophagitis: Secondary | ICD-10-CM | POA: Diagnosis not present

## 2017-01-21 DIAGNOSIS — I2510 Atherosclerotic heart disease of native coronary artery without angina pectoris: Secondary | ICD-10-CM

## 2017-01-21 DIAGNOSIS — Z79899 Other long term (current) drug therapy: Secondary | ICD-10-CM

## 2017-01-21 DIAGNOSIS — G4733 Obstructive sleep apnea (adult) (pediatric): Secondary | ICD-10-CM | POA: Diagnosis not present

## 2017-01-21 NOTE — Patient Instructions (Signed)
Medication Instructions:  Your physician recommends that you continue on your current medications as directed. Please refer to the Current Medication list given to you today.  Labwork: Please return for FASTING labs (CMET, CBC, Lipid, TSH)  Follow-Up: Your physician wants you to follow-up in: 12 months with Dr. Claiborne Billings.  You will receive a reminder letter in the mail two months in advance. If you don't receive a letter, please call our office to schedule the follow-up appointment.   Any Other Special Instructions Will Be Listed Below (If Applicable).  We have increased your CPAP pressure remotely to 10-20 cm H20   If you need a refill on your cardiac medications before your next appointment, please call your pharmacy.
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
## 2017-01-28 ENCOUNTER — Encounter: Payer: Self-pay | Admitting: Cardiovascular Disease

## 2017-02-06 LAB — LIPID PANEL
Chol/HDL Ratio: 2.4 ratio (ref 0.0–5.0)
Cholesterol, Total: 132 mg/dL (ref 100–199)
HDL: 56 mg/dL (ref >39)
LDL Calculated: 56 mg/dL (ref 0–99)
Triglycerides: 98 mg/dL (ref 0–149)
VLDL Cholesterol Cal: 20 mg/dL (ref 5–40)

## 2017-02-06 LAB — COMPREHENSIVE METABOLIC PANEL
ALT: 24 IU/L (ref 0–44)
AST: 21 IU/L (ref 0–40)
Albumin/Globulin Ratio: 2.3 — ABNORMAL HIGH (ref 1.2–2.2)
Albumin: 4.8 g/dL (ref 3.6–4.8)
Alkaline Phosphatase: 85 IU/L (ref 39–117)
BUN/Creatinine Ratio: 16 (ref 10–24)
BUN: 15 mg/dL (ref 8–27)
Bilirubin Total: 0.5 mg/dL (ref 0.0–1.2)
CO2: 21 mmol/L (ref 20–29)
Calcium: 9.8 mg/dL (ref 8.6–10.2)
Chloride: 101 mmol/L (ref 96–106)
Creatinine, Ser: 0.92 mg/dL (ref 0.76–1.27)
GFR calc Af Amer: 101 mL/min/{1.73_m2} (ref >59)
GFR calc non Af Amer: 87 mL/min/{1.73_m2} (ref >59)
Globulin, Total: 2.1 g/dL (ref 1.5–4.5)
Glucose: 114 mg/dL — ABNORMAL HIGH (ref 65–99)
Potassium: 4.7 mmol/L (ref 3.5–5.2)
Sodium: 138 mmol/L (ref 134–144)
Total Protein: 6.9 g/dL (ref 6.0–8.5)

## 2017-02-06 LAB — CBC
Hematocrit: 41.6 % (ref 37.5–51.0)
Hemoglobin: 15.4 g/dL (ref 13.0–17.7)
MCH: 34.9 pg — ABNORMAL HIGH (ref 26.6–33.0)
MCHC: 37 g/dL — ABNORMAL HIGH (ref 31.5–35.7)
MCV: 94 fL (ref 79–97)
Platelets: 162 10*3/uL (ref 150–379)
RBC: 4.41 x10E6/uL (ref 4.14–5.80)
RDW: 12.1 % — ABNORMAL LOW (ref 12.3–15.4)
WBC: 7.6 10*3/uL (ref 3.4–10.8)

## 2017-02-06 LAB — TSH: TSH: 1.98 u[IU]/mL (ref 0.450–4.500)

## 2017-04-22 ENCOUNTER — Telehealth: Payer: Self-pay | Admitting: *Deleted

## 2017-04-22 NOTE — Telephone Encounter (Signed)
Faxed CM[facility] for CPAP supplies to [facility].

## 2017-05-03 ENCOUNTER — Other Ambulatory Visit: Payer: Self-pay | Admitting: Cardiovascular Disease

## 2017-05-03 DIAGNOSIS — E7800 Pure hypercholesterolemia, unspecified: Secondary | ICD-10-CM

## 2017-07-13 ENCOUNTER — Other Ambulatory Visit: Payer: Self-pay | Admitting: Cardiovascular Disease

## 2017-07-15 NOTE — Telephone Encounter (Signed)
Rx sent to pharmacy   

## 2017-07-16 ENCOUNTER — Emergency Department (HOSPITAL_COMMUNITY)
Admission: EM | Admit: 2017-07-16 | Discharge: 2017-07-16 | Disposition: A | Discharge status: Home / Self Care | Payer: 59 | Attending: Emergency Medicine | Admitting: Emergency Medicine

## 2017-07-16 ENCOUNTER — Other Ambulatory Visit: Payer: Self-pay

## 2017-07-16 ENCOUNTER — Emergency Department (HOSPITAL_COMMUNITY): Payer: 59

## 2017-07-16 ENCOUNTER — Other Ambulatory Visit: Payer: Self-pay | Admitting: Cardiology

## 2017-07-16 ENCOUNTER — Encounter (HOSPITAL_COMMUNITY): Payer: Self-pay | Admitting: Emergency Medicine

## 2017-07-16 DIAGNOSIS — Z79899 Other long term (current) drug therapy: Secondary | ICD-10-CM | POA: Insufficient documentation

## 2017-07-16 DIAGNOSIS — Z87891 Personal history of nicotine dependence: Secondary | ICD-10-CM | POA: Insufficient documentation

## 2017-07-16 DIAGNOSIS — Z7982 Long term (current) use of aspirin: Secondary | ICD-10-CM | POA: Diagnosis not present

## 2017-07-16 DIAGNOSIS — I2510 Atherosclerotic heart disease of native coronary artery without angina pectoris: Secondary | ICD-10-CM | POA: Insufficient documentation

## 2017-07-16 DIAGNOSIS — R079 Chest pain, unspecified: Secondary | ICD-10-CM

## 2017-07-16 DIAGNOSIS — I10 Essential (primary) hypertension: Secondary | ICD-10-CM | POA: Insufficient documentation

## 2017-07-16 DIAGNOSIS — I252 Old myocardial infarction: Secondary | ICD-10-CM | POA: Diagnosis not present

## 2017-07-16 LAB — BASIC METABOLIC PANEL
Anion gap: 14 (ref 5–15)
BUN: 16 mg/dL (ref 8–23)
CO2: 22 mmol/L (ref 22–32)
Calcium: 10.5 mg/dL — ABNORMAL HIGH (ref 8.9–10.3)
Chloride: 103 mmol/L (ref 98–111)
Creatinine, Ser: 1.19 mg/dL (ref 0.61–1.24)
GFR calc Af Amer: >60 mL/min (ref >60)
GFR calc non Af Amer: >60 mL/min (ref >60)
Glucose, Bld: 128 mg/dL — ABNORMAL HIGH (ref 70–99)
Potassium: 4.4 mmol/L (ref 3.5–5.1)
Sodium: 139 mmol/L (ref 135–145)

## 2017-07-16 LAB — I-STAT TROPONIN, ED
Troponin i, poc: 0 ng/mL (ref 0.00–0.08)
Troponin i, poc: 0 ng/mL (ref 0.00–0.08)

## 2017-07-16 LAB — CBC
HCT: 48.1 % (ref 39.0–52.0)
Hemoglobin: 16.5 g/dL (ref 13.0–17.0)
MCH: 34.2 pg — ABNORMAL HIGH (ref 26.0–34.0)
MCHC: 34.3 g/dL (ref 30.0–36.0)
MCV: 99.8 fL (ref 78.0–100.0)
Platelets: 210 10*3/uL (ref 150–400)
RBC: 4.82 MIL/uL (ref 4.22–5.81)
RDW: 11.9 % (ref 11.5–15.5)
WBC: 10.3 10*3/uL (ref 4.0–10.5)

## 2017-07-16 NOTE — ED Triage Notes (Addendum)
Was outside working is a [employer] and started to have cp has taken 2 nitro, states did not help , some sob , no n/v/d, pain rads to arm and neck

## 2017-07-16 NOTE — ED Notes (Signed)
Pt to xray with monitor.

## 2017-07-16 NOTE — Discharge Instructions (Addendum)
Return to ER if you have any worsening symptoms including worsening chest pain, breathing problems, vomiting, or feeling like you are going to pass out. The cardiology team will schedule a stress test for you.

## 2017-07-16 NOTE — Consult Note (Addendum)
Consult Note    Patient ID: Vincent Flores MRN: [id number], DOB/AGE: Jan 28, 1950   Admit date: 07/16/2017   Primary Physician: Darlyne Russian, MD Primary Cardiologist: Dr. Allena Katz is a 67 y.o. male who is being seen today for the evaluation of chest pain at the request of Dr. Alvino Chapel.  Patient Profile    67 yo male with PMH of CAD s/p mLAD PCI/DES ('16), HTN, HL, Ischemic cardiomyopathy, and OSA who presented with chest pain.   Past Medical History   Past Medical History:  Diagnosis Date  . CAD (coronary artery disease)    a. NSTEMI 10/16: mid LAD 100% >> PCI with Synergy DES  . HTN (hypertension)   . Hyperlipidemia   . Ischemic cardiomyopathy    a. Echo 10/16: Moderate LVH, EF 35-40%, anteroseptal akinesis, grade 1 diastolic;  b. Echo 0/17: mild LVH, EF 60-65%, no RWMA, Gr 1 DD.    Past Surgical History:  Procedure Laterality Date  . APPENDECTOMY  1967  . CARDIAC CATHETERIZATION N/A 10/29/2014   Procedure: Left Heart Cath and Coronary Angiography;  Surgeon: Belva Crome, MD;  Location: [facility];  Service: Cardiovascular;  Laterality: N/A;  . CARDIAC CATHETERIZATION N/A 10/29/2014   Procedure: Coronary Stent Intervention;  Surgeon: Belva Crome, MD;  Location: [facility];  Service: Cardiovascular;  Laterality: N/A;  . CORONARY STENT PLACEMENT    . TRANSTHORACIC ECHOCARDIOGRAM  [id number]   normal global wall motion.  normal systolic global function.  calculated EF 61 %     Allergies  No Known Allergies  History of Present Illness    Vincent Flores is a 67 year old male with past medical history of CAD s/p mLAD PCI/DES ('16), HTN, HL, Ischemic cardiomyopathy, and OSA.  He is followed by Dr. Claiborne Billings as an outpatient.  He underwent cardiac catheterization back in 10/16 when he presented with chest tightness, diaphoresis and dyspnea.  Cath at that time showed a total occlusion of the proximal to mid LAD with both right to left and left to left  collaterals.  He underwent PCI/DES of his mid LAD with a Synergy stent.  Was noted to have concomitant CAD with 40 to 50% in the mid, and 60% distal RCA stenosis.  Echo at that time showed ischemic cardiomyopathy with an EF of 35%.  Repeat echocardiogram 1/17 showed mild LVH with normalization of his LV function with an EF of 60 to 65%, and grade 1 diastolic dysfunction.  He presented back for recurrent chest pain 10/17 and underwent a stress test which was low risk and only showed minimal region of apical abnormality, with a normal EF.  He is also followed by Dr. Claiborne Billings for obstructive sleep apnea, and is compliant with his CPAP.  He is been compliant with his medications, and his last LDL back on 1/19 was 56.  He currently works as a Curator, and his job is very physical.  He does not experience any exertional chest pain or dyspnea on a regular basis.  States today around 11:00 he was up on a ladder at work out in the heat whenever he experienced some chest burning, and pain between his shoulder blades along with some shortness of breath.  He climbed down the ladder and sat down, took 1 sublingual nitroglycerin without much relief.  Waited about 5 minutes took a second nitroglycerin and symptoms persisted.  At that time he became concerned and a friend brought him to the ER for further evaluation.  States that total time of his symptoms was around 45 minutes to an hour.  By the time he got to the ER his symptoms had improved, he is more relaxed and had cooled off.  In the ED his labs showed stable electrolytes, POC troponin negative x2, hemoglobin 16.5.  Chest x-ray was negative.  EKG showed normal sinus rhythm with no acute ST/T wave abnormalities.  He has not had any recurrent symptoms while in the ER.  Home Medications    Prior to Admission medications   Medication Sig Start Date End Date Taking? Authorizing Provider  aspirin EC 81 MG tablet Take 81 mg by mouth daily.    [provider]    atorvastatin (LIPITOR) 80 MG tablet TAKE 1 TABLET (80 MG TOTAL) BY MOUTH DAILY AT 6 PM. 07/15/17   Troy Sine, MD  BRILINTA 60 MG TABS tablet TAKE 1 TABLET BY MOUTH TWICE A DAY 11/16/16   Troy Sine, MD  carvedilol (COREG) 6.25 MG tablet TAKE 1 TABLET (6.25 MG TOTAL) BY MOUTH 2 (TWO) TIMES DAILY WITH A MEAL. 07/15/17   Troy Sine, MD  diphenhydramine-acetaminophen (TYLENOL PM) 25-500 MG TABS tablet Take 2 tablets by mouth at bedtime as needed (pain / sleep).    [provider]  isosorbide mononitrate (IMDUR) 60 MG 24 hr tablet Take 1 tablet (60 mg total) by mouth daily. 01/15/17   Troy Sine, MD  lisinopril (PRINIVIL,ZESTRIL) 10 MG tablet TAKE 0.5 TABLETS (5 MG TOTAL) BY MOUTH DAILY. 05/03/17   Troy Sine, MD  nitroGLYCERIN (NITROSTAT) 0.4 MG SL tablet Place 1 tablet (0.4 mg total) under the tongue every 5 (five) minutes x 3 doses as needed for chest pain. 03/24/16   Corotto, Sammuel Hines, MD  pantoprazole (PROTONIX) 40 MG tablet TAKE 1 TABLET (40 MG TOTAL) BY MOUTH DAILY. 07/15/17   Troy Sine, MD    Family History    Family History  Problem Relation Age of Onset  . Heart disease Mother   . Cancer Father        Lung, was a smoker    Social History    Social History   Socioeconomic History  . Marital status: Married    Spouse name: Not on file  . Number of children: Not on file  . Years of education: Not on file  . Highest education level: Not on file  Occupational History  . Not on file  Social Needs  . Financial resource strain: Not on file  . Food insecurity:    Worry: Not on file    Inability: Not on file  . Transportation needs:    Medical: Not on file    Non-medical: Not on file  Tobacco Use  . Smoking status: Former Smoker    Last attempt to quit: 01/08/2009    Years since quitting: 8.5  . Smokeless tobacco: Never Used  Substance and Sexual Activity  . Alcohol use: Yes    Alcohol/week: 12.6 oz    Types: 21 Standard drinks or equivalent per  week  . Drug use: No  . Sexual activity: Yes    Partners: Female  Lifestyle  . Physical activity:    Days per week: Not on file    Minutes per session: Not on file  . Stress: Not on file  Relationships  . Social connections:    Talks on phone: Not on file    Gets together: Not on file    Attends religious service: Not  on file    Active member of club or organization: Not on file    Attends meetings of clubs or organizations: Not on file    Relationship status: Not on file  . Intimate partner violence:    Fear of current or ex partner: Not on file    Emotionally abused: Not on file    Physically abused: Not on file    Forced sexual activity: Not on file  Other Topics Concern  . Not on file  Social History Narrative  . Not on file     Review of Systems    See HPI  All other systems reviewed and are otherwise negative except as noted above.  Physical Exam    Blood pressure 115/88, pulse 69, temperature 98.9 F (37.2 C), temperature source Oral, resp. rate (!) 8, height 5\' 7"  (1.702 m), weight 200 lb (90.7 kg), SpO2 98 %.  General: Pleasant, older WM, NAD Psych: Normal affect. Neuro: Alert and oriented X 3. Moves all extremities spontaneously. HEENT: Normal  Neck: Supple, no JVD. Lungs:  Resp regular and unlabored, CTA. Heart: RRR no s3, s4, or murmurs. Abdomen: Soft, non-tender, non-distended, BS + x 4.  Extremities: No clubbing, cyanosis or edema. DP/PT/Radials 2+ and equal bilaterally.  Labs    Troponin [facility] of Care Test) Recent Labs    07/16/17 1508  TROPIPOC 0.00   No results for input(s): CKTOTAL, CKMB, TROPONINI in the last 72 hours. Lab Results  Component Value Date   WBC 10.3 07/16/2017   HGB 16.5 07/16/2017   HCT 48.1 07/16/2017   MCV 99.8 07/16/2017   PLT 210 07/16/2017    Recent Labs  Lab 07/16/17 1202  NA 139  K 4.4  CL 103  CO2 22  BUN 16  CREATININE 1.19  CALCIUM 10.5*  GLUCOSE 128*   Lab Results  Component Value Date   CHOL  132 02/05/2017   HDL 56 02/05/2017   LDLCALC 56 02/05/2017   TRIG 98 02/05/2017   No results found for: [facility]   Radiology Studies    Dg Chest 2 View  Result Date: 07/16/2017 CLINICAL DATA:  Left chest pain.  Diaphoresis and dizziness. EXAM: CHEST - 2 VIEW COMPARISON:  October 15, 2015 FINDINGS: The heart size and mediastinal contours are within normal limits. Both lungs are clear. The visualized skeletal structures are unremarkable. IMPRESSION: No active cardiopulmonary disease. Electronically Signed   By: Dorise Bullion III M.D   On: 07/16/2017 12:52    ECG & Cardiac Imaging    EKG: SR  TTE: 1/17  Study Conclusions  - Left ventricle: The cavity size was normal. Wall thickness was   increased in a pattern of mild LVH. Systolic function was normal.   The estimated ejection fraction was in the range of 60% to 65%.   Wall motion was normal; there were no regional wall motion   abnormalities. Doppler parameters are consistent with abnormal   left ventricular relaxation (grade 1 diastolic dysfunction). The   E/e&' ratio is between 8-15, suggesting indeterminate LV filling   pressure. - Left atrium: The atrium was normal in size. - Inferior vena cava: The vessel was normal in size. The   respirophasic diameter changes were in the normal range (>= 50%),   consistent with normal central venous pressure.  Impressions:  - LVEF 60-65%, mild LVH, normal wall motion, diastolic dysfunction,   indeterminate LV filling pressure, normal LA size, normal IVC.  Assessment & Plan    67 yo  male with PMH of CAD s/p mLAD PCI/DES ('16), HTN, HL, Ischemic cardiomyopathy, and OSA who presented with chest pain.   1. Chest pain: Was working this afternoon on a ladder outside and became hot. Developed chest discomfort and pain between his shoulder, as well as diaphoresis. Take 2 SL nitro without relief and presented to the ED. Symptoms resolved by the time he reached the ED. EKG showed SR, POC trop  neg x2. Currently feels well. He does not normally have any exertional symptoms and works a physical job. Suspect that he may have just become overheated while working as symptoms improved once he was inside and resting. Given his EKG is without ischemia and trops neg x2, reasonable to discharge home and keep his follow up in the office on Monday as previously arranged.  -- will arrange for outpatient stress test.   2. CAD s/p mLAD PCI/DES ('16): Had a stress test back in 10/17 that was low risk. At his last office visit his Brilinta was reduced to 60mg  BID along with ASA. He is very complaint with his medications. Also on statin, and BB therapy  3. Hx of ICM: after MI had EF of 35%, but improved to normal on last echo in 1/17. No signs of HF on exam.  4. HL: On statin, LDL back in 1/19 56.   5. HTN: stable with current therapy.   Barnet Pall, NP-C Pager [phone] 07/16/2017, 3:33 PM  Patient seen, examined. Available data reviewed. Agree with findings, assessment, and plan as outlined by Reino Bellis, NP.  The patient is independently interviewed and examined.  He is an alert, oriented male in no distress.  JVP is normal.  Lung fields are clear.  Heart is regular rate and rhythm with no murmur gallop.  Abdomen is soft and nontender.  Extremities show no edema.  EKG is within normal limits.  Troponins are negative x2.  I carefully reviewed the patient's history and discussed his clinical presentation. He is been very compliant with his medical program and remains on dual antiplatelet therapy.  With normal troponin values, normal EKG, and now back to baseline with no residual chest discomfort, I feel he can be safely discharged from the hospital for further outpatient evaluation.  He is already scheduled to be seen next week.  We will arrange an outpatient exercise Myoview stress test at the next available time.  He should continue on his current medical program.  He understands that  if he develops recurrent chest pain he should seek immediate medical attention.  Sherren Mocha, M.D. 07/16/2017 4:45 PM

## 2017-07-16 NOTE — ED Provider Notes (Addendum)
[facility] EMERGENCY DEPARTMENT Provider Note   CSN: [id number] Arrival date & time: 07/16/17  1155     History   Chief Complaint Chief Complaint  Patient presents with  . Chest Pain    HPI Vincent Flores is a 67 y.o. male.  HPI Patient presents with chest pain.  Began earlier today while working outside in the hot weather.  Reportedly presented pale and diaphoretic.  Developed pain in his upper chest and going to his arm.  States it felt like his previous heart attack.  Pain improved greatly after nitroglycerin.  Still some slight back pain.  Patient is feeling better.  No recent chest pain but has not been doing the level of activity dictated today.  No recent changes in medications.  No fevers.  No cough.  Mild shortness of breath with the episode. Past Medical History:  Diagnosis Date  . CAD (coronary artery disease)    a. NSTEMI 10/16: mid LAD 100% >> PCI with Synergy DES  . HTN (hypertension)   . Hyperlipidemia   . Ischemic cardiomyopathy    a. Echo 10/16: Moderate LVH, EF 35-40%, anteroseptal akinesis, grade 1 diastolic;  b. Echo 2/95: mild LVH, EF 60-65%, no RWMA, Gr 1 DD.    Patient Active Problem List   Diagnosis Date Noted  . Unstable angina ([place]) 10/15/2015  . CAD in native artery, with NSTEMI 10/2015, stent to LAD, residual disease in other vessels 10/15/2015  . Hx of myocardial infarction, 10/2014 10/15/2015  . Chest pain   . Hemorrhoid 05/18/2015  . GERD (gastroesophageal reflux disease) 01/15/2015  . Essential hypertension 11/01/2014  . Hyperlipidemia 11/01/2014  . NSTEMI (non-ST elevated myocardial infarction) ([place]) 10/29/2014  . Abnormal EKG 02/14/2012    Past Surgical History:  Procedure Laterality Date  . APPENDECTOMY  1967  . CARDIAC CATHETERIZATION N/A 10/29/2014   Procedure: Left Heart Cath and Coronary Angiography;  Surgeon: Belva Crome, MD;  Location: [facility];  Service: Cardiovascular;  Laterality: N/A;  .  CARDIAC CATHETERIZATION N/A 10/29/2014   Procedure: Coronary Stent Intervention;  Surgeon: Belva Crome, MD;  Location: [facility];  Service: Cardiovascular;  Laterality: N/A;  . CORONARY STENT PLACEMENT    . TRANSTHORACIC ECHOCARDIOGRAM  [id number]   normal global wall motion.  normal systolic global function.  calculated EF 61 %        Home Medications    Prior to Admission medications   Medication Sig Start Date End Date Taking? Authorizing Provider  aspirin EC 81 MG tablet Take 81 mg by mouth daily.    [provider]  atorvastatin (LIPITOR) 80 MG tablet TAKE 1 TABLET (80 MG TOTAL) BY MOUTH DAILY AT 6 PM. 07/15/17   Troy Sine, MD  BRILINTA 60 MG TABS tablet TAKE 1 TABLET BY MOUTH TWICE A DAY 11/16/16   Troy Sine, MD  carvedilol (COREG) 6.25 MG tablet TAKE 1 TABLET (6.25 MG TOTAL) BY MOUTH 2 (TWO) TIMES DAILY WITH A MEAL. 07/15/17   Troy Sine, MD  diphenhydramine-acetaminophen (TYLENOL PM) 25-500 MG TABS tablet Take 2 tablets by mouth at bedtime as needed (pain / sleep).    [provider]  isosorbide mononitrate (IMDUR) 60 MG 24 hr tablet Take 1 tablet (60 mg total) by mouth daily. 01/15/17   Troy Sine, MD  lisinopril (PRINIVIL,ZESTRIL) 10 MG tablet TAKE 0.5 TABLETS (5 MG TOTAL) BY MOUTH DAILY. 05/03/17   Troy Sine, MD  nitroGLYCERIN (NITROSTAT)  0.4 MG SL tablet Place 1 tablet (0.4 mg total) under the tongue every 5 (five) minutes x 3 doses as needed for chest pain. 03/24/16   Corotto, Sammuel Hines, MD  pantoprazole (PROTONIX) 40 MG tablet TAKE 1 TABLET (40 MG TOTAL) BY MOUTH DAILY. 07/15/17   Troy Sine, MD    Family History Family History  Problem Relation Age of Onset  . Heart disease Mother   . Cancer Father        Lung, was a smoker    Social History Social History   Tobacco Use  . Smoking status: Former Smoker    Last attempt to quit: 01/08/2009    Years since quitting: 8.5  . Smokeless tobacco: Never Used  Substance Use Topics   . Alcohol use: Yes    Alcohol/week: 12.6 oz    Types: 21 Standard drinks or equivalent per week  . Drug use: No     Allergies   Patient has no known allergies.   Review of Systems Review of Systems  Constitutional: Positive for diaphoresis. Negative for appetite change.  HENT: Negative for congestion.   Respiratory: Positive for shortness of breath.   Cardiovascular: Positive for chest pain.  Gastrointestinal: Negative for abdominal pain.  Genitourinary: Negative for flank pain.  Musculoskeletal: Negative for back pain.  Skin: Negative for rash.  Neurological: Negative for weakness.  Hematological: Negative for adenopathy.  Psychiatric/Behavioral: Negative for confusion.     Physical Exam Updated Vital Signs BP 115/88   Pulse 69   Temp 98.9 F (37.2 C) (Oral)   Resp (!) 8   Ht 5\' 7"  (1.702 m)   Wt 90.7 kg (200 lb)   SpO2 98%   BMI 31.32 kg/m   Physical Exam  Constitutional: He appears well-developed.  HENT:  Head: Normocephalic.  Eyes: Pupils are equal, round, and reactive to light.  Neck: Neck supple.  Cardiovascular: Normal rate and regular rhythm.  Pulmonary/Chest: Effort normal.  Abdominal: There is no tenderness.  Musculoskeletal:       Right lower leg: He exhibits no tenderness.       Left lower leg: He exhibits no tenderness.  Neurological: He is alert.  Skin: Skin is warm. Capillary refill takes less than 2 seconds.     ED Treatments / Results  Labs (all labs ordered are listed, but only abnormal results are displayed) Labs Reviewed  BASIC METABOLIC PANEL - Abnormal; Notable for the following components:      Result Value   Glucose, Bld 128 (*)    Calcium 10.5 (*)    All other components within normal limits  CBC - Abnormal; Notable for the following components:   MCH 34.2 (*)    All other components within normal limits  I-STAT TROPONIN, ED  I-STAT TROPONIN, ED    EKG EKG Interpretation  Date/Time:  Tuesday July 16 2017 12:02:44  EDT Ventricular Rate:  99 PR Interval:  120 QRS Duration: 84 QT Interval:  340 QTC Calculation: 436 R Axis:   71 Text Interpretation:  Normal sinus rhythm Normal ECG Confirmed by Davonna Belling [phone]) on 07/16/2017 12:11:48 PM   EKG Interpretation  Date/Time:  Tuesday July 16 2017 12:23:04 EDT Ventricular Rate:  70 PR Interval:  120 QRS Duration: 92 QT Interval:  387 QTC Calculation: 418 R Axis:   69 Text Interpretation:  Sinus rhythm Confirmed by Davonna Belling [phone]) on 07/16/2017 12:32:46 PM       Radiology Dg Chest 2 View  Result Date: 07/16/2017  CLINICAL DATA:  Left chest pain.  Diaphoresis and dizziness. EXAM: CHEST - 2 VIEW COMPARISON:  October 15, 2015 FINDINGS: The heart size and mediastinal contours are within normal limits. Both lungs are clear. The visualized skeletal structures are unremarkable. IMPRESSION: No active cardiopulmonary disease. Electronically Signed   By: Dorise Bullion III M.D   On: 07/16/2017 12:52    Procedures Procedures (including critical care time)  Medications Ordered in ED Medications - No data to display   Initial Impression / Assessment and Plan / ED Course  I have reviewed the triage vital signs and the nursing notes.  Pertinent labs & imaging results that were available during my care of the patient were reviewed by me and considered in my medical decision making (see chart for details).     Patient with chest pain.  Feels like previous angina but however is pain-free now.  Was diaphoretic.  Troponins negative x2.  To be seen by cardiology.  Has known cardiac disease. Final Clinical Impressions(s) / ED Diagnoses   Final diagnoses:  Chest pain, unspecified type    ED Discharge Orders    None       Davonna Belling, MD 07/16/17 1539    Davonna Belling, MD 08/06/17 1444

## 2017-07-16 NOTE — ED Notes (Addendum)
Patient pale and extremely diaphoretic.

## 2017-07-17 ENCOUNTER — Telehealth (HOSPITAL_COMMUNITY): Payer: Self-pay | Admitting: *Deleted

## 2017-07-17 NOTE — Telephone Encounter (Signed)
Patient given detailed instructions per Myocardial Perfusion Study Information Sheet for the test on 07/19/17 at 7:45. Patient notified to arrive 15 minutes early and that it is imperative to arrive on time for appointment to keep from having the test rescheduled.  If you need to cancel or reschedule your appointment, please call the office within 24 hours of your appointment. . Patient verbalized understanding.Vincent Flores

## 2017-07-18 NOTE — Progress Notes (Signed)
Cardiology Office Note   Date:  07/22/2017   ID:  Vincent Flores, DOB 1950/10/22, MRN [id number]  PCP:  Patient, No Pcp Per  Cardiologist:  [facility]   Chief Complaint  Patient presents with  . Follow-up    denies chest pains at visit, denies SOB, denies swelling in hands/feet, stress test on Friday     History of Present Illness: Vincent Flores is a 67 y.o. male who presents for ongoing assessment and management of CAD, with hx of PCI with DES of the mid LAD in 2016, cardia cath in 2016 demonstrated total occlusion of the proximal LAD, with both right and left collaterals. He was also noted to have concomitant CAD with 40% to 50% mid and 60% distal RCA stenosis; ischemic cardiomyopathy, recent EF revealed normalization of LVEF to 60%-65% with grade I diastolic dysfunction. Due to recurrent chest pain he had repeat stress test on 10/17 which was negative for new areas of ischemia. He is followed by Dr. Claiborne Billings of [facility].   He was seen in the ER on 07/16/2017 with recurrent chest pain. He was ruled out for ACS. He was to follow up as OP for repeat stress test. Results are below.   Study Highlights     Nuclear stress EF: 55%.  There was no ST segment deviation noted during stress.  No T wave inversion was noted during stress.  Defect 1: There is a small defect of mild severity present in the apex location.  This is a low risk study.   There is a tiny apical fixed defect, consider apical thinning artifact versus small scar of previous LAD territory infarction. Low risk stress nuclear study with otherwise normal perfusion and normal left ventricular regional and global systolic function.   He is without further complaints. He has not any further discomfort since ER visit. He is drinking plenty of water. He is trying to avoid being out in the heat, but his job requires some outdoor work. He is considering other options.    Past Medical History:  Diagnosis Date  . CAD (coronary artery  disease)    a. NSTEMI 10/16: mid LAD 100% >> PCI with Synergy DES  . HTN (hypertension)   . Hyperlipidemia   . Ischemic cardiomyopathy    a. Echo 10/16: Moderate LVH, EF 35-40%, anteroseptal akinesis, grade 1 diastolic;  b. Echo 4/69: mild LVH, EF 60-65%, no RWMA, Gr 1 DD.    Past Surgical History:  Procedure Laterality Date  . APPENDECTOMY  1967  . CARDIAC CATHETERIZATION N/A 10/29/2014   Procedure: Left Heart Cath and Coronary Angiography;  Surgeon: Belva Crome, MD;  Location: [place] [facility];  Service: Cardiovascular;  Laterality: N/A;  . CARDIAC CATHETERIZATION N/A 10/29/2014   Procedure: Coronary Stent Intervention;  Surgeon: Belva Crome, MD;  Location: [place] [facility];  Service: Cardiovascular;  Laterality: N/A;  . CORONARY STENT PLACEMENT    . TRANSTHORACIC ECHOCARDIOGRAM  [id number]   normal global wall motion.  normal systolic global function.  calculated EF 61 %     Current Outpatient Medications  Medication Sig Dispense Refill  . aspirin EC 81 MG tablet Take 81 mg by mouth daily.    Marland Kitchen atorvastatin (LIPITOR) 80 MG tablet TAKE 1 TABLET (80 MG TOTAL) BY MOUTH DAILY AT 6 PM. 30 tablet 9  . BRILINTA 60 MG TABS tablet TAKE 1 TABLET BY MOUTH TWICE A DAY 60 tablet 11  . carvedilol (COREG) 6.25 MG tablet TAKE 1 TABLET (6.25 MG  TOTAL) BY MOUTH 2 (TWO) TIMES DAILY WITH A MEAL. 60 tablet 9  . diphenhydramine-acetaminophen (TYLENOL PM) 25-500 MG TABS tablet Take 2 tablets by mouth at bedtime as needed (pain / sleep).    . isosorbide mononitrate (IMDUR) 60 MG 24 hr tablet Take 1 tablet (60 mg total) by mouth daily. 90 tablet 1  . lisinopril (PRINIVIL,ZESTRIL) 10 MG tablet TAKE 0.5 TABLETS (5 MG TOTAL) BY MOUTH DAILY. 45 tablet 3  . nitroGLYCERIN (NITROSTAT) 0.4 MG SL tablet Place 1 tablet (0.4 mg total) under the tongue every 5 (five) minutes x 3 doses as needed for chest pain. 25 tablet 3  . pantoprazole (PROTONIX) 40 MG tablet TAKE 1 TABLET (40 MG TOTAL) BY MOUTH DAILY. 30 tablet  9   No current facility-administered medications for this visit.     Allergies:   Patient has no known allergies.    Social History:  The patient  reports that he quit smoking about 8 years ago. He has never used smokeless tobacco. He reports that he drinks about 12.6 oz of alcohol per week. He reports that he does not use drugs.   Family History:  The patient's family history includes Cancer in his father; Heart disease in his mother.    ROS: All other systems are reviewed and negative. Unless otherwise mentioned in H&P    PHYSICAL EXAM: VS:  BP 122/72 (BP Location: Left Arm, Patient Position: Sitting)   Pulse 75   Ht 5\' 6"  (1.676 m)   Wt 203 lb 3.2 oz (92.2 kg)   SpO2 95%   BMI 32.80 kg/m  , BMI Body mass index is 32.8 kg/m. GEN: Well nourished, well developed, in no acute distress obese  HEENT: normal  Neck: no JVD, carotid bruits, or masses Cardiac: RRR; no murmurs, rubs, or gallops,no edema  Respiratory:  clear to auscultation bilaterally, normal work of breathing GI: soft, nontender, nondistended, + BS MS: no deformity or atrophy  Skin: warm and dry, no rash Neuro:  Strength and sensation are intact Psych: euthymic mood, full affect   EKG:  Not completed during this office visit.   Recent Labs: 02/05/2017: ALT 24; TSH 1.980 07/16/2017: BUN 16; Creatinine, Ser 1.19; Hemoglobin 16.5; Platelets 210; Potassium 4.4; Sodium 139    Lipid Panel    Component Value Date/Time   CHOL 132 02/05/2017 0807   TRIG 98 02/05/2017 0807   HDL 56 02/05/2017 0807   CHOLHDL 2.4 02/05/2017 0807   CHOLHDL 3.0 11/17/2015 0804   VLDL 19 11/17/2015 0804   LDLCALC 56 02/05/2017 0807      Wt Readings from Last 3 Encounters:  07/22/17 203 lb 3.2 oz (92.2 kg)  07/19/17 220 lb (99.8 kg)  07/16/17 200 lb (90.7 kg)      Other studies Reviewed: Echocardiogram 03/09/15  Left ventricle: The cavity size was normal. Wall thickness was   increased in a pattern of mild LVH. Systolic  function was normal.   The estimated ejection fraction was in the range of 60% to 65%.   Wall motion was normal; there were no regional wall motion   abnormalities. Doppler parameters are consistent with abnormal   left ventricular relaxation (grade 1 diastolic dysfunction). The   E/e&' ratio is between 8-15, suggesting indeterminate LV filling   pressure. - Left atrium: The atrium was normal in size. - Inferior vena cava: The vessel was normal in size. The   respirophasic diameter changes were in the normal range (>= 50%),   consistent with  normal central venous pressure.  Impressions:  - LVEF 60-65%, mild LVH, normal wall motion, diastolic dysfunction,   indeterminate LV filling pressure, normal LA size, normal IVC.   ASSESSMENT AND PLAN:  1. CAD: Had recurrent chest pain after working outside the heat climbing a ladder. He was ruled out for ACS during ER visit. Stress test was low risk and did not indicate any new areas of ischemia: I have discussed the results with the patient in clinic today. He is significantly relieved by the results. He is without further complaints. I will refill his NTG sublingual for fresh bottle. Continue DAPT. Check BMET and CBC.  2. Hypercholesterolemia: He is on atorvastatin. I will recheck lipids and LFT's, today.   3.  Hypertension: Well controlled. No changes in medication regimen.   Current medicines are reviewed at length with the patient today.    Labs/ tests ordered today include: CBC, BMET, Lipids and LFT's, PSA, and Hgb A1c  Phill Myron. West Pugh, ANP, AACC   07/22/2017 11:35 AM    [place]. [address] [place], [place] [address] Phone: [phone]; Fax: [phone]

## 2017-07-19 ENCOUNTER — Ambulatory Visit (HOSPITAL_COMMUNITY): Payer: 59 | Attending: Cardiovascular Disease

## 2017-07-19 DIAGNOSIS — I10 Essential (primary) hypertension: Secondary | ICD-10-CM | POA: Insufficient documentation

## 2017-07-19 DIAGNOSIS — R079 Chest pain, unspecified: Secondary | ICD-10-CM | POA: Insufficient documentation

## 2017-07-19 DIAGNOSIS — I2510 Atherosclerotic heart disease of native coronary artery without angina pectoris: Secondary | ICD-10-CM | POA: Insufficient documentation

## 2017-07-19 DIAGNOSIS — R9439 Abnormal result of other cardiovascular function study: Secondary | ICD-10-CM | POA: Diagnosis not present

## 2017-07-19 LAB — MYOCARDIAL PERFUSION IMAGING
LV dias vol: 90 mL (ref 62–150)
LV sys vol: 1 mL
Peak HR: 94 {beats}/min
RATE: 0.34
Rest HR: 61 {beats}/min
SDS: 0
SRS: 10
SSS: 10
TID: 1.04

## 2017-07-19 MED ORDER — TECHNETIUM TC 99M TETROFOSMIN IV KIT
10.1000 | PACK | Freq: Once | INTRAVENOUS | Status: AC | PRN
  Administered 2017-07-19: 10.1 via INTRAVENOUS
  Filled 2017-07-19: qty 11

## 2017-07-19 MED ORDER — TECHNETIUM TC 99M TETROFOSMIN IV KIT
31.3000 | PACK | Freq: Once | INTRAVENOUS | Status: AC | PRN
  Administered 2017-07-19: 31.3 via INTRAVENOUS
  Filled 2017-07-19: qty 32

## 2017-07-19 MED ORDER — REGADENOSON 0.4 MG/5ML IV SOLN
0.4000 mg | Freq: Once | INTRAVENOUS | Status: AC
  Administered 2017-07-19: 0.4 mg via INTRAVENOUS

## 2017-07-22 ENCOUNTER — Encounter: Payer: Self-pay | Admitting: Adult Health

## 2017-07-22 ENCOUNTER — Ambulatory Visit: Payer: 59 | Admitting: Adult Health

## 2017-07-22 VITALS — BP 122/72 | HR 75 | Ht 66.0 in | Wt 203.2 lb

## 2017-07-22 DIAGNOSIS — Z79899 Other long term (current) drug therapy: Secondary | ICD-10-CM | POA: Diagnosis not present

## 2017-07-22 DIAGNOSIS — I2510 Atherosclerotic heart disease of native coronary artery without angina pectoris: Secondary | ICD-10-CM | POA: Diagnosis not present

## 2017-07-22 DIAGNOSIS — I10 Essential (primary) hypertension: Secondary | ICD-10-CM

## 2017-07-22 DIAGNOSIS — E782 Mixed hyperlipidemia: Secondary | ICD-10-CM | POA: Diagnosis not present

## 2017-07-22 MED ORDER — NITROGLYCERIN 0.4 MG SL SUBL: 0.4000 mg | SUBLINGUAL_TABLET | SUBLINGUAL | 3 refills | Status: AC | PRN

## 2017-07-22 MED ORDER — NITROGLYCERIN 0.4 MG SL SUBL: 0.4000 mg | SUBLINGUAL_TABLET | SUBLINGUAL | 3 refills | Status: DC | PRN

## 2017-07-22 NOTE — Patient Instructions (Signed)
Medication Instructions:  NO CHANGES- Your physician recommends that you continue on your current medications as directed. Please refer to the Current Medication list given to you today.  If you need a refill on your cardiac medications before your next appointment, please call your pharmacy.  Labwork: A1C,BMET,CBC,FLP, PSA AND LFT TODAY HERE IN OUR OFFICE AT [facility]  Take the provided lab slips with you to the lab for your blood draw.   Follow-Up: Your physician wants you to follow-up in: [place] should receive a reminder letter in the mail two months in advance. If you do not receive a letter, please call our office NOV 2019 to schedule the JAN 2020 follow-up appointment.   Thank you for choosing [facility] at [facility]!!

## 2017-07-23 ENCOUNTER — Other Ambulatory Visit: Payer: Self-pay

## 2017-07-23 DIAGNOSIS — E782 Mixed hyperlipidemia: Secondary | ICD-10-CM

## 2017-07-23 DIAGNOSIS — Z79899 Other long term (current) drug therapy: Secondary | ICD-10-CM

## 2017-07-23 LAB — BASIC METABOLIC PANEL
BUN/Creatinine Ratio: 15 (ref 10–24)
BUN: 14 mg/dL (ref 8–27)
CO2: 21 mmol/L (ref 20–29)
Calcium: 9.9 mg/dL (ref 8.6–10.2)
Chloride: 104 mmol/L (ref 96–106)
Creatinine, Ser: 0.95 mg/dL (ref 0.76–1.27)
GFR calc Af Amer: 96 mL/min/{1.73_m2} (ref >59)
GFR calc non Af Amer: 83 mL/min/{1.73_m2} (ref >59)
Glucose: 108 mg/dL — ABNORMAL HIGH (ref 65–99)
Potassium: 4.8 mmol/L (ref 3.5–5.2)
Sodium: 139 mmol/L (ref 134–144)

## 2017-07-23 LAB — HEMOGLOBIN A1C
Est. average glucose Bld gHb Est-mCnc: 105 mg/dL
Hgb A1c MFr Bld: 5.3 % (ref 4.8–5.6)

## 2017-07-23 LAB — HEPATIC FUNCTION PANEL
ALT: 27 IU/L (ref 0–44)
AST: 20 IU/L (ref 0–40)
Albumin: 4.6 g/dL (ref 3.6–4.8)
Alkaline Phosphatase: 90 IU/L (ref 39–117)
Bilirubin Total: 0.5 mg/dL (ref 0.0–1.2)
Bilirubin, Direct: 0.15 mg/dL (ref 0.00–0.40)
Total Protein: 6.9 g/dL (ref 6.0–8.5)

## 2017-07-23 LAB — CBC
Hematocrit: 41.9 % (ref 37.5–51.0)
Hemoglobin: 14.6 g/dL (ref 13.0–17.7)
MCH: 34.4 pg — ABNORMAL HIGH (ref 26.6–33.0)
MCHC: 34.8 g/dL (ref 31.5–35.7)
MCV: 99 fL — ABNORMAL HIGH (ref 79–97)
Platelets: 152 10*3/uL (ref 150–450)
RBC: 4.24 x10E6/uL (ref 4.14–5.80)
RDW: 12.6 % (ref 12.3–15.4)
WBC: 6.1 10*3/uL (ref 3.4–10.8)

## 2017-07-23 LAB — LIPID PANEL
Chol/HDL Ratio: 2.6 ratio (ref 0.0–5.0)
Cholesterol, Total: 131 mg/dL (ref 100–199)
HDL: 51 mg/dL (ref >39)
LDL Calculated: 56 mg/dL (ref 0–99)
Triglycerides: 119 mg/dL (ref 0–149)
VLDL Cholesterol Cal: 24 mg/dL (ref 5–40)

## 2017-07-23 LAB — PSA: Prostate Specific Ag, Serum: 0.5 ng/mL (ref 0.0–4.0)

## 2017-07-23 LAB — TSH: TSH: 2.2 u[IU]/mL (ref 0.450–4.500)

## 2017-07-23 MED ORDER — ATORVASTATIN CALCIUM 40 MG PO TABS: 40.0000 mg | ORAL_TABLET | Freq: Every day | ORAL | 11 refills | Status: DC

## 2017-10-03 ENCOUNTER — Other Ambulatory Visit: Payer: Self-pay | Admitting: Cardiovascular Disease

## 2017-11-10 ENCOUNTER — Other Ambulatory Visit: Payer: Self-pay | Admitting: Cardiovascular Disease

## 2017-11-11 NOTE — Telephone Encounter (Signed)
Rx(s) sent to pharmacy electronically.  

## 2017-11-15 ENCOUNTER — Other Ambulatory Visit: Payer: Self-pay | Admitting: *Deleted

## 2017-11-15 DIAGNOSIS — E782 Mixed hyperlipidemia: Secondary | ICD-10-CM

## 2017-11-15 DIAGNOSIS — Z79899 Other long term (current) drug therapy: Secondary | ICD-10-CM

## 2017-11-15 LAB — LIPID PANEL
Chol/HDL Ratio: 2.6 ratio (ref 0.0–5.0)
Cholesterol, Total: 134 mg/dL (ref 100–199)
HDL: 51 mg/dL (ref >39)
LDL Calculated: 69 mg/dL (ref 0–99)
Triglycerides: 71 mg/dL (ref 0–149)
VLDL Cholesterol Cal: 14 mg/dL (ref 5–40)

## 2018-01-16 DIAGNOSIS — D225 Melanocytic nevi of trunk: Secondary | ICD-10-CM | POA: Diagnosis not present

## 2018-01-16 DIAGNOSIS — L821 Other seborrheic keratosis: Secondary | ICD-10-CM | POA: Diagnosis not present

## 2018-02-25 DIAGNOSIS — S83241A Other tear of medial meniscus, current injury, right knee, initial encounter: Secondary | ICD-10-CM | POA: Diagnosis not present

## 2018-04-07 ENCOUNTER — Other Ambulatory Visit: Payer: Self-pay | Admitting: Cardiovascular Disease

## 2018-04-07 DIAGNOSIS — E7800 Pure hypercholesterolemia, unspecified: Secondary | ICD-10-CM

## 2018-04-16 ENCOUNTER — Telehealth: Payer: Self-pay | Admitting: Cardiovascular Disease

## 2018-04-16 NOTE — Telephone Encounter (Signed)
   Primary Cardiologist:  No primary care provider on file.   Patient contacted.  History reviewed.  No symptoms to suggest any unstable cardiac conditions.  Based on discussion, with current pandemic situation, we will be postponing this appointment for Vincent Flores with a plan for f/u in 6-12 wks or sooner if feasible/necessary.  If symptoms change, he has been instructed to contact our office.   Routing to C19 CANCEL pool for tracking (P CV DIV CV19 CANCEL - reason for visit "other.") and assigning priority (1 = 4-6 wks, 2 = 6-12 wks, 3 = >12 wks).   Therisa Doyne  04/16/2018 11:08 AM         .

## 2018-04-23 ENCOUNTER — Ambulatory Visit: Payer: 59 | Admitting: Cardiovascular Disease

## 2018-05-06 ENCOUNTER — Other Ambulatory Visit: Payer: Self-pay | Admitting: Cardiovascular Disease

## 2018-05-06 NOTE — Telephone Encounter (Signed)
Carvedilol 6.25 mg refilled. 

## 2018-05-07 ENCOUNTER — Other Ambulatory Visit: Payer: Self-pay

## 2018-05-07 MED ORDER — PANTOPRAZOLE SODIUM 40 MG PO TBEC: 40.0000 mg | DELAYED_RELEASE_TABLET | Freq: Every day | ORAL | 3 refills | Status: DC

## 2018-06-30 ENCOUNTER — Other Ambulatory Visit: Payer: Self-pay | Admitting: *Deleted

## 2018-06-30 MED ORDER — TICAGRELOR 60 MG PO TABS: 60.0000 mg | ORAL_TABLET | Freq: Two times a day (BID) | ORAL | 0 refills | Status: DC

## 2018-07-08 ENCOUNTER — Telehealth: Payer: Self-pay | Admitting: Cardiovascular Disease

## 2018-07-08 NOTE — Telephone Encounter (Signed)
LVM, reminding pt of his appt with Dr Claiborne Billings on 07-09-18.

## 2018-07-09 ENCOUNTER — Ambulatory Visit (INDEPENDENT_AMBULATORY_CARE_PROVIDER_SITE_OTHER): Payer: 59 | Admitting: Cardiovascular Disease

## 2018-07-09 ENCOUNTER — Other Ambulatory Visit: Payer: Self-pay

## 2018-07-09 ENCOUNTER — Encounter: Payer: Self-pay | Admitting: Cardiovascular Disease

## 2018-07-09 VITALS — BP 120/78 | HR 63 | Temp 97.5°F | Ht 66.0 in | Wt 213.8 lb

## 2018-07-09 DIAGNOSIS — I10 Essential (primary) hypertension: Secondary | ICD-10-CM

## 2018-07-09 DIAGNOSIS — I214 Non-ST elevation (NSTEMI) myocardial infarction: Secondary | ICD-10-CM

## 2018-07-09 DIAGNOSIS — E782 Mixed hyperlipidemia: Secondary | ICD-10-CM

## 2018-07-09 DIAGNOSIS — Z79899 Other long term (current) drug therapy: Secondary | ICD-10-CM

## 2018-07-09 DIAGNOSIS — G4733 Obstructive sleep apnea (adult) (pediatric): Secondary | ICD-10-CM | POA: Diagnosis not present

## 2018-07-09 DIAGNOSIS — I2510 Atherosclerotic heart disease of native coronary artery without angina pectoris: Secondary | ICD-10-CM

## 2018-07-09 LAB — CBC
Hematocrit: 42.1 % (ref 37.5–51.0)
Hemoglobin: 15 g/dL (ref 13.0–17.7)
MCH: 34.6 pg — ABNORMAL HIGH (ref 26.6–33.0)
MCHC: 35.6 g/dL (ref 31.5–35.7)
MCV: 97 fL (ref 79–97)
Platelets: 161 10*3/uL (ref 150–450)
RBC: 4.34 x10E6/uL (ref 4.14–5.80)
RDW: 11.8 % (ref 11.6–15.4)
WBC: 7.1 10*3/uL (ref 3.4–10.8)

## 2018-07-09 LAB — LIPID PANEL
Chol/HDL Ratio: 3.1 ratio (ref 0.0–5.0)
Cholesterol, Total: 160 mg/dL (ref 100–199)
HDL: 51 mg/dL (ref >39)
LDL Calculated: 78 mg/dL (ref 0–99)
Triglycerides: 154 mg/dL — ABNORMAL HIGH (ref 0–149)
VLDL Cholesterol Cal: 31 mg/dL (ref 5–40)

## 2018-07-09 LAB — COMPREHENSIVE METABOLIC PANEL
ALT: 29 IU/L (ref 0–44)
AST: 23 IU/L (ref 0–40)
Albumin/Globulin Ratio: 2.2 (ref 1.2–2.2)
Albumin: 4.6 g/dL (ref 3.8–4.8)
Alkaline Phosphatase: 84 IU/L (ref 39–117)
BUN/Creatinine Ratio: 16 (ref 10–24)
BUN: 15 mg/dL (ref 8–27)
Bilirubin Total: 0.7 mg/dL (ref 0.0–1.2)
CO2: 22 mmol/L (ref 20–29)
Calcium: 9.6 mg/dL (ref 8.6–10.2)
Chloride: 102 mmol/L (ref 96–106)
Creatinine, Ser: 0.95 mg/dL (ref 0.76–1.27)
GFR calc Af Amer: 95 mL/min/{1.73_m2} (ref >59)
GFR calc non Af Amer: 82 mL/min/{1.73_m2} (ref >59)
Globulin, Total: 2.1 g/dL (ref 1.5–4.5)
Glucose: 107 mg/dL — ABNORMAL HIGH (ref 65–99)
Potassium: 4.9 mmol/L (ref 3.5–5.2)
Sodium: 140 mmol/L (ref 134–144)
Total Protein: 6.7 g/dL (ref 6.0–8.5)

## 2018-07-09 LAB — TSH: TSH: 2.2 u[IU]/mL (ref 0.450–4.500)

## 2018-07-09 NOTE — Patient Instructions (Signed)
Medication Instructions:  The current medical regimen is effective;  continue present plan and medications.  If you need a refill on your cardiac medications before your next appointment, please call your pharmacy.   Lab work: Fasting lab work (CBC, CMET, TSH, LIPID) today.  If you have labs (blood work) drawn today and your tests are completely normal, you will receive your results only by: Marland Kitchen MyChart Message (if you have MyChart) OR . A paper copy in the mail If you have any lab test that is abnormal or we need to change your treatment, we will call you to review the results.  Follow-Up: At [facility], you and your health needs are our priority.  As part of our continuing mission to provide you with exceptional heart care, we have created designated Provider Care Teams.  These Care Teams include your primary Cardiologist (physician) and Advanced Practice Providers (APPs -  Physician Assistants and Nurse Practitioners) who all work together to provide you with the care you need, when you need it. You will need a follow up appointment in 3 months. You may see Dr.Kelly or one of the following Advanced Practice Providers on your designated Care Team: Almyra Deforest, [place] . Fabian Sharp, PA-C  Any Other Special Instructions Will Be Listed Below (If Applicable). We will order a new ResMed N30i mask.
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
## 2018-07-10 ENCOUNTER — Telehealth: Payer: Self-pay | Admitting: *Deleted

## 2018-07-10 NOTE — Telephone Encounter (Signed)
Faxed order to [facility] per VO from Dr Claiborne Billings for ResMed N30i mask.

## 2018-07-10 NOTE — Telephone Encounter (Signed)
-----   Message from Caprice Beaver, LPN sent at 04/13/9975 10:36 AM EDT ----- Patient needs a new mask please.  ResMed N30i  DME- choice.  Thanks!

## 2018-07-11 ENCOUNTER — Encounter: Payer: Self-pay | Admitting: Cardiovascular Disease

## 2018-07-28 ENCOUNTER — Other Ambulatory Visit: Payer: Self-pay | Admitting: Cardiovascular Disease

## 2018-08-08 ENCOUNTER — Other Ambulatory Visit: Payer: Self-pay | Admitting: Cardiovascular Disease

## 2018-09-01 ENCOUNTER — Other Ambulatory Visit: Payer: Self-pay

## 2018-09-01 MED ORDER — TICAGRELOR 60 MG PO TABS: 60.0000 mg | ORAL_TABLET | Freq: Two times a day (BID) | ORAL | 0 refills | Status: DC

## 2018-09-02 ENCOUNTER — Other Ambulatory Visit: Payer: Self-pay

## 2018-09-02 MED ORDER — TICAGRELOR 60 MG PO TABS: 60.0000 mg | ORAL_TABLET | Freq: Two times a day (BID) | ORAL | 3 refills | Status: DC

## 2018-10-03 ENCOUNTER — Other Ambulatory Visit: Payer: Self-pay | Admitting: Cardiovascular Disease

## 2018-10-07 ENCOUNTER — Telehealth: Payer: Self-pay | Admitting: Cardiovascular Disease

## 2018-10-07 NOTE — Telephone Encounter (Signed)
Spoke to pt concerning appt with Dr. Claiborne Billings on 10/10/18. Informed pt that Dr. Claiborne Billings will be working remotely that day and his appt has been changed to a virtual visit. Pt stated he would be ok with doing a telephone visit. Pt stated he does have access to his MyChart. Informed pt I will send him a MyChart message with all info and consent for visit and that he can call if he has any questions. Pt verbalized thanks for the call.

## 2018-10-08 DIAGNOSIS — Z86010 Personal history of colonic polyps: Secondary | ICD-10-CM | POA: Diagnosis not present

## 2018-10-08 DIAGNOSIS — K429 Umbilical hernia without obstruction or gangrene: Secondary | ICD-10-CM | POA: Diagnosis not present

## 2018-10-08 DIAGNOSIS — K439 Ventral hernia without obstruction or gangrene: Secondary | ICD-10-CM | POA: Diagnosis not present

## 2018-10-10 ENCOUNTER — Encounter: Payer: Self-pay | Admitting: Cardiovascular Disease

## 2018-10-10 ENCOUNTER — Telehealth (INDEPENDENT_AMBULATORY_CARE_PROVIDER_SITE_OTHER): Payer: 59 | Admitting: Cardiovascular Disease

## 2018-10-10 DIAGNOSIS — I214 Non-ST elevation (NSTEMI) myocardial infarction: Secondary | ICD-10-CM

## 2018-10-10 DIAGNOSIS — E782 Mixed hyperlipidemia: Secondary | ICD-10-CM | POA: Diagnosis not present

## 2018-10-10 DIAGNOSIS — I10 Essential (primary) hypertension: Secondary | ICD-10-CM

## 2018-10-10 DIAGNOSIS — I2510 Atherosclerotic heart disease of native coronary artery without angina pectoris: Secondary | ICD-10-CM | POA: Diagnosis not present

## 2018-10-10 DIAGNOSIS — G4733 Obstructive sleep apnea (adult) (pediatric): Secondary | ICD-10-CM | POA: Diagnosis not present
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
## 2018-10-10 NOTE — Patient Instructions (Addendum)
Medication Instructions:  The current medical regimen is effective;  continue present plan and medications.  If you need a refill on your cardiac medications before your next appointment, please call your pharmacy.   Follow-Up: At [facility], you and your health needs are our priority.  As part of our continuing mission to provide you with exceptional heart care, we have created designated Provider Care Teams.  These Care Teams include your primary Cardiologist (physician) and Advanced Practice Providers (APPs -  Physician Assistants and Nurse Practitioners) who all work together to provide you with the care you need, when you need it. You will need a follow up appointment in 6 months.  Please call our office 2 months in advance to schedule this appointment.  You may see Dr.Kelly or one of the following Advanced Practice Providers on your designated Care Team: Almyra Deforest, [place] . Fabian Sharp, PA-C  Any Other Special Instructions Will Be Listed Below (If Applicable). We will obtain a download in 6 weeks. We have changed your pressure to 12-20, it went sent over to your machine.

## 2019-01-30 ENCOUNTER — Ambulatory Visit: Payer: 59 | Attending: Internal Medicine

## 2019-01-30 DIAGNOSIS — Z23 Encounter for immunization: Secondary | ICD-10-CM

## 2019-01-30 NOTE — Progress Notes (Signed)
   Covid-19 Vaccination Clinic  Name:  Kelsey Calvi    MRN: [id number] DOB: Jun 08, 1950  01/30/2019  Mr. Goldenstein was observed post Covid-19 immunization for 15 minutes without incidence. He was provided with Vaccine Information Sheet and instruction to access the V-Safe system.   Mr. Shellhouse was instructed to call 911 with any severe reactions post vaccine: Marland Kitchen Difficulty breathing  . Swelling of your face and throat  . A fast heartbeat  . A bad rash all over your body  . Dizziness and weakness    Immunizations Administered    Name Date Dose VIS Date Route   Pfizer COVID-19 Vaccine 01/30/2019  3:32 PM 0.3 mL 12/19/2018 Intramuscular   Manufacturer: [place]   Lot: [id number]   [place]: [id number]

## 2019-02-07 ENCOUNTER — Other Ambulatory Visit: Payer: Self-pay | Admitting: Cardiovascular Disease

## 2019-02-13 DIAGNOSIS — I2510 Atherosclerotic heart disease of native coronary artery without angina pectoris: Secondary | ICD-10-CM | POA: Diagnosis not present

## 2019-02-13 DIAGNOSIS — Z7689 Persons encountering health services in other specified circumstances: Secondary | ICD-10-CM | POA: Diagnosis not present

## 2019-02-13 DIAGNOSIS — E782 Mixed hyperlipidemia: Secondary | ICD-10-CM | POA: Diagnosis not present

## 2019-02-20 ENCOUNTER — Ambulatory Visit: Payer: 59 | Attending: Internal Medicine

## 2019-02-20 DIAGNOSIS — Z23 Encounter for immunization: Secondary | ICD-10-CM

## 2019-02-20 NOTE — Progress Notes (Signed)
   Covid-19 Vaccination Clinic  Name:  Deep Simerson    MRN: [id number] DOB: [birth year]  02/20/2019  Mr. Kibby was observed post Covid-19 immunization for 15 minutes without incidence. He was provided with Vaccine Information Sheet and instruction to access the V-Safe system.   Mr. Mine was instructed to call 911 with any severe reactions post vaccine: Marland Kitchen Difficulty breathing  . Swelling of your face and throat  . A fast heartbeat  . A bad rash all over your body  . Dizziness and weakness    Immunizations Administered    Name Date Dose VIS Date Route   Pfizer COVID-19 Vaccine 02/20/2019 10:26 AM 0.3 mL 12/19/2018 Intramuscular   Manufacturer: [place]   Lot: [id number]   [place]: [id number]

## 2019-02-22 ENCOUNTER — Other Ambulatory Visit: Payer: Self-pay | Admitting: Cardiovascular Disease

## 2019-02-27 DIAGNOSIS — E782 Mixed hyperlipidemia: Secondary | ICD-10-CM | POA: Diagnosis not present

## 2019-02-27 DIAGNOSIS — I2510 Atherosclerotic heart disease of native coronary artery without angina pectoris: Secondary | ICD-10-CM | POA: Diagnosis not present

## 2019-03-10 ENCOUNTER — Other Ambulatory Visit: Payer: Self-pay | Admitting: Internal Medicine

## 2019-03-10 DIAGNOSIS — Z87891 Personal history of nicotine dependence: Secondary | ICD-10-CM

## 2019-03-20 ENCOUNTER — Ambulatory Visit
Admission: RE | Admit: 2019-03-20 | Discharge: 2019-03-20 | Disposition: A | Discharge status: Home / Self Care | Payer: 59 | Source: Ambulatory Visit | Attending: Internal Medicine | Admitting: Internal Medicine

## 2019-03-20 DIAGNOSIS — Z87891 Personal history of nicotine dependence: Secondary | ICD-10-CM

## 2019-03-24 ENCOUNTER — Other Ambulatory Visit: Payer: Self-pay | Admitting: Internal Medicine

## 2019-03-24 DIAGNOSIS — Z87891 Personal history of nicotine dependence: Secondary | ICD-10-CM

## 2019-03-29 ENCOUNTER — Other Ambulatory Visit: Payer: Self-pay | Admitting: Cardiovascular Disease

## 2019-03-29 DIAGNOSIS — E7800 Pure hypercholesterolemia, unspecified: Secondary | ICD-10-CM

## 2019-04-17 ENCOUNTER — Ambulatory Visit (INDEPENDENT_AMBULATORY_CARE_PROVIDER_SITE_OTHER): Payer: 59 | Admitting: Physician Assistant

## 2019-04-17 ENCOUNTER — Encounter: Payer: Self-pay | Admitting: Physician Assistant

## 2019-04-17 ENCOUNTER — Other Ambulatory Visit: Payer: Self-pay

## 2019-04-17 VITALS — BP 122/82 | HR 72 | Temp 97.2°F | Ht 66.0 in | Wt 221.6 lb

## 2019-04-17 DIAGNOSIS — E782 Mixed hyperlipidemia: Secondary | ICD-10-CM | POA: Diagnosis not present

## 2019-04-17 DIAGNOSIS — I255 Ischemic cardiomyopathy: Secondary | ICD-10-CM

## 2019-04-17 DIAGNOSIS — I2510 Atherosclerotic heart disease of native coronary artery without angina pectoris: Secondary | ICD-10-CM

## 2019-04-17 DIAGNOSIS — I10 Essential (primary) hypertension: Secondary | ICD-10-CM | POA: Diagnosis not present

## 2019-04-17 NOTE — Progress Notes (Signed)
Cardiology Office Note:    Date:  04/17/2019   ID:  Vincent Flores, DOB 06/01/1950, MRN [id number]  PCP:  Merrilee Seashore, MD  Cardiologist:  Shelva Majestic, MD  Electrophysiologist:  None   Referring MD: No ref. provider found   Chief Complaint  Patient presents with  . Follow-up    seen for Dr. Claiborne Billings.     History of Present Illness:    Vincent Flores is a 69 y.o. male with a hx of HTN, HLD, ICM, OSA and CAD.  Patient was admitted for NSTEMI in October 2016, cardiac catheterization showed total occlusion of proximal to mid LAD with both right to left and left to left collaterals, he underwent successful PCI to LAD with synergy 3.0 x 23 mm DES, he continued to have 60% distal RCA residual.  Initial echocardiogram showed EF of 35 to 40%, however subsequent echocardiogram obtained in January 2017 showed normalization of LVEF to 60 to 65%, grade 1 DD.  Due to recurrent chest pain, he underwent a Myoview in October 2017 that showed minimal region of apical abnormality, EF 54%.  He was managed medically.  Last Myoview obtained on 07/19/2017 showed EF 55%, small defect of mild severity present in the apex location, overall considered low risk study.  Patient presents today for cardiology office visit.  He denies any recent chest pain or shortness of breath.  He is spending majority of the time taking care of his grandkids.  Otherwise he has no lower extremity edema, orthopnea or PND.  He has been compliant with his medication.  I recommended continuing the current therapy.   Past Medical History:  Diagnosis Date  . CAD (coronary artery disease)    a. NSTEMI 10/16: mid LAD 100% >> PCI with Synergy DES  . HTN (hypertension)   . Hyperlipidemia   . Ischemic cardiomyopathy    a. Echo 10/16: Moderate LVH, EF 35-40%, anteroseptal akinesis, grade 1 diastolic;  b. Echo 0000000: mild LVH, EF 60-65%, no RWMA, Gr 1 DD.    Past Surgical History:  Procedure Laterality Date  . APPENDECTOMY  1967  .  CARDIAC CATHETERIZATION N/A 10/29/2014   Procedure: Left Heart Cath and Coronary Angiography;  Surgeon: Belva Crome, MD;  Location: [facility];  Service: Cardiovascular;  Laterality: N/A;  . CARDIAC CATHETERIZATION N/A 10/29/2014   Procedure: Coronary Stent Intervention;  Surgeon: Belva Crome, MD;  Location: [facility];  Service: Cardiovascular;  Laterality: N/A;  . CORONARY STENT PLACEMENT    . INGUINAL HERNIA REPAIR    . TRANSTHORACIC ECHOCARDIOGRAM  [id number]   normal global wall motion.  normal systolic global function.  calculated EF 61 %    Current Medications: Current Meds  Medication Sig  . aspirin EC 81 MG tablet Take 81 mg by mouth daily.  Marland Kitchen atorvastatin (LIPITOR) 40 MG tablet Take 1 tablet (40 mg total) by mouth daily at 6 PM.  . carvedilol (COREG) 6.25 MG tablet TAKE 1 TABLET (6.25 MG TOTAL) BY MOUTH 2 (TWO) TIMES DAILY WITH A MEAL.  . diphenhydramine-acetaminophen (TYLENOL PM) 25-500 MG TABS tablet Take 2 tablets by mouth at bedtime as needed (pain / sleep).  . isosorbide mononitrate (IMDUR) 60 MG 24 hr tablet Take 1 tablet (60 mg total) by mouth daily.  Marland Kitchen lisinopril (ZESTRIL) 10 MG tablet TAKE 1/2 TABLET BY MOUTH DAILY  . nitroGLYCERIN (NITROSTAT) 0.4 MG SL tablet Place 1 tablet (0.4 mg total) under the tongue every 5 (five) minutes x 3 doses  as needed for chest pain.  . pantoprazole (PROTONIX) 40 MG tablet TAKE 1 TABLET BY MOUTH EVERY DAY  . ticagrelor (BRILINTA) 60 MG TABS tablet Take 1 tablet (60 mg total) by mouth 2 (two) times daily.     Allergies:   Patient has no known allergies.   Social History   Socioeconomic History  . Marital status: Married    Spouse name: Not on file  . Number of children: Not on file  . Years of education: Not on file  . Highest education level: Not on file  Occupational History  . Not on file  Tobacco Use  . Smoking status: Former Smoker    Quit date: 01/08/2009    Years since quitting: 10.2  . Smokeless tobacco:  Never Used  Substance and Sexual Activity  . Alcohol use: Yes    Alcohol/week: 21.0 standard drinks    Types: 21 Standard drinks or equivalent per week  . Drug use: No  . Sexual activity: Yes    Partners: Female  Other Topics Concern  . Not on file  Social History Narrative  . Not on file   Social Determinants of Health   Financial Resource Strain:   . Difficulty of Paying Living Expenses:   Food Insecurity:   . Worried About Charity fundraiser in the Last Year:   . Arboriculturist in the Last Year:   Transportation Needs:   . Film/video editor (Medical):   Marland Kitchen Lack of Transportation (Non-Medical):   Physical Activity:   . Days of Exercise per Week:   . Minutes of Exercise per Session:   Stress:   . Feeling of Stress :   Social Connections:   . Frequency of Communication with Friends and Family:   . Frequency of Social Gatherings with Friends and Family:   . Attends Religious Services:   . Active Member of Clubs or Organizations:   . Attends Archivist Meetings:   Marland Kitchen Marital Status:      Family History: The patient's family history includes Cancer in his father; Heart disease in his mother.  ROS:   Please see the history of present illness.     All other systems reviewed and are negative.  EKGs/Labs/Other Studies Reviewed:    The following studies were reviewed today:  Myoview 07/19/2017  Nuclear stress EF: 55%.  There was no ST segment deviation noted during stress.  No T wave inversion was noted during stress.  Defect 1: There is a small defect of mild severity present in the apex location.  This is a low risk study.   There is a tiny apical fixed defect, consider apical thinning artifact versus small scar of previous LAD territory infarction. Low risk stress nuclear study with otherwise normal perfusion and normal left ventricular regional and global systolic function  EKG:  EKG is not ordered today.    Recent Labs: 07/09/2018: ALT 29;  BUN 15; Creatinine, Ser 0.95; Hemoglobin 15.0; Platelets 161; Potassium 4.9; Sodium 140; TSH 2.200  Recent Lipid Panel    Component Value Date/Time   CHOL 160 07/09/2018 1104   TRIG 154 (H) 07/09/2018 1104   HDL 51 07/09/2018 1104   CHOLHDL 3.1 07/09/2018 1104   CHOLHDL 3.0 11/17/2015 0804   VLDL 19 11/17/2015 0804   LDLCALC 78 07/09/2018 1104    Physical Exam:    VS:  BP 122/82   Pulse 72   Temp (!) 97.2 F (36.2 C)   Ht 5'  6" (1.676 m)   Wt 221 lb 9.6 oz (100.5 kg)   SpO2 98%   BMI 35.77 kg/m     Wt Readings from Last 3 Encounters:  04/17/19 221 lb 9.6 oz (100.5 kg)  10/10/18 220 lb (99.8 kg)  07/09/18 213 lb 12.8 oz (97 kg)     GEN:  Well nourished, well developed in no acute distress HEENT: Normal NECK: No JVD; No carotid bruits LYMPHATICS: No lymphadenopathy CARDIAC: RRR, no murmurs, rubs, gallops RESPIRATORY:  Clear to auscultation without rales, wheezing or rhonchi  ABDOMEN: Soft, non-tender, non-distended MUSCULOSKELETAL:  No edema; No deformity  SKIN: Warm and dry NEUROLOGIC:  Alert and oriented x 3 PSYCHIATRIC:  Normal affect   ASSESSMENT:    1. Coronary artery disease involving native coronary artery of native heart without angina pectoris   2. Ischemic cardiomyopathy   3. Essential hypertension   4. Mixed hyperlipidemia    PLAN:    In order of problems listed above:  1. CAD: Last PCI was in 2016.  Most recent Myoview was obtained in 2019.  Patient has been able to walk more than a mile without any exertional chest pain or shortness of breath.  He is on the low-dose maintenance Brilinta 60 mg twice daily along with 80 mg daily of aspirin.  We will continue on the current dose.  2. Ischemic cardiomyopathy: Although his ejection fraction was initially 35% right after NSTEMI in 2016, however EF improved to 60 to 65% by January 2017  3. Hypertension: Blood pressure stable on current therapy  4. Hyperlipidemia: Continue Lipitor, lipid panel was  recently obtained in February by his PCP.  We will request for lab results.   Medication Adjustments/Labs and Tests Ordered: Current medicines are reviewed at length with the patient today.  Concerns regarding medicines are outlined above.  No orders of the defined types were placed in this encounter.  No orders of the defined types were placed in this encounter.   Patient Instructions  Medication Instructions:  Your physician recommends that you continue on your current medications as directed. Please refer to the Current Medication list given to you today.  *If you need a refill on your cardiac medications before your next appointment, please call your pharmacy*  Lab Work: NONE ordered at this time of appointment   If you have labs (blood work) drawn today and your tests are completely normal, you will receive your results only by: Marland Kitchen MyChart Message (if you have MyChart) OR . A paper copy in the mail If you have any lab test that is abnormal or we need to change your treatment, we will call you to review the results.  Testing/Procedures: NONE ordered at this time of appointment   Follow-Up: At [facility], you and your health needs are our priority.  As part of our continuing mission to provide you with exceptional heart care, we have created designated Provider Care Teams.  These Care Teams include your primary Cardiologist (physician) and Advanced Practice Providers (APPs -  Physician Assistants and Nurse Practitioners) who all work together to provide you with the care you need, when you need it.  Your next appointment:   6 month(s)  The format for your next appointment:   In Person  Provider:   Shelva Majestic, MD  Other Instructions      Signed, Almyra Deforest, [place]  04/17/2019 8:47 AM    [place]

## 2019-04-17 NOTE — Patient Instructions (Signed)
Medication Instructions:  °Your physician recommends that you continue on your current medications as directed. Please refer to the Current Medication list given to you today. ° °*If you need a refill on your cardiac medications before your next appointment, please call your pharmacy* ° °Lab Work: °NONE ordered at this time of appointment  ° °If you have labs (blood work) drawn today and your tests are completely normal, you will receive your results only by: °MyChart Message (if you have MyChart) OR °A paper copy in the mail °If you have any lab test that is abnormal or we need to change your treatment, we will call you to review the results. ° °Testing/Procedures: °NONE ordered at this time of appointment  ° °Follow-Up: °At [facility], you and your health needs are our priority.  As part of our continuing mission to provide you with exceptional heart care, we have created designated Provider Care Teams.  These Care Teams include your primary Cardiologist (physician) and Advanced Practice Providers (APPs -  Physician Assistants and Nurse Practitioners) who all work together to provide you with the care you need, when you need it. ° °Your next appointment:   °6 month(s) ° °The format for your next appointment:   °In Person ° °Provider:   °Thomas Kelly, MD   ° °Other Instructions ° ° °

## 2019-06-22 ENCOUNTER — Ambulatory Visit: Payer: 59

## 2019-06-25 ENCOUNTER — Other Ambulatory Visit: Payer: Self-pay | Admitting: Cardiovascular Disease

## 2019-06-26 ENCOUNTER — Ambulatory Visit: Payer: 59

## 2019-06-26 ENCOUNTER — Other Ambulatory Visit: Payer: Self-pay

## 2019-06-26 ENCOUNTER — Other Ambulatory Visit: Payer: Self-pay | Admitting: Cardiovascular Disease

## 2019-06-26 ENCOUNTER — Ambulatory Visit
Admission: RE | Admit: 2019-06-26 | Discharge: 2019-06-26 | Disposition: A | Discharge status: Home / Self Care | Payer: 59 | Source: Ambulatory Visit | Attending: Internal Medicine | Admitting: Internal Medicine

## 2019-06-26 DIAGNOSIS — Z87891 Personal history of nicotine dependence: Secondary | ICD-10-CM

## 2019-07-01 ENCOUNTER — Other Ambulatory Visit: Payer: Self-pay | Admitting: Internal Medicine

## 2019-08-04 ENCOUNTER — Other Ambulatory Visit: Payer: Self-pay | Admitting: Cardiovascular Disease

## 2019-08-07 ENCOUNTER — Other Ambulatory Visit: Payer: Self-pay | Admitting: Cardiovascular Disease

## 2019-08-15 ENCOUNTER — Other Ambulatory Visit: Payer: Self-pay | Admitting: Cardiovascular Disease

## 2019-08-18 ENCOUNTER — Other Ambulatory Visit: Payer: Self-pay | Admitting: Internal Medicine

## 2019-08-18 DIAGNOSIS — Z87891 Personal history of nicotine dependence: Secondary | ICD-10-CM

## 2019-11-27 ENCOUNTER — Other Ambulatory Visit: Payer: Self-pay

## 2019-11-27 ENCOUNTER — Ambulatory Visit (INDEPENDENT_AMBULATORY_CARE_PROVIDER_SITE_OTHER): Payer: 59 | Admitting: Cardiovascular Disease

## 2019-11-27 ENCOUNTER — Encounter: Payer: Self-pay | Admitting: Cardiovascular Disease

## 2019-11-27 DIAGNOSIS — G4733 Obstructive sleep apnea (adult) (pediatric): Secondary | ICD-10-CM

## 2019-11-27 DIAGNOSIS — E782 Mixed hyperlipidemia: Secondary | ICD-10-CM | POA: Diagnosis not present

## 2019-11-27 DIAGNOSIS — I255 Ischemic cardiomyopathy: Secondary | ICD-10-CM

## 2019-11-27 DIAGNOSIS — I2510 Atherosclerotic heart disease of native coronary artery without angina pectoris: Secondary | ICD-10-CM | POA: Diagnosis not present

## 2019-11-27 DIAGNOSIS — I10 Essential (primary) hypertension: Secondary | ICD-10-CM | POA: Diagnosis not present

## 2019-11-27 DIAGNOSIS — K219 Gastro-esophageal reflux disease without esophagitis: Secondary | ICD-10-CM

## 2019-11-27 NOTE — Patient Instructions (Signed)
Medication Instructions:  The current medical regimen is effective;  continue present plan and medications as directed. Please refer to the Current Medication list given to you today. *If you need a refill on your cardiac medications before your next appointment, please call your pharmacy*  Lab Work:   Testing/Procedures:  NONE    NONE  Follow-Up: Your next appointment:  12 month(s) In Person with Shelva Majestic, MD  Please call our office 2 months in advance to schedule this appointment   At [facility], you and your health needs are our priority.  As part of our continuing mission to provide you with exceptional heart care, we have created designated Provider Care Teams.  These Care Teams include your primary Cardiologist (physician) and Advanced Practice Providers (APPs -  Physician Assistants and Nurse Practitioners) who all work together to provide you with the care you need, when you need it.  We recommend signing up for the patient portal called "MyChart".  Sign up information is provided on this After Visit Summary.  MyChart is used to connect with patients for Virtual Visits (Telemedicine).  Patients are able to view lab/test results, encounter notes, upcoming appointments, etc.  Non-urgent messages can be sent to your provider as well.   To learn more about what you can do with MyChart, go to [email or web address].
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
## 2019-11-28 ENCOUNTER — Encounter: Payer: Self-pay | Admitting: Cardiovascular Disease

## 2019-11-29 ENCOUNTER — Other Ambulatory Visit: Payer: Self-pay | Admitting: Cardiovascular Disease

## 2020-01-22 ENCOUNTER — Other Ambulatory Visit: Payer: 59

## 2020-02-01 ENCOUNTER — Other Ambulatory Visit: Payer: Self-pay | Admitting: Cardiovascular Disease

## 2020-02-10 ENCOUNTER — Other Ambulatory Visit: Payer: Self-pay | Admitting: Cardiovascular Disease

## 2020-03-25 ENCOUNTER — Other Ambulatory Visit: Payer: Self-pay | Admitting: Cardiovascular Disease

## 2020-03-25 DIAGNOSIS — E7800 Pure hypercholesterolemia, unspecified: Secondary | ICD-10-CM

## 2020-08-03 ENCOUNTER — Other Ambulatory Visit: Payer: Self-pay | Admitting: Cardiovascular Disease

## 2020-11-10 ENCOUNTER — Telehealth: Payer: Self-pay

## 2020-11-10 NOTE — Telephone Encounter (Signed)
Dr. Claiborne Billings Pt had NSTEMI and PCI to LAD in 2016 with nonischemic nuclear stress tests in 2017 and 2019. He remains on DAPT. We are asked to hold brilinta for colonoscopy.   OK to hold brilinta? Does he need to resume this?

## 2020-11-10 NOTE — Telephone Encounter (Signed)
   [facility] Pre-operative Risk Assessment    Patient Name: Vincent Flores  DOB: Oct 04, 1950 MRN: [id number]  HEARTCARE STAFF:  - IMPORTANT!!!!!! Under Visit Info/Reason for Call, type in Other and utilize the format Clearance MM/DD/YY or Clearance TBD. Do not use dashes or single digits. - Please review there is not already an duplicate clearance open for this procedure. - If request is for dental extraction, please clarify the # of teeth to be extracted. - If the patient is currently at the dentist's office, call Pre-Op Callback Staff (MA/nurse) to input urgent request.  - If the patient is not currently in the dentist office, please route to the Pre-Op pool.  Request for surgical clearance:  What type of surgery is being performed? Colonoscopy with Propofol  When is this surgery scheduled? 11/24/2020  What type of clearance is required (medical clearance vs. Pharmacy clearance to hold med vs. Both)? Pharmacy  Are there any medications that need to be held prior to surgery and how long? [place] name and name of physician performing surgery? [facility], [place] w/Dr. Benson Norway  What is the office phone number? [phone]   7.   What is the office fax number? [phone]  8.   Anesthesia type (None, local, MAC, general) ? Unknown    Vincent Flores 11/10/2020, 2:47 PM  _________________________________________________________________   (provider comments below)

## 2020-11-18 NOTE — Telephone Encounter (Signed)
Ok to hold for 5 days for colonoscpy; will discuss at upcoming ov

## 2020-11-22 NOTE — Telephone Encounter (Signed)
Notes faxed to surgeon. This phone note will be removed from the preop pool. Richardson Dopp, PA-C  11/22/2020 8:19 AM

## 2020-11-22 NOTE — Telephone Encounter (Signed)
   Name: Dwon Sky DOB: [birth year]  MRN: [id number]  Primary Cardiologist: Shelva Majestic, MD  Chart reviewed as part of pre-operative protocol coverage.   See recommendations from Dr. Claiborne Billings below.  Recommendations: Ticagrelor (Brilinta) can be held for 5 days prior to the procedure and resumed post op when felt to be safe.  Please call with questions. Richardson Dopp, PA-C 11/22/2020, 8:16 AM

## 2020-11-30 ENCOUNTER — Other Ambulatory Visit: Payer: Self-pay | Admitting: Cardiovascular Disease

## 2020-12-17 ENCOUNTER — Other Ambulatory Visit: Payer: Self-pay | Admitting: Cardiovascular Disease

## 2020-12-22 ENCOUNTER — Encounter: Payer: Self-pay | Admitting: Cardiovascular Disease

## 2020-12-22 ENCOUNTER — Ambulatory Visit (INDEPENDENT_AMBULATORY_CARE_PROVIDER_SITE_OTHER): Payer: 59 | Admitting: Cardiovascular Disease

## 2020-12-22 ENCOUNTER — Other Ambulatory Visit: Payer: Self-pay

## 2020-12-22 VITALS — BP 126/70 | HR 68 | Ht 66.0 in | Wt 213.0 lb

## 2020-12-22 DIAGNOSIS — I2510 Atherosclerotic heart disease of native coronary artery without angina pectoris: Secondary | ICD-10-CM

## 2020-12-22 DIAGNOSIS — G4733 Obstructive sleep apnea (adult) (pediatric): Secondary | ICD-10-CM | POA: Diagnosis not present

## 2020-12-22 DIAGNOSIS — E782 Mixed hyperlipidemia: Secondary | ICD-10-CM

## 2020-12-22 DIAGNOSIS — I10 Essential (primary) hypertension: Secondary | ICD-10-CM | POA: Diagnosis not present

## 2020-12-22 MED ORDER — CLOPIDOGREL BISULFATE 75 MG PO TABS: 75.0000 mg | ORAL_TABLET | Freq: Every day | ORAL | 3 refills | Status: DC
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
## 2020-12-22 NOTE — Patient Instructions (Signed)
Medication Instructions:   -When you have about 1 week left of Brilinta, let our office know and we will send in clopidogrel (plavix) 75mg  once daily.  Your physician recommends that you continue on your current medications as directed. Please refer to the Current Medication list given to you today.  *If you need a refill on your cardiac medications before your next appointment, please call your pharmacy*   Follow-Up: At [facility], you and your health needs are our priority.  As part of our continuing mission to provide you with exceptional heart care, we have created designated Provider Care Teams.  These Care Teams include your primary Cardiologist (physician) and Advanced Practice Providers (APPs -  Physician Assistants and Nurse Practitioners) who all work together to provide you with the care you need, when you need it.  We recommend signing up for the patient portal called "MyChart".  Sign up information is provided on this After Visit Summary.  MyChart is used to connect with patients for Virtual Visits (Telemedicine).  Patients are able to view lab/test results, encounter notes, upcoming appointments, etc.  Non-urgent messages can be sent to your provider as well.   To learn more about what you can do with MyChart, go to [email or web address].    Your next appointment:   12 month(s)  The format for your next appointment:   In Person  Provider:   Shelva Majestic, MD

## 2020-12-28 ENCOUNTER — Encounter: Payer: Self-pay | Admitting: Cardiovascular Disease

## 2021-01-30 ENCOUNTER — Other Ambulatory Visit: Payer: Self-pay | Admitting: Cardiovascular Disease

## 2021-02-27 ENCOUNTER — Other Ambulatory Visit: Payer: Self-pay | Admitting: Cardiovascular Disease

## 2021-03-04 ENCOUNTER — Other Ambulatory Visit: Payer: Self-pay | Admitting: Cardiovascular Disease

## 2021-03-04 DIAGNOSIS — E7800 Pure hypercholesterolemia, unspecified: Secondary | ICD-10-CM

## 2021-03-14 ENCOUNTER — Telehealth: Payer: Self-pay | Admitting: Cardiovascular Disease

## 2021-03-14 MED ORDER — CLOPIDOGREL BISULFATE 75 MG PO TABS: 75.0000 mg | ORAL_TABLET | Freq: Every day | ORAL | 3 refills | Status: DC

## 2021-03-14 NOTE — Telephone Encounter (Signed)
Rx sent to requested pharmacy. Pt notified he will await pharmacy call ?

## 2021-03-14 NOTE — Telephone Encounter (Signed)
Pt c/o medication issue: ? ?1. Name of Medication:  ? BRILINTA 60 MG TABS tablet  ?clopidogrel (PLAVIX) 75 MG tablet ? ?2. How are you currently taking this medication (dosage and times per day)? Brilinta 1 tablet twice a day, plavix has not started ? ?3. Are you having a reaction (difficulty breathing--STAT)? no ? ?4. What is your medication issue? Patient states at his last appointment Dr. Claiborne Billings wanted to switch him from Brilinta to Plavix. He says he was told to call the office when he had a week left of the Brilinta so the Plavix can be sent to his pharmacy. He says [facility] should go to [facility] on [address].  ?

## 2021-03-30 DIAGNOSIS — G4733 Obstructive sleep apnea (adult) (pediatric): Secondary | ICD-10-CM | POA: Diagnosis not present

## 2021-04-03 DIAGNOSIS — R0781 Pleurodynia: Secondary | ICD-10-CM | POA: Diagnosis not present

## 2021-04-03 DIAGNOSIS — W19XXXA Unspecified fall, initial encounter: Secondary | ICD-10-CM | POA: Diagnosis not present

## 2021-04-06 DIAGNOSIS — W19XXXA Unspecified fall, initial encounter: Secondary | ICD-10-CM | POA: Diagnosis not present

## 2021-04-06 DIAGNOSIS — R0781 Pleurodynia: Secondary | ICD-10-CM | POA: Diagnosis not present

## 2021-04-21 DIAGNOSIS — Z125 Encounter for screening for malignant neoplasm of prostate: Secondary | ICD-10-CM | POA: Diagnosis not present

## 2021-04-21 DIAGNOSIS — R5383 Other fatigue: Secondary | ICD-10-CM | POA: Diagnosis not present

## 2021-04-21 DIAGNOSIS — R39198 Other difficulties with micturition: Secondary | ICD-10-CM | POA: Diagnosis not present

## 2021-04-21 DIAGNOSIS — I2510 Atherosclerotic heart disease of native coronary artery without angina pectoris: Secondary | ICD-10-CM | POA: Diagnosis not present

## 2021-04-21 DIAGNOSIS — E782 Mixed hyperlipidemia: Secondary | ICD-10-CM | POA: Diagnosis not present

## 2021-04-21 DIAGNOSIS — R7303 Prediabetes: Secondary | ICD-10-CM | POA: Diagnosis not present

## 2021-04-28 ENCOUNTER — Other Ambulatory Visit: Payer: Self-pay | Admitting: Internal Medicine

## 2021-04-28 DIAGNOSIS — R7303 Prediabetes: Secondary | ICD-10-CM | POA: Diagnosis not present

## 2021-04-28 DIAGNOSIS — Z23 Encounter for immunization: Secondary | ICD-10-CM | POA: Diagnosis not present

## 2021-04-28 DIAGNOSIS — M79641 Pain in right hand: Secondary | ICD-10-CM | POA: Diagnosis not present

## 2021-04-28 DIAGNOSIS — Z0000 Encounter for general adult medical examination without abnormal findings: Secondary | ICD-10-CM | POA: Diagnosis not present

## 2021-04-28 DIAGNOSIS — R911 Solitary pulmonary nodule: Secondary | ICD-10-CM | POA: Diagnosis not present

## 2021-04-28 DIAGNOSIS — J432 Centrilobular emphysema: Secondary | ICD-10-CM | POA: Diagnosis not present

## 2021-04-28 DIAGNOSIS — I700 Atherosclerosis of aorta: Secondary | ICD-10-CM | POA: Diagnosis not present

## 2021-04-28 DIAGNOSIS — I2510 Atherosclerotic heart disease of native coronary artery without angina pectoris: Secondary | ICD-10-CM | POA: Diagnosis not present

## 2021-04-28 DIAGNOSIS — E782 Mixed hyperlipidemia: Secondary | ICD-10-CM | POA: Diagnosis not present

## 2021-05-03 ENCOUNTER — Ambulatory Visit
Admission: RE | Admit: 2021-05-03 | Discharge: 2021-05-03 | Disposition: A | Discharge status: Home / Self Care | Payer: HMO | Source: Ambulatory Visit | Attending: Internal Medicine | Admitting: Internal Medicine

## 2021-05-03 DIAGNOSIS — R911 Solitary pulmonary nodule: Secondary | ICD-10-CM

## 2021-05-03 DIAGNOSIS — J439 Emphysema, unspecified: Secondary | ICD-10-CM | POA: Diagnosis not present

## 2021-05-08 DIAGNOSIS — M1990 Unspecified osteoarthritis, unspecified site: Secondary | ICD-10-CM | POA: Diagnosis not present

## 2021-05-08 DIAGNOSIS — M542 Cervicalgia: Secondary | ICD-10-CM | POA: Diagnosis not present

## 2021-05-08 DIAGNOSIS — M6530 Trigger finger, unspecified finger: Secondary | ICD-10-CM | POA: Diagnosis not present

## 2021-05-08 DIAGNOSIS — M79642 Pain in left hand: Secondary | ICD-10-CM | POA: Diagnosis not present

## 2021-05-08 DIAGNOSIS — M79641 Pain in right hand: Secondary | ICD-10-CM | POA: Diagnosis not present

## 2021-05-08 DIAGNOSIS — M79643 Pain in unspecified hand: Secondary | ICD-10-CM | POA: Diagnosis not present

## 2021-08-19 ENCOUNTER — Other Ambulatory Visit: Payer: Self-pay | Admitting: Cardiovascular Disease

## 2021-09-03 ENCOUNTER — Other Ambulatory Visit: Payer: Self-pay | Admitting: Cardiovascular Disease

## 2021-11-15 DIAGNOSIS — R7303 Prediabetes: Secondary | ICD-10-CM | POA: Diagnosis not present

## 2021-11-15 DIAGNOSIS — E782 Mixed hyperlipidemia: Secondary | ICD-10-CM | POA: Diagnosis not present

## 2021-11-15 DIAGNOSIS — I2510 Atherosclerotic heart disease of native coronary artery without angina pectoris: Secondary | ICD-10-CM | POA: Diagnosis not present

## 2021-11-22 DIAGNOSIS — I700 Atherosclerosis of aorta: Secondary | ICD-10-CM | POA: Diagnosis not present

## 2021-11-22 DIAGNOSIS — R911 Solitary pulmonary nodule: Secondary | ICD-10-CM | POA: Diagnosis not present

## 2021-11-22 DIAGNOSIS — Z87891 Personal history of nicotine dependence: Secondary | ICD-10-CM | POA: Diagnosis not present

## 2021-11-22 DIAGNOSIS — J432 Centrilobular emphysema: Secondary | ICD-10-CM | POA: Diagnosis not present

## 2021-11-22 DIAGNOSIS — R7303 Prediabetes: Secondary | ICD-10-CM | POA: Diagnosis not present

## 2021-11-22 DIAGNOSIS — E782 Mixed hyperlipidemia: Secondary | ICD-10-CM | POA: Diagnosis not present

## 2021-11-22 DIAGNOSIS — K429 Umbilical hernia without obstruction or gangrene: Secondary | ICD-10-CM | POA: Diagnosis not present

## 2021-11-22 DIAGNOSIS — I2510 Atherosclerotic heart disease of native coronary artery without angina pectoris: Secondary | ICD-10-CM | POA: Diagnosis not present

## 2021-12-05 ENCOUNTER — Telehealth: Payer: Self-pay | Admitting: Cardiovascular Disease

## 2021-12-05 NOTE — Telephone Encounter (Signed)
Patient states [facility] informed him that they are no longer supplying CPAP machines and he would like to have an order for a new CPAP machine sent to [facility].

## 2021-12-05 NOTE — Telephone Encounter (Signed)
Left detailed message to call [facility] and ask for [facility].

## 2022-01-09 DIAGNOSIS — M545 Low back pain, unspecified: Secondary | ICD-10-CM | POA: Diagnosis not present

## 2022-01-24 DIAGNOSIS — M545 Low back pain, unspecified: Secondary | ICD-10-CM | POA: Diagnosis not present

## 2022-01-30 ENCOUNTER — Other Ambulatory Visit: Payer: Self-pay | Admitting: Cardiovascular Disease

## 2022-02-18 ENCOUNTER — Other Ambulatory Visit: Payer: Self-pay | Admitting: Cardiovascular Disease

## 2022-02-18 DIAGNOSIS — E7800 Pure hypercholesterolemia, unspecified: Secondary | ICD-10-CM

## 2022-03-01 ENCOUNTER — Other Ambulatory Visit: Payer: Self-pay | Admitting: Cardiovascular Disease

## 2022-03-09 ENCOUNTER — Other Ambulatory Visit (HOSPITAL_COMMUNITY): Payer: Self-pay | Admitting: Internal Medicine

## 2022-03-09 DIAGNOSIS — Z87891 Personal history of nicotine dependence: Secondary | ICD-10-CM

## 2022-03-28 ENCOUNTER — Ambulatory Visit (HOSPITAL_COMMUNITY)
Admission: RE | Admit: 2022-03-28 | Discharge: 2022-03-28 | Disposition: A | Discharge status: Home / Self Care | Payer: PPO | Source: Ambulatory Visit | Attending: Internal Medicine | Admitting: Internal Medicine

## 2022-03-28 DIAGNOSIS — Z87891 Personal history of nicotine dependence: Secondary | ICD-10-CM | POA: Diagnosis not present

## 2022-04-05 ENCOUNTER — Ambulatory Visit: Payer: PPO | Attending: Cardiovascular Disease | Admitting: Cardiovascular Disease

## 2022-04-05 ENCOUNTER — Encounter: Payer: Self-pay | Admitting: Cardiovascular Disease

## 2022-04-05 DIAGNOSIS — K219 Gastro-esophageal reflux disease without esophagitis: Secondary | ICD-10-CM | POA: Diagnosis not present

## 2022-04-05 DIAGNOSIS — I10 Essential (primary) hypertension: Secondary | ICD-10-CM

## 2022-04-05 DIAGNOSIS — I2510 Atherosclerotic heart disease of native coronary artery without angina pectoris: Secondary | ICD-10-CM

## 2022-04-05 DIAGNOSIS — E782 Mixed hyperlipidemia: Secondary | ICD-10-CM | POA: Diagnosis not present

## 2022-04-05 DIAGNOSIS — G4733 Obstructive sleep apnea (adult) (pediatric): Secondary | ICD-10-CM | POA: Diagnosis not present
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
## 2022-04-05 NOTE — Patient Instructions (Signed)
Medication Instructions:   No changes  *If you need a refill on your cardiac medications before your next appointment, please call your pharmacy*   Lab Work:  Have your primary provider to check for LPa  the next time you have labs drawn  If you have labs (blood work) drawn today and your tests are completely normal, you will receive your results only by: [facility] (if you have MyChart) OR A paper copy in the mail If you have any lab test that is abnormal or we need to change your treatment, we will call you to review the results.   Testing/Procedures:  Not needed   Follow-Up: At [facility], you and your health needs are our priority.  As part of our continuing mission to provide you with exceptional heart care, we have created designated Provider Care Teams.  These Care Teams include your primary Cardiologist (physician) and Advanced Practice Providers (APPs -  Physician Assistants and Nurse Practitioners) who all work together to provide you with the care you need, when you need it.     Your next appointment:   12 month(s) Sleeep   The format for your next appointment:   In Person  Provider:   Shelva Majestic, MD    Other Instructions  No pressure changes were done to your C-PAP

## 2022-04-10 ENCOUNTER — Encounter: Payer: Self-pay | Admitting: Cardiovascular Disease

## 2022-05-23 DIAGNOSIS — Z0000 Encounter for general adult medical examination without abnormal findings: Secondary | ICD-10-CM | POA: Diagnosis not present

## 2022-05-23 DIAGNOSIS — I2510 Atherosclerotic heart disease of native coronary artery without angina pectoris: Secondary | ICD-10-CM | POA: Diagnosis not present

## 2022-05-23 DIAGNOSIS — E782 Mixed hyperlipidemia: Secondary | ICD-10-CM | POA: Diagnosis not present

## 2022-05-23 DIAGNOSIS — R7303 Prediabetes: Secondary | ICD-10-CM | POA: Diagnosis not present

## 2022-05-23 DIAGNOSIS — J432 Centrilobular emphysema: Secondary | ICD-10-CM | POA: Diagnosis not present

## 2022-05-23 DIAGNOSIS — R5383 Other fatigue: Secondary | ICD-10-CM | POA: Diagnosis not present

## 2022-05-23 DIAGNOSIS — R911 Solitary pulmonary nodule: Secondary | ICD-10-CM | POA: Diagnosis not present

## 2022-05-23 DIAGNOSIS — I700 Atherosclerosis of aorta: Secondary | ICD-10-CM | POA: Diagnosis not present

## 2022-05-29 ENCOUNTER — Other Ambulatory Visit: Payer: Self-pay | Admitting: Cardiovascular Disease

## 2022-05-30 DIAGNOSIS — E782 Mixed hyperlipidemia: Secondary | ICD-10-CM | POA: Diagnosis not present

## 2022-05-30 DIAGNOSIS — L812 Freckles: Secondary | ICD-10-CM | POA: Diagnosis not present

## 2022-05-30 DIAGNOSIS — I2510 Atherosclerotic heart disease of native coronary artery without angina pectoris: Secondary | ICD-10-CM | POA: Diagnosis not present

## 2022-05-30 DIAGNOSIS — Z0000 Encounter for general adult medical examination without abnormal findings: Secondary | ICD-10-CM | POA: Diagnosis not present

## 2022-05-30 DIAGNOSIS — I700 Atherosclerosis of aorta: Secondary | ICD-10-CM | POA: Diagnosis not present

## 2022-05-30 DIAGNOSIS — N182 Chronic kidney disease, stage 2 (mild): Secondary | ICD-10-CM | POA: Diagnosis not present

## 2022-05-30 DIAGNOSIS — J432 Centrilobular emphysema: Secondary | ICD-10-CM | POA: Diagnosis not present

## 2022-05-30 DIAGNOSIS — R7303 Prediabetes: Secondary | ICD-10-CM | POA: Diagnosis not present

## 2022-05-30 DIAGNOSIS — R911 Solitary pulmonary nodule: Secondary | ICD-10-CM | POA: Diagnosis not present

## 2022-06-16 ENCOUNTER — Other Ambulatory Visit: Payer: Self-pay | Admitting: Cardiovascular Disease

## 2022-08-19 ENCOUNTER — Other Ambulatory Visit: Payer: Self-pay | Admitting: Cardiovascular Disease

## 2022-08-19 DIAGNOSIS — E7800 Pure hypercholesterolemia, unspecified: Secondary | ICD-10-CM

## 2022-08-20 ENCOUNTER — Emergency Department (HOSPITAL_COMMUNITY)
Admission: EM | Admit: 2022-08-20 | Discharge: 2022-08-20 | Disposition: A | Discharge status: Home / Self Care | Payer: PPO | Attending: Emergency Medicine | Admitting: Emergency Medicine

## 2022-08-20 ENCOUNTER — Ambulatory Visit (HOSPITAL_COMMUNITY)
Admission: EM | Admit: 2022-08-20 | Discharge: 2022-08-20 | Disposition: A | Discharge status: Home / Self Care | Payer: PPO | Attending: Internal Medicine | Admitting: Internal Medicine

## 2022-08-20 ENCOUNTER — Other Ambulatory Visit: Payer: Self-pay

## 2022-08-20 ENCOUNTER — Encounter (HOSPITAL_COMMUNITY): Payer: Self-pay

## 2022-08-20 ENCOUNTER — Emergency Department (HOSPITAL_COMMUNITY): Payer: PPO

## 2022-08-20 DIAGNOSIS — R0789 Other chest pain: Secondary | ICD-10-CM | POA: Insufficient documentation

## 2022-08-20 DIAGNOSIS — R079 Chest pain, unspecified: Secondary | ICD-10-CM | POA: Diagnosis not present

## 2022-08-20 DIAGNOSIS — I2510 Atherosclerotic heart disease of native coronary artery without angina pectoris: Secondary | ICD-10-CM | POA: Insufficient documentation

## 2022-08-20 DIAGNOSIS — I252 Old myocardial infarction: Secondary | ICD-10-CM

## 2022-08-20 DIAGNOSIS — I771 Stricture of artery: Secondary | ICD-10-CM | POA: Diagnosis not present

## 2022-08-20 DIAGNOSIS — I77819 Aortic ectasia, unspecified site: Secondary | ICD-10-CM | POA: Diagnosis not present

## 2022-08-20 DIAGNOSIS — K439 Ventral hernia without obstruction or gangrene: Secondary | ICD-10-CM | POA: Insufficient documentation

## 2022-08-20 DIAGNOSIS — Z7982 Long term (current) use of aspirin: Secondary | ICD-10-CM | POA: Insufficient documentation

## 2022-08-20 DIAGNOSIS — Z8679 Personal history of other diseases of the circulatory system: Secondary | ICD-10-CM

## 2022-08-20 DIAGNOSIS — Z7902 Long term (current) use of antithrombotics/antiplatelets: Secondary | ICD-10-CM | POA: Diagnosis not present

## 2022-08-20 LAB — BASIC METABOLIC PANEL
Anion gap: 13 (ref 5–15)
BUN: 11 mg/dL (ref 8–23)
CO2: 24 mmol/L (ref 22–32)
Calcium: 9.4 mg/dL (ref 8.9–10.3)
Chloride: 101 mmol/L (ref 98–111)
Creatinine, Ser: 0.94 mg/dL (ref 0.61–1.24)
GFR, Estimated: >60 mL/min (ref >60)
Glucose, Bld: 154 mg/dL — ABNORMAL HIGH (ref 70–99)
Potassium: 3.7 mmol/L (ref 3.5–5.1)
Sodium: 138 mmol/L (ref 135–145)

## 2022-08-20 LAB — CBC
HCT: 41.9 % (ref 39.0–52.0)
Hemoglobin: 14.5 g/dL (ref 13.0–17.0)
MCH: 34.4 pg — ABNORMAL HIGH (ref 26.0–34.0)
MCHC: 34.6 g/dL (ref 30.0–36.0)
MCV: 99.3 fL (ref 80.0–100.0)
Platelets: 141 10*3/uL — ABNORMAL LOW (ref 150–400)
RBC: 4.22 MIL/uL (ref 4.22–5.81)
RDW: 12.3 % (ref 11.5–15.5)
WBC: 11.3 10*3/uL — ABNORMAL HIGH (ref 4.0–10.5)
nRBC: 0 % (ref 0.0–0.2)

## 2022-08-20 LAB — TROPONIN I (HIGH SENSITIVITY): Troponin I (High Sensitivity): 5 ng/L (ref <18)

## 2022-08-20 NOTE — ED Provider Notes (Signed)
[place] EMERGENCY DEPARTMENT AT [facility] Provider Note   CSN: [id number] Arrival date & time: 08/20/22  1413     History  Chief Complaint  Patient presents with   Chest Pain    Vincent Flores is a 72 y.o. male.  Patient presents for assessment of chest pain that started last night has been fairly persistent.  Centralized.  Patient has history of CAD.  Patient has cardiology she follows up with.  Patient did not take nitro.  No shortness of breath no recent surgeries no diaphoresis no nausea or vomiting.  Patient has a hernia as well that causes intermittent chest discomfort.       Home Medications Prior to Admission medications   Medication Sig Start Date End Date Taking? Authorizing Provider  aspirin EC 81 MG tablet Take 81 mg by mouth daily.    [provider]  atorvastatin (LIPITOR) 80 MG tablet Take 80 mg by mouth daily. 11/22/21   [provider]  carvedilol (COREG) 6.25 MG tablet TAKE 1 TABLET BY MOUTH 2 TIMES DAILY WITH A MEAL. 09/04/21   Lennette Bihari, MD  clopidogrel (PLAVIX) 75 MG tablet TAKE 1 TABLET BY MOUTH EVERY DAY 05/29/22   Lennette Bihari, MD  diphenhydramine-acetaminophen (TYLENOL PM) 25-500 MG TABS tablet Take 2 tablets by mouth at bedtime as needed (pain / sleep).    [provider]  isosorbide mononitrate (IMDUR) 60 MG 24 hr tablet TAKE 1 TABLET BY MOUTH EVERY DAY 05/29/22   Lennette Bihari, MD  lisinopril (ZESTRIL) 10 MG tablet TAKE 1/2 TABLET BY MOUTH DAILY 08/20/22   Lennette Bihari, MD  nitroGLYCERIN (NITROSTAT) 0.4 MG SL tablet Place 1 tablet (0.4 mg total) under the tongue every 5 (five) minutes x 3 doses as needed for chest pain. 07/22/17   Jodelle Gross, NP  pantoprazole (PROTONIX) 40 MG tablet TAKE 1 TABLET BY MOUTH EVERY DAY 06/18/22   Lennette Bihari, MD      Allergies    Patient has no known allergies.    Review of Systems   Review of Systems  Constitutional:  Negative for chills and fever.  HENT:   Negative for congestion.   Eyes:  Negative for visual disturbance.  Respiratory:  Negative for shortness of breath.   Cardiovascular:  Positive for chest pain.  Gastrointestinal:  Negative for abdominal pain and vomiting.  Genitourinary:  Negative for dysuria and flank pain.  Musculoskeletal:  Negative for back pain, neck pain and neck stiffness.  Skin:  Negative for rash.  Neurological:  Negative for light-headedness and headaches.    Physical Exam Updated Vital Signs BP (!) 158/91   Pulse 73   Temp 98.5 F (36.9 C) (Oral)   Resp 16   SpO2 99%  Physical Exam Vitals and nursing note reviewed.  Constitutional:      General: He is not in acute distress.    Appearance: He is well-developed.  HENT:     Head: Normocephalic and atraumatic.     Mouth/Throat:     Mouth: Mucous membranes are moist.  Eyes:     General:        Right eye: No discharge.        Left eye: No discharge.     Conjunctiva/sclera: Conjunctivae normal.  Neck:     Trachea: No tracheal deviation.  Cardiovascular:     Rate and Rhythm: Normal rate and regular rhythm.  Pulmonary:     Effort: Pulmonary effort is normal.  Breath sounds: Normal breath sounds.  Abdominal:     General: There is no distension.     Palpations: Abdomen is soft.     Tenderness: There is no abdominal tenderness. There is no guarding.     Comments: Patient has ventral hernia nontender  Musculoskeletal:     Cervical back: Normal range of motion and neck supple. No rigidity.  Skin:    General: Skin is warm.     Capillary Refill: Capillary refill takes less than 2 seconds.     Findings: No rash.  Neurological:     General: No focal deficit present.     Mental Status: He is alert.     Cranial Nerves: No cranial nerve deficit.  Psychiatric:        Mood and Affect: Mood normal.     ED Results / Procedures / Treatments   Labs (all labs ordered are listed, but only abnormal results are displayed) Labs Reviewed  BASIC METABOLIC  PANEL - Abnormal; Notable for the following components:      Result Value   Glucose, Bld 154 (*)    All other components within normal limits  CBC - Abnormal; Notable for the following components:   WBC 11.3 (*)    MCH 34.4 (*)    Platelets 141 (*)    All other components within normal limits  TROPONIN I (HIGH SENSITIVITY)  TROPONIN I (HIGH SENSITIVITY)    EKG EKG Interpretation Date/Time:  Monday August 20 2022 14:07:23 EDT Ventricular Rate:  74 PR Interval:  138 QRS Duration:  88 QT Interval:  362 QTC Calculation: 401 R Axis:   68  Text Interpretation: Normal sinus rhythm Abnormal ECG When compared with ECG of 20-Aug-2022 13:34, PREVIOUS ECG IS PRESENT Confirmed by Blane Ohara [phone]) on 08/20/2022 9:37:11 PM  Radiology DG Chest 2 View  Result Date: 08/20/2022 CLINICAL DATA:  Chest pain EXAM: CHEST - 2 VIEW COMPARISON:  X-ray 07/16/2017.  CT 03/28/2022 FINDINGS: No consolidation, pneumothorax or effusion. Some chronic interstitial lung changes. Normal cardiopericardial silhouette without edema. Degenerative changes are seen along the spine. Tortuous and ectatic aorta. IMPRESSION: No acute cardiopulmonary disease.  Chronic changes Electronically Signed   By: Karen Kays M.D.   On: 08/20/2022 15:29    Procedures Procedures    Medications Ordered in ED Medications - No data to display  ED Course/ Medical Decision Making/ A&P                                 Medical Decision Making Amount and/or Complexity of Data Reviewed Labs: ordered. Radiology: ordered.   Patient with known CAD follows with cardiology closely presents with chest discomfort.  Fortunately EKG shows no ST elevation, troponin negative.  Patient has minimal symptoms on assessment.  Patient requesting discharge and will follow-up with his cardiologist.  Patient understands reasons return.  Other blood work reviewed independently normal hemoglobin electrolytes unremarkable.  Chest x-ray reviewed no acute  abnormalities.        Final Clinical Impression(s) / ED Diagnoses Final diagnoses:  Acute chest pain  History of CAD (coronary artery disease)    Rx / DC Orders ED Discharge Orders     None         Blane Ohara, MD 08/20/22 2152

## 2022-08-20 NOTE — ED Triage Notes (Signed)
Pt came in via POV d/t centralized CP that started last, radiates into Rt shoulder. A/Ox4, rates pain 6/10. Did not take Nitroglycerin at home. Denies SOB or n/v or any other s/s.

## 2022-08-20 NOTE — ED Provider Notes (Signed)
[facility]    CSN: [id number] Arrival date & time: 08/20/22  1313      History   Chief Complaint Chief Complaint  Patient presents with   Chest Pain    HPI Vincent Flores is a 72 y.o. male.   Patient presents to urgent care for evaluation of chest pain to the central chest that started yesterday evening suddenly. Pain has been constant to the central chest since onset, hasn't gotten better and hasn't gotten worse. Pain was there when he woke up this morning. Pain radiates through the body to the upper back and he states this "almost feels like really bad heartburn" but takes pantoprazole and GERD is well managed with this. Describes pain as a "stinging burning ache". Chest pain is currently a 6/10. Nothing makes pain worse or better. Denies change in pain with movement/eating. Pain started before eating pizza last night, states this does not usually trigger any symptoms for him. He also worked out in the yard yesterday with more intense physical activity than normal. No shortness of breath, N/V/D, syncope, or lightheadedness. History of MI in 2016 with stent to the LAD. He regularly sees cardiology (Dr. Tresa Endo). He has not taken nitroglycerin.  Taking Plavix as prescribed.   Chest Pain   Past Medical History:  Diagnosis Date   CAD (coronary artery disease)    a. NSTEMI 10/16: mid LAD 100% >> PCI with Synergy DES   HTN (hypertension)    Hyperlipidemia    Ischemic cardiomyopathy    a. Echo 10/16: Moderate LVH, EF 35-40%, anteroseptal akinesis, grade 1 diastolic;  b. Echo 1/17: mild LVH, EF 60-65%, no RWMA, Gr 1 DD.    Patient Active Problem List   Diagnosis Date Noted   Unstable angina (HCC) 10/15/2015   CAD in native artery, with NSTEMI 10/2015, stent to LAD, residual disease in other vessels 10/15/2015   Hx of myocardial infarction, 10/2014 10/15/2015   Chest pain at rest    Hemorrhoid 05/18/2015   GERD (gastroesophageal reflux disease) 01/15/2015   Essential  hypertension 11/01/2014   Hyperlipidemia 11/01/2014   NSTEMI (non-ST elevated myocardial infarction) (HCC) 10/29/2014   Abnormal EKG 02/14/2012    Past Surgical History:  Procedure Laterality Date   APPENDECTOMY  1967   CARDIAC CATHETERIZATION N/A 10/29/2014   Procedure: Left Heart Cath and Coronary Angiography;  Surgeon: Lyn Records, MD;  Location: [facility] INVASIVE CV LAB;  Service: Cardiovascular;  Laterality: N/A;   CARDIAC CATHETERIZATION N/A 10/29/2014   Procedure: Coronary Stent Intervention;  Surgeon: Lyn Records, MD;  Location: [facility] INVASIVE CV LAB;  Service: Cardiovascular;  Laterality: N/A;   CORONARY STENT PLACEMENT     INGUINAL HERNIA REPAIR     TRANSTHORACIC ECHOCARDIOGRAM  [id number]   normal global wall motion.  normal systolic global function.  calculated EF 61 %       Home Medications    Prior to Admission medications   Medication Sig Start Date End Date Taking? Authorizing Provider  aspirin EC 81 MG tablet Take 81 mg by mouth daily.   Yes [provider]  atorvastatin (LIPITOR) 80 MG tablet Take 80 mg by mouth daily. 11/22/21  Yes [provider]  carvedilol (COREG) 6.25 MG tablet TAKE 1 TABLET BY MOUTH 2 TIMES DAILY WITH A MEAL. 09/04/21  Yes Lennette Bihari, MD  clopidogrel (PLAVIX) 75 MG tablet TAKE 1 TABLET BY MOUTH EVERY DAY 05/29/22  Yes Lennette Bihari, MD  diphenhydramine-acetaminophen (TYLENOL PM) 25-500 MG TABS tablet  Take 2 tablets by mouth at bedtime as needed (pain / sleep).   Yes [provider]  isosorbide mononitrate (IMDUR) 60 MG 24 hr tablet TAKE 1 TABLET BY MOUTH EVERY DAY 05/29/22  Yes Lennette Bihari, MD  pantoprazole (PROTONIX) 40 MG tablet TAKE 1 TABLET BY MOUTH EVERY DAY 06/18/22  Yes Lennette Bihari, MD  lisinopril (ZESTRIL) 10 MG tablet TAKE 1/2 TABLET BY MOUTH DAILY 08/20/22   Lennette Bihari, MD  nitroGLYCERIN (NITROSTAT) 0.4 MG SL tablet Place 1 tablet (0.4 mg total) under the tongue every 5 (five) minutes x 3 doses as  needed for chest pain. 07/22/17   Jodelle Gross, NP    Family History Family History  Problem Relation Age of Onset   Heart disease Mother    Cancer Father        Lung, was a smoker    Social History Social History   Tobacco Use   Smoking status: Former    Current packs/day: 0.00    Types: Cigarettes    Quit date: 01/08/2009    Years since quitting: 13.6   Smokeless tobacco: Never  Substance Use Topics   Alcohol use: Yes    Alcohol/week: 21.0 standard drinks of alcohol    Types: 21 Standard drinks or equivalent per week   Drug use: No     Allergies   Patient has no known allergies.   Review of Systems Review of Systems  Cardiovascular:  Positive for chest pain.  Per HPI   Physical Exam Triage Vital Signs ED Triage Vitals  Encounter Vitals Group     BP 08/20/22 1320 (!) 168/90     Systolic BP Percentile --      Diastolic BP Percentile --      Pulse Rate 08/20/22 1320 68     Resp 08/20/22 1320 16     Temp 08/20/22 1320 98.1 F (36.7 C)     Temp Source 08/20/22 1320 Oral     SpO2 08/20/22 1320 95 %     Weight 08/20/22 1320 220 lb (99.8 kg)     Height 08/20/22 1320 5\' 6"  (1.676 m)     Head Circumference --      Peak Flow --      Pain Score 08/20/22 1319 6     Pain Loc --      Pain Education --      Exclude from Growth Chart --    No data found.  Updated Vital Signs BP (!) 168/90 (BP Location: Right Arm)   Pulse 68   Temp 98.1 F (36.7 C) (Oral)   Resp 16   Ht 5\' 6"  (1.676 m)   Wt 220 lb (99.8 kg)   SpO2 95%   BMI 35.51 kg/m   Visual Acuity Right Eye Distance:   Left Eye Distance:   Bilateral Distance:    Right Eye Near:   Left Eye Near:    Bilateral Near:     Physical Exam Vitals and nursing note reviewed.  Constitutional:      Appearance: He is not ill-appearing or toxic-appearing.  HENT:     Head: Normocephalic and atraumatic.     Right Ear: Hearing and external ear normal.     Left Ear: Hearing and external ear normal.      Nose: Nose normal.     Mouth/Throat:     Lips: Pink.  Eyes:     General: Lids are normal. Vision grossly intact. Gaze aligned appropriately.  Extraocular Movements: Extraocular movements intact.     Conjunctiva/sclera: Conjunctivae normal.  Cardiovascular:     Rate and Rhythm: Normal rate and regular rhythm.     Heart sounds: Normal heart sounds, S1 normal and S2 normal.  Pulmonary:     Effort: Pulmonary effort is normal. No respiratory distress.     Breath sounds: Normal breath sounds and air entry. No decreased breath sounds, wheezing, rhonchi or rales.  Chest:     Chest wall: No tenderness (Unable to reproduce chest pain with palpation of the chest wall).  Musculoskeletal:     Cervical back: Neck supple.  Skin:    General: Skin is warm and dry.     Capillary Refill: Capillary refill takes less than 2 seconds.     Findings: No rash.  Neurological:     General: No focal deficit present.     Mental Status: He is alert and oriented to person, place, and time. Mental status is at baseline.     Cranial Nerves: No dysarthria or facial asymmetry.  Psychiatric:        Mood and Affect: Mood normal.        Speech: Speech normal.        Behavior: Behavior normal.        Thought Content: Thought content normal.        Judgment: Judgment normal.      UC Treatments / Results  Labs (all labs ordered are listed, but only abnormal results are displayed) Labs Reviewed - No data to display  EKG   Radiology No results found.  Procedures Procedures (including critical care time)  Medications Ordered in UC Medications - No data to display  Initial Impression / Assessment and Plan / UC Course  I have reviewed the triage vital signs and the nursing notes.  Pertinent labs & imaging results that were available during my care of the patient were reviewed by me and considered in my medical decision making (see chart for details).   1.  Chest pain, history of MI Unclear etiology of  patient's chest discomfort. I recommend transport to the nearest emergency department via POV for troponin rule out to rule out cardiac etiology of chest discomfort versus GERD versus musculoskeletal chest pain given history and presentation of symptoms today.  Discussed risks of deferring ED visit to which patient further expresses understanding and agreement with plan.  Discharged from urgent care with stable vital signs to ED.     Final Clinical Impressions(s) / UC Diagnoses   Final diagnoses:  Chest pain, unspecified type  History of MI (myocardial infarction)   Discharge Instructions   None    ED Prescriptions   None    PDMP not reviewed this encounter.   Carlisle Beers, [place] 08/20/22 1410

## 2022-08-20 NOTE — ED Notes (Signed)
Patient is being discharged from the Urgent Care and sent to the Emergency Department via POV . Per Juliet Rude NP, patient is in need of higher level of care due to Chest pain. Patient is aware and verbalizes understanding of plan of care.  Vitals:   08/20/22 1320  BP: (!) 168/90  Pulse: 68  Resp: 16  Temp: 98.1 F (36.7 C)  SpO2: 95%

## 2022-08-20 NOTE — Discharge Instructions (Addendum)
Follow-up with your heart doctor this week.  Return for new or worsening signs or symptoms.

## 2022-08-20 NOTE — ED Triage Notes (Signed)
Patient here today with c/o chest pain since last night. He has a h/o HTN and a heart attack in 2016. Patient takes Plavix. Yesterday he was working in the yard a lot.

## 2022-08-27 ENCOUNTER — Telehealth: Payer: Self-pay

## 2022-08-27 NOTE — Telephone Encounter (Signed)
Transition Care Management Follow-up Telephone Call Date of discharge and from where: 08/20/2022 [facility] How have you been since you were released from the hospital? Patient stated he is feeling much better. Any questions or concerns? No  Items Reviewed: Did the pt receive and understand the discharge instructions provided? Yes  Medications obtained and verified?  No medication prescribed. Other? No  Any new allergies since your discharge? No  Dietary orders reviewed? Yes Do you have support at home? Yes   Follow up appointments reviewed:  PCP Hospital f/u appt confirmed? Yes  Scheduled to see Sudie Bailey, MD on 08/22/2022 @ [facility]. Specialist Hospital f/u appt confirmed? Yes  Scheduled to see Bettey Mare. Lyman Bishop, NP on 09/04/2022 @ [facility]. Are transportation arrangements needed? No  If their condition worsens, is the pt aware to call PCP or go to the Emergency Dept.? Yes Was the patient provided with contact information for the PCP's office or ED? Yes Was to pt encouraged to call back with questions or concerns? Yes  Aiyla Baucom [facility]  [facility] Community Resource Care Guide   ??[email or web address]  ?? [phone]   Website: [email or web address]  [email or web address]

## 2022-09-02 NOTE — Progress Notes (Unsigned)
Cardiology Clinic Note   Patient Name: Vincent Flores Date of Encounter: 09/04/2022  Primary Care Provider:  Georgianne Fick, MD Primary Cardiologist:  Vincent Guadalajara, MD  Patient Profile    72 year old male with history of coronary artery disease,(PCI with stenting of his LAD with a Synergy 3.023 mm stent postdilated to 3.25 mm with the100% occlusion be reduced to 0% and return of TIMI-3 flow. He had concomitant CAD with 40 - 50% mid, and 60% distal RCA stenoses, and 60 and 50% marginal stenoses,  hypertension, OSA, and mixed hyperlipidemia.  Most recent nuclear medicine study showed only minimal apical defect and was felt to be low risk.  He was to continue on DAPT.  Last seen by Dr. Tresa Flores on 04/05/2022.  Past Medical History    Past Medical History:  Diagnosis Date   CAD (coronary artery disease)    a. NSTEMI 10/16: mid LAD 100% >> PCI with Synergy DES   HTN (hypertension)    Hyperlipidemia    Ischemic cardiomyopathy    a. Echo 10/16: Moderate LVH, EF 35-40%, anteroseptal akinesis, grade 1 diastolic;  b. Echo 1/17: mild LVH, EF 60-65%, no RWMA, Gr 1 DD.   Past Surgical History:  Procedure Laterality Date   APPENDECTOMY  1967   CARDIAC CATHETERIZATION N/A 10/29/2014   Procedure: Left Heart Cath and Coronary Angiography;  Surgeon: Vincent Records, MD;  Location: [facility] INVASIVE CV LAB;  Service: Cardiovascular;  Laterality: N/A;   CARDIAC CATHETERIZATION N/A 10/29/2014   Procedure: Coronary Stent Intervention;  Surgeon: Vincent Records, MD;  Location: [facility] INVASIVE CV LAB;  Service: Cardiovascular;  Laterality: N/A;   CORONARY STENT PLACEMENT     INGUINAL HERNIA REPAIR     TRANSTHORACIC ECHOCARDIOGRAM  [id number]   normal global wall motion.  normal systolic global function.  calculated EF 61 %    Allergies  No Known Allergies  History of Present Illness    Vincent Flores returns to the office today for ongoing assessment management of coronary artery disease, with other history to include  hyperlipidemia, hypertension, OSA on CPAP.   Seen in the ED for complaints of chest pain.  He was ruled out for ACS, with troponin level of 5.  Potassium was 3.7, he was not found to be anemic.  EKG revealed normal sinus rhythm.  The patient chose to return home and follow-up with cardiology.  Patient states that he was working in his yard the day before coming to the ED.  He was doing some heavy lifting,,  weed eating and mowing, and high-temperature heat.  He did not have to stop due to chest pain or shortness of breath at that time.  It was the following day when he felt the discomfort in his chest and arms bilaterally.  Due to the symptoms he did follow-up in the ED to rule out any cardiac issues causing his discomfort.  He has not had any further incidences of discomfort in his chest or arms since being seen in the ED.  He has not done any extreme yard work or heavy lifting since that time either.  Home Medications    Current Outpatient Medications  Medication Sig Dispense Refill   aspirin EC 81 MG tablet Take 81 mg by mouth daily.     atorvastatin (LIPITOR) 80 MG tablet Take 80 mg by mouth daily.     carvedilol (COREG) 6.25 MG tablet TAKE 1 TABLET BY MOUTH 2 TIMES DAILY WITH A MEAL. 180 tablet 3   clopidogrel (PLAVIX)  75 MG tablet TAKE 1 TABLET BY MOUTH EVERY DAY 90 tablet 3   diphenhydramine-acetaminophen (TYLENOL PM) 25-500 MG TABS tablet Take 2 tablets by mouth at bedtime as needed (pain / sleep).     isosorbide mononitrate (IMDUR) 60 MG 24 hr tablet TAKE 1 TABLET BY MOUTH EVERY DAY 90 tablet 3   lisinopril (ZESTRIL) 10 MG tablet TAKE 1/2 TABLET BY MOUTH DAILY 45 tablet 3   nitroGLYCERIN (NITROSTAT) 0.4 MG SL tablet Place 1 tablet (0.4 mg total) under the tongue every 5 (five) minutes x 3 doses as needed for chest pain. 25 tablet 3   pantoprazole (PROTONIX) 40 MG tablet TAKE 1 TABLET BY MOUTH EVERY DAY 90 tablet 3   No current facility-administered medications for this visit.      Family History    Family History  Problem Relation Age of Onset   Heart disease Mother    Cancer Father        Lung, was a smoker   He indicated that his mother is deceased. He indicated that his father is deceased. He indicated that his sister is alive. He indicated that his brother is deceased. He indicated that his maternal grandmother is deceased. He indicated that his maternal grandfather is deceased. He indicated that his paternal grandmother is deceased. He indicated that his paternal grandfather is deceased. He indicated that his daughter is alive. He indicated that his son is alive.  Social History    Social History   Socioeconomic History   Marital status: Married    Spouse name: Not on file   Number of children: Not on file   Years of education: Not on file   Highest education level: Not on file  Occupational History   Not on file  Tobacco Use   Smoking status: Former    Current packs/day: 0.00    Types: Cigarettes    Quit date: 01/08/2009    Years since quitting: 13.6   Smokeless tobacco: Never  Substance and Sexual Activity   Alcohol use: Yes    Alcohol/week: 21.0 standard drinks of alcohol    Types: 21 Standard drinks or equivalent per week   Drug use: No   Sexual activity: Yes    Partners: Female  Other Topics Concern   Not on file  Social History Narrative   Not on file   Social Determinants of Health   Financial Resource Strain: Not on file  Food Insecurity: Not on file  Transportation Needs: Not on file  Physical Activity: Not on file  Stress: Not on file  Social Connections: Not on file  Intimate Partner Violence: Not on file     Review of Systems    General:  No chills, fever, night sweats or weight changes.  Cardiovascular:  No chest pain, dyspnea on exertion, edema, orthopnea, palpitations, paroxysmal nocturnal dyspnea. Dermatological: No rash, lesions/masses Respiratory: No cough, dyspnea Urologic: No hematuria, dysuria Abdominal:    No nausea, vomiting, diarrhea, bright red blood per rectum, melena, or hematemesis Neurologic:  No visual changes, wkns, changes in mental status. All other systems reviewed and are otherwise negative except as noted above.       Physical Exam    VS:  BP 116/74   Pulse 67   Ht 5\' 6"  (1.676 m)   Wt 218 lb 12.8 oz (99.2 kg)   SpO2 95%   BMI 35.32 kg/m  , BMI Body mass index is 35.32 kg/m.     GEN: Well nourished, well developed, in  no acute distress. HEENT: normal. Neck: Supple, no JVD, carotid bruits, or masses. Cardiac: RRR, no murmurs, rubs, or gallops. No clubbing, cyanosis, edema.  Radials/DP/PT 2+ and equal bilaterally.  Respiratory:  Respirations regular and unlabored, clear to auscultation bilaterally. GI: Soft, nontender, nondistended, BS + x 4. MS: no deformity or atrophy. Skin: warm and dry, no rash. Neuro:  Strength and sensation are intact. Psych: Normal affect.      Lab Results  Component Value Date   WBC 11.3 (H) 08/20/2022   HGB 14.5 08/20/2022   HCT 41.9 08/20/2022   MCV 99.3 08/20/2022   PLT 141 (L) 08/20/2022   Lab Results  Component Value Date   CREATININE 0.94 08/20/2022   BUN 11 08/20/2022   NA 138 08/20/2022   K 3.7 08/20/2022   CL 101 08/20/2022   CO2 24 08/20/2022   Lab Results  Component Value Date   ALT 29 07/09/2018   AST 23 07/09/2018   ALKPHOS 84 07/09/2018   BILITOT 0.7 07/09/2018   Lab Results  Component Value Date   CHOL 160 07/09/2018   HDL 51 07/09/2018   LDLCALC 78 07/09/2018   TRIG 154 (H) 07/09/2018   CHOLHDL 3.1 07/09/2018    Lab Results  Component Value Date   HGBA1C 5.3 07/22/2017     Review of Prior Studies    NM Stress Test 07/19/2017 Nuclear stress EF: 55%. There was no ST segment deviation noted during stress. No T wave inversion was noted during stress. Defect 1: There is a small defect of mild severity present in the apex location. This is a low risk study.   There is a tiny apical fixed defect,  consider apical thinning artifact versus small scar of previous LAD territory infarction. Low risk stress nuclear study with otherwise normal perfusion and normal left ventricular regional and global systolic function.  Assessment & Plan   1.  Noncardiac chest pain: Most likely musculoskeletal pain from yard work and heavy lifting.  This occurred the day following the exertional activity.  He has not had any further recurrence of any chest discomfort since being seen in the ED.  Continue current medication regimen and activities for now.  If chest pain is recurrent during exertional activities he will need to follow-up with [place] at which time we will likely do a stress test.  2.  Coronary artery disease: History of NSTEMI in 2017 with stent to the LAD and residual disease in other vessels.  He has been doing well since the yard work and has had no recurrent stable or unstable angina.  I will continue him on current medication regimen.  Secondary prevention with blood pressure control, lipid management, weight management, and purposeful exercise.  He will continue on dual antiplatelet therapy with aspirin and Plavix.  May need to consider stopping this on follow-up visit.  3.  Hypercholesterolemia: Continue atorvastatin 80 mg daily.  Goal of LDL less than 70.  Labs are followed by primary care and is due to see him in September 2024 for fasting labs.  4.  GERD: Continue PPI.  Follow-up with PCP if symptoms worsen concerning reflux.      Signed, Bettey Mare. Liborio Nixon, ANP, AACC   09/04/2022 4:31 PM      Office [phone] Fax [phone]  Notice: This dictation was prepared with Dragon dictation along with smaller phrase technology. Any transcriptional errors that result from this process are unintentional and may not be corrected upon review.

## 2022-09-04 ENCOUNTER — Encounter: Payer: Self-pay | Admitting: Adult Health

## 2022-09-04 ENCOUNTER — Ambulatory Visit: Payer: PPO | Attending: Adult Health | Admitting: Adult Health

## 2022-09-04 VITALS — BP 116/74 | HR 67 | Ht 66.0 in | Wt 218.8 lb

## 2022-09-04 DIAGNOSIS — I2510 Atherosclerotic heart disease of native coronary artery without angina pectoris: Secondary | ICD-10-CM

## 2022-09-04 NOTE — Patient Instructions (Signed)
Medication Instructions:  No changes *If you need a refill on your cardiac medications before your next appointment, please call your pharmacy*   Lab Work: No Labs If you have labs (blood work) drawn today and your tests are completely normal, you will receive your results only by: MyChart Message (if you have MyChart) OR A paper copy in the mail If you have any lab test that is abnormal or we need to change your treatment, we will call you to review the results.   Testing/Procedures: No Testing   Follow-Up: At [facility], you and your health needs are our priority.  As part of our continuing mission to provide you with exceptional heart care, we have created designated Provider Care Teams.  These Care Teams include your primary Cardiologist (physician) and Advanced Practice Providers (APPs -  Physician Assistants and Nurse Practitioners) who all work together to provide you with the care you need, when you need it.  We recommend signing up for the patient portal called "MyChart".  Sign up information is provided on this After Visit Summary.  MyChart is used to connect with patients for Virtual Visits (Telemedicine).  Patients are able to view lab/test results, encounter notes, upcoming appointments, etc.  Non-urgent messages can be sent to your provider as well.   To learn more about what you can do with MyChart, go to [email or web address].    Your next appointment:   1 year(s)  Provider:   Nicki Guadalajara, MD

## 2022-09-11 ENCOUNTER — Other Ambulatory Visit: Payer: Self-pay | Admitting: Cardiovascular Disease

## 2022-09-13 ENCOUNTER — Telehealth: Payer: Self-pay

## 2022-09-13 DIAGNOSIS — K429 Umbilical hernia without obstruction or gangrene: Secondary | ICD-10-CM | POA: Diagnosis not present

## 2022-09-13 DIAGNOSIS — M6208 Separation of muscle (nontraumatic), other site: Secondary | ICD-10-CM | POA: Diagnosis not present

## 2022-09-13 NOTE — Telephone Encounter (Signed)
.    Name: Vincent Flores  DOB: 1950-11-07  MRN: [id number]   Primary Cardiologist: Nicki Guadalajara, MD  Chart reviewed as part of pre-operative protocol coverage. Patient was contacted 09/13/2022 in reference to pre-operative risk assessment for pending surgery as outlined below.  Vincent Flores was last seen on 09/04/2022 by Joni Reining NP.  Since that day, Vincent Flores has done well. No new cardiac complaints.  Therefore, based on ACC/AHA guidelines, the patient would be at acceptable risk for the planned procedure without further cardiovascular testing.   Per office protocol, if patient is without any new symptoms or concerns at the time of their virtual visit, he/she may hold ASA and Plavix for 5 days prior to procedure. Please resume ASA and Plavix as soon as possible postprocedure, at the discretion of the surgeon.    The patient was advised that if he develops new symptoms prior to surgery to contact our office to arrange for a follow-up visit, and he verbalized understanding.  I will route this recommendation to the requesting party via Epic fax function and remove from pre-op pool. Please call with questions.  Joni Reining, NP 09/13/2022, 12:27 PM

## 2022-09-13 NOTE — Telephone Encounter (Signed)
   Pre-operative Risk Assessment    Patient Name: Vincent Flores  DOB: October 24, 1950 MRN: [id number]   Last OV 09/04/22 with Joni Reining, NP Next OV None   Request for Surgical Clearance    Procedure:   Umbilical Hernia Surgery  Date of Surgery:  Clearance TBD                                 Surgeon:  Ivar Drape, MD Surgeon's Group or Practice Name:  [facility] Phone number:  [phone] Fax number:  [phone] ATTN Trellis Moment, CMA   Type of Clearance Requested:   - Medical  - Pharmacy:  Hold Aspirin and Clopidogrel (Plavix) pt will need instructions on when/if to hold   Type of Anesthesia:  General    Additional requests/questions:    Signed, Zada Finders   09/13/2022, 11:43 AM

## 2022-09-20 ENCOUNTER — Ambulatory Visit: Payer: Self-pay | Admitting: Surgery

## 2022-09-26 NOTE — Patient Instructions (Signed)
SURGICAL WAITING ROOM VISITATION  Patients having surgery or a procedure may have no more than 2 support people in the waiting area - these visitors may rotate.    Children under the age of 36 must have an adult with them who is not the patient.  Due to an increase in RSV and influenza rates and associated hospitalizations, children ages 90 and under may not visit patients in [facility] hospitals.  If the patient needs to stay at the hospital during part of their recovery, the visitor guidelines for inpatient rooms apply. Pre-op nurse will coordinate an appropriate time for 1 support person to accompany patient in pre-op.  This support person may not rotate.    Please refer to the [facility] website for the visitor guidelines for Inpatients (after your surgery is over and you are in a regular room).       Your procedure is scheduled on:  10/15/2022    Report to [facility] Main Entrance    Report to admitting at   1230 pm    Call this number if you have problems the morning of surgery [phone]   Do not eat food :After Midnight.   After Midnight you may have the following liquids until _1130_____ AM  DAY OF SURGERY  Water Non-Citrus Juices (without pulp, NO RED-Apple, White grape, White cranberry) Black Coffee (NO MILK/CREAM OR CREAMERS, sugar ok)  Clear Tea (NO MILK/CREAM OR CREAMERS, sugar ok) regular and decaf                             Plain Jell-O (NO RED)                                           Fruit ices (not with fruit pulp, NO RED)                                     Popsicles (NO RED)                                                               Sports drinks like Gatorade (NO RED)                     The day of surgery:  Drink ONE (1) Pre-Surgery Clear Ensure or G2 at 1130AM  ( have completed by ) the morning of surgery. Drink in one sitting. Do not sip.  This drink was given to you during your hospital  pre-op appointment visit. Nothing else to  drink after completing the  Pre-Surgery Clear Ensure or G2.          If you have questions, please contact your surgeon's office.       Oral Hygiene is also important to reduce your risk of infection.                                    Remember - BRUSH YOUR TEETH THE MORNING OF SURGERY  WITH YOUR REGULAR TOOTHPASTE  DENTURES WILL BE REMOVED PRIOR TO SURGERY PLEASE DO NOT APPLY "Poly grip" OR ADHESIVES!!!   Do NOT smoke after Midnight   Stop all vitamins and herbal supplements 7 days before surgery.   Take these medicines the morning of surgery with A SIP OF WATER:  coreg, imdur, protonix   DO NOT TAKE ANY ORAL DIABETIC MEDICATIONS DAY OF YOUR SURGERY  Bring CPAP mask and tubing day of surgery.                              You may not have any metal on your body including hair pins, jewelry, and body piercing             Do not wear make-up, lotions, powders, perfumes/cologne, or deodorant  Do not wear nail polish including gel and S&S, artificial/acrylic nails, or any other type of covering on natural nails including finger and toenails. If you have artificial nails, gel coating, etc. that needs to be removed by a nail salon please have this removed prior to surgery or surgery may need to be canceled/ delayed if the surgeon/ anesthesia feels like they are unable to be safely monitored.   Do not shave  48 hours prior to surgery.               Men may shave face and neck.   Do not bring valuables to the hospital. [facility] IS NOT             RESPONSIBLE   FOR VALUABLES.   Contacts, glasses, dentures or bridgework may not be worn into surgery.   Bring small overnight bag day of surgery.   DO NOT BRING YOUR HOME MEDICATIONS TO THE HOSPITAL. PHARMACY WILL DISPENSE MEDICATIONS LISTED ON YOUR MEDICATION LIST TO YOU DURING YOUR ADMISSION IN THE HOSPITAL!    Patients discharged on the day of surgery will not be allowed to drive home.  Someone NEEDS to stay with you for the first 24  hours after anesthesia.   Special Instructions: Bring a copy of your healthcare power of attorney and living will documents the day of surgery if you haven't scanned them before.              Please read over the following fact sheets you were given: IF YOU HAVE QUESTIONS ABOUT YOUR PRE-OP INSTRUCTIONS PLEASE CALL [phone]   If you received a COVID test during your pre-op visit  it is requested that you wear a mask when out in public, stay away from anyone that may not be feeling well and notify your surgeon if you develop symptoms. If you test positive for Covid or have been in contact with anyone that has tested positive in the last 10 days please notify you surgeon.    [facility] - Preparing for Surgery Before surgery, you can play an important role.  Because skin is not sterile, your skin needs to be as free of germs as possible.  You can reduce the number of germs on your skin by washing with CHG (chlorahexidine gluconate) soap before surgery.  CHG is an antiseptic cleaner which kills germs and bonds with the skin to continue killing germs even after washing. Please DO NOT use if you have an allergy to CHG or antibacterial soaps.  If your skin becomes reddened/irritated stop using the CHG and inform your nurse when you arrive at Short Stay. Do not shave (  including legs and underarms) for at least 48 hours prior to the first CHG shower.  You may shave your face/neck. Please follow these instructions carefully:  1.  Shower with CHG Soap the night before surgery and the  morning of Surgery.  2.  If you choose to wash your hair, wash your hair first as usual with your  normal  shampoo.  3.  After you shampoo, rinse your hair and body thoroughly to remove the  shampoo.                           4.  Use CHG as you would any other liquid soap.  You can apply chg directly  to the skin and wash                       Gently with a scrungie or clean washcloth.  5.  Apply the CHG Soap to your body  ONLY FROM THE NECK DOWN.   Do not use on face/ open                           Wound or open sores. Avoid contact with eyes, ears mouth and genitals (private parts).                       Wash face,  Genitals (private parts) with your normal soap.             6.  Wash thoroughly, paying special attention to the area where your surgery  will be performed.  7.  Thoroughly rinse your body with warm water from the neck down.  8.  DO NOT shower/wash with your normal soap after using and rinsing off  the CHG Soap.                9.  Pat yourself dry with a clean towel.            10.  Wear clean pajamas.            11.  Place clean sheets on your bed the night of your first shower and do not  sleep with pets. Day of Surgery : Do not apply any lotions/deodorants the morning of surgery.  Please wear clean clothes to the hospital/surgery center.  FAILURE TO FOLLOW THESE INSTRUCTIONS MAY RESULT IN THE CANCELLATION OF YOUR SURGERY PATIENT SIGNATURE_________________________________  NURSE SIGNATURE__________________________________  ________________________________________________________________________

## 2022-09-26 NOTE — Progress Notes (Addendum)
Anesthesia Review:  PCP: Dr Nicholos Johns  Cardiologist : T Roland Rack LOV 09/04/22 clearance 09/13/22- telephone encounter  Chest x-ray : 08/20/22- 2 view  CT Chest- 03/29/22  EKG : 09/04/22  Echo : 2017  Stress test: 2019  Cardiac Cath :  2016  Activity level:  can do a flight of stairs without difficutly  Sleep Study/ CPAP : has cpap  Fasting Blood Sugar :      / Checks Blood Sugar -- times a day:   Blood Thinner/ Instructions /Last Dose: ASA / Instructions/ Last Dose :    Prediabetes- on no meds , does not check glucose at home    81 mg aspirin  Plavix - stop 5 days prior to surgery per pt    08/20/22- In ED with chest pain    [facility]

## 2022-10-01 ENCOUNTER — Encounter (HOSPITAL_COMMUNITY)
Admission: RE | Admit: 2022-10-01 | Discharge: 2022-10-01 | Disposition: A | Discharge status: Home / Self Care | Payer: PPO | Source: Ambulatory Visit | Attending: Surgery

## 2022-10-01 ENCOUNTER — Other Ambulatory Visit: Payer: Self-pay

## 2022-10-01 ENCOUNTER — Encounter (HOSPITAL_COMMUNITY): Payer: Self-pay

## 2022-10-01 VITALS — BP 124/77 | HR 56 | Temp 98.3°F | Resp 16 | Ht 66.0 in | Wt 235.0 lb

## 2022-10-01 DIAGNOSIS — Z01818 Encounter for other preprocedural examination: Secondary | ICD-10-CM | POA: Diagnosis present

## 2022-10-01 DIAGNOSIS — Z01812 Encounter for preprocedural laboratory examination: Secondary | ICD-10-CM | POA: Diagnosis not present

## 2022-10-01 HISTORY — DX: Sleep apnea, unspecified: G47.30

## 2022-10-01 HISTORY — DX: Acute myocardial infarction, unspecified: I21.9

## 2022-10-01 HISTORY — DX: Unspecified osteoarthritis, unspecified site: M19.90

## 2022-10-01 HISTORY — DX: Prediabetes: R73.03

## 2022-10-01 LAB — BASIC METABOLIC PANEL
Anion gap: 7 (ref 5–15)
BUN: 13 mg/dL (ref 8–23)
CO2: 27 mmol/L (ref 22–32)
Calcium: 9.3 mg/dL (ref 8.9–10.3)
Chloride: 104 mmol/L (ref 98–111)
Creatinine, Ser: 1.06 mg/dL (ref 0.61–1.24)
GFR, Estimated: >60 mL/min (ref >60)
Glucose, Bld: 93 mg/dL (ref 70–99)
Potassium: 5 mmol/L (ref 3.5–5.1)
Sodium: 138 mmol/L (ref 135–145)

## 2022-10-01 LAB — CBC
HCT: 42.5 % (ref 39.0–52.0)
Hemoglobin: 14.3 g/dL (ref 13.0–17.0)
MCH: 33.4 pg (ref 26.0–34.0)
MCHC: 33.6 g/dL (ref 30.0–36.0)
MCV: 99.3 fL (ref 80.0–100.0)
Platelets: 143 10*3/uL — ABNORMAL LOW (ref 150–400)
RBC: 4.28 MIL/uL (ref 4.22–5.81)
RDW: 12.3 % (ref 11.5–15.5)
WBC: 7.3 10*3/uL (ref 4.0–10.5)
nRBC: 0 % (ref 0.0–0.2)

## 2022-10-05 NOTE — Anesthesia Preprocedure Evaluation (Addendum)
Anesthesia Evaluation  Patient identified by MRN, date of birth, ID band Patient awake    Reviewed: Allergy & Precautions, NPO status , Patient's Chart, lab work & pertinent test results, reviewed documented beta blocker date and time   Airway Mallampati: I  TM Distance: >3 FB Neck ROM: Full    Dental  (+) Edentulous Upper, Edentulous Lower, Dental Advisory Given   Pulmonary sleep apnea and Continuous Positive Airway Pressure Ventilation , former smoker   Pulmonary exam normal breath sounds clear to auscultation       Cardiovascular hypertension, Pt. on home beta blockers and Pt. on medications + angina  + CAD, + Past MI and + Cardiac Stents  Normal cardiovascular exam Rhythm:Regular Rate:Normal  Myocardial Perfusion 07/19/2017  Nuclear stress EF: 55%.  There was no ST segment deviation noted during stress.  No T wave inversion was noted during stress.  Defect 1: There is a small defect of mild severity present in the apex location.  This is a low risk study.   There is a tiny apical fixed defect, consider apical thinning artifact versus small scar of previous LAD territory infarction. Low risk stress nuclear study with otherwise normal perfusion and normal left ventricular regional and global systolic function.  TTE 2017 - LVEF 60-65%, mild LVH, normal wall motion, diastolic dysfunction,    indeterminate LV filling pressure, normal LA size, normal IVC.     Neuro/Psych negative neurological ROS  negative psych ROS   GI/Hepatic Neg liver ROS,GERD  ,,  Endo/Other  negative endocrine ROS    Renal/GU negative Renal ROS  negative genitourinary   Musculoskeletal  (+) Arthritis ,    Abdominal   Peds  Hematology  (+) Blood dyscrasia (plavix LD 10/11/22)   Anesthesia Other Findings   Reproductive/Obstetrics                             Anesthesia Physical Anesthesia Plan  ASA:  3  Anesthesia Plan: General   Post-op Pain Management: Tylenol PO (pre-op)* and Lidocaine infusion*   Induction: Intravenous  PONV Risk Score and Plan: 2 and Dexamethasone, Ondansetron and Treatment may vary due to age or medical condition  Airway Management Planned: Oral ETT  Additional Equipment:   Intra-op Plan:   Post-operative Plan: Extubation in OR  Informed Consent: I have reviewed the patients History and Physical, chart, labs and discussed the procedure including the risks, benefits and alternatives for the proposed anesthesia with the patient or authorized representative who has indicated his/her understanding and acceptance.     Dental advisory given  Plan Discussed with: CRNA  Anesthesia Plan Comments: (See PAT note 10/01/22)       Anesthesia Quick Evaluation

## 2022-10-05 NOTE — Progress Notes (Signed)
Anesthesia Chart Review   Case: [id number] Date/Time: 10/15/22 1415   Procedure: HERNIA REPAIR UMBILICAL ADULT WITH MESH   Anesthesia type: General   Pre-op diagnosis: UMBILICAL HERNIA   Location: [facility] ROOM 02 / [facility]   Surgeons: Vincent Ore, MD       DISCUSSION:72 y.o. former smoker with h/o HTN, sleep apnea w/cpap, CAD s/p DES to LAD 2017, ischemic cardiomyopathy, umbilical hernia scheduled for above procedure 10/15/2022 with Dr. Ivar Flores.   Per cardiology preoperative evaluation 09/13/2022, "Chart reviewed as part of pre-operative protocol coverage. Patient was contacted 09/13/2022 in reference to pre-operative risk assessment for pending surgery as outlined below.  Vincent Flores was last seen on 09/04/2022 by Vincent Reining NP.  Since that day, Vincent Flores has done well. No new cardiac complaints.   Therefore, based on ACC/AHA guidelines, the patient would be at acceptable risk for the planned procedure without further cardiovascular testing.    Per office protocol, if patient is without any new symptoms or concerns at the time of their virtual visit, he/she may hold ASA and Plavix for 5 days prior to procedure. Please resume ASA and Plavix as soon as possible postprocedure, at the discretion of the surgeon.  "  Anticipate pt can proceed with planned procedure barring acute status change.   VS: BP 124/77   Pulse (!) 56   Temp 36.8 C (Oral)   Resp 16   Ht 5\' 6"  (1.676 m)   Wt 106.6 kg   SpO2 99%   BMI 37.93 kg/m   PROVIDERS: Vincent Fick, MD is PCP   Primary Cardiologist: Vincent Guadalajara, MD  LABS: Labs reviewed: Acceptable for surgery. (all labs ordered are listed, but only abnormal results are displayed)  Labs Reviewed  CBC - Abnormal; Notable for the following components:      Result Value   Platelets 143 (*)    All other components within normal limits  BASIC METABOLIC PANEL     IMAGES:   EKG:   CV: Myocardial Perfusion  07/19/2017 Nuclear stress EF: 55%. There was no ST segment deviation noted during stress. No T wave inversion was noted during stress. Defect 1: There is a small defect of mild severity present in the apex location. This is a low risk study.   There is a tiny apical fixed defect, consider apical thinning artifact versus small scar of previous LAD territory infarction. Low risk stress nuclear study with otherwise normal perfusion and normal left ventricular regional and global systolic function. Past Medical History:  Diagnosis Date   Arthritis    CAD (coronary artery disease)    a. NSTEMI 10/16: mid LAD 100% >> PCI with Synergy DES   HTN (hypertension)    Hyperlipidemia    Ischemic cardiomyopathy    a. Echo 10/16: Moderate LVH, EF 35-40%, anteroseptal akinesis, grade 1 diastolic;  b. Echo 1/17: mild LVH, EF 60-65%, no RWMA, Gr 1 DD.   Myocardial infarction Vincent Flores)    Pre-diabetes    Sleep apnea    has cpap    Past Surgical History:  Procedure Laterality Date   APPENDECTOMY  1967   APPENDECTOMY     CARDIAC CATHETERIZATION N/A 10/29/2014   Procedure: Left Heart Cath and Coronary Angiography;  Surgeon: Vincent Records, MD;  Location: [facility] INVASIVE CV LAB;  Service: Cardiovascular;  Laterality: N/A;   CARDIAC CATHETERIZATION N/A 10/29/2014   Procedure: Coronary Stent Intervention;  Surgeon: Vincent Records, MD;  Location: [facility] INVASIVE CV LAB;  Service: Cardiovascular;  Laterality: N/A;   CORONARY STENT PLACEMENT     INGUINAL HERNIA REPAIR     MOUTH SURGERY     TRANSTHORACIC ECHOCARDIOGRAM  11/08/2011   normal global wall motion.  normal systolic global function.  calculated EF 61 %    MEDICATIONS:  aspirin EC 81 MG tablet   atorvastatin (LIPITOR) 80 MG tablet   carvedilol (COREG) 6.25 MG tablet   clopidogrel (PLAVIX) 75 MG tablet   diphenhydramine-acetaminophen (TYLENOL PM) 25-500 MG TABS tablet   isosorbide mononitrate (IMDUR) 60 MG 24 hr tablet   lisinopril (ZESTRIL) 10 MG tablet    nitroGLYCERIN (NITROSTAT) 0.4 MG SL tablet   pantoprazole (PROTONIX) 40 MG tablet   No current facility-administered medications for this encounter.     Vincent Cipro Ward, PA-C [facility]-Surgical Testing [phone]

## 2022-10-15 ENCOUNTER — Ambulatory Visit (HOSPITAL_COMMUNITY): Payer: PPO | Admitting: Physician Assistant

## 2022-10-15 ENCOUNTER — Other Ambulatory Visit: Payer: Self-pay

## 2022-10-15 ENCOUNTER — Encounter (HOSPITAL_COMMUNITY): Payer: Self-pay | Admitting: Surgery

## 2022-10-15 ENCOUNTER — Encounter (HOSPITAL_COMMUNITY)
Admission: RE | Disposition: A | Discharge status: Home / Self Care | Payer: Self-pay | Source: Home / Self Care | Attending: Surgery

## 2022-10-15 ENCOUNTER — Ambulatory Visit (HOSPITAL_COMMUNITY)
Admission: RE | Admit: 2022-10-15 | Discharge: 2022-10-15 | Disposition: A | Discharge status: Home / Self Care | Payer: PPO | Attending: Surgery | Admitting: Surgery

## 2022-10-15 ENCOUNTER — Ambulatory Visit (HOSPITAL_BASED_OUTPATIENT_CLINIC_OR_DEPARTMENT_OTHER): Payer: PPO | Admitting: Certified Registered Nurse Anesthetist

## 2022-10-15 DIAGNOSIS — Z7902 Long term (current) use of antithrombotics/antiplatelets: Secondary | ICD-10-CM | POA: Insufficient documentation

## 2022-10-15 DIAGNOSIS — K219 Gastro-esophageal reflux disease without esophagitis: Secondary | ICD-10-CM | POA: Insufficient documentation

## 2022-10-15 DIAGNOSIS — I25119 Atherosclerotic heart disease of native coronary artery with unspecified angina pectoris: Secondary | ICD-10-CM | POA: Diagnosis not present

## 2022-10-15 DIAGNOSIS — I2510 Atherosclerotic heart disease of native coronary artery without angina pectoris: Secondary | ICD-10-CM

## 2022-10-15 DIAGNOSIS — G4730 Sleep apnea, unspecified: Secondary | ICD-10-CM | POA: Insufficient documentation

## 2022-10-15 DIAGNOSIS — I10 Essential (primary) hypertension: Secondary | ICD-10-CM | POA: Insufficient documentation

## 2022-10-15 DIAGNOSIS — D759 Disease of blood and blood-forming organs, unspecified: Secondary | ICD-10-CM | POA: Diagnosis not present

## 2022-10-15 DIAGNOSIS — Z87891 Personal history of nicotine dependence: Secondary | ICD-10-CM | POA: Insufficient documentation

## 2022-10-15 DIAGNOSIS — I252 Old myocardial infarction: Secondary | ICD-10-CM | POA: Diagnosis not present

## 2022-10-15 DIAGNOSIS — K429 Umbilical hernia without obstruction or gangrene: Secondary | ICD-10-CM | POA: Insufficient documentation

## 2022-10-15 DIAGNOSIS — Z01818 Encounter for other preprocedural examination: Secondary | ICD-10-CM

## 2022-10-15 DIAGNOSIS — Z955 Presence of coronary angioplasty implant and graft: Secondary | ICD-10-CM | POA: Diagnosis not present

## 2022-10-15 HISTORY — PX: UMBILICAL HERNIA REPAIR: SHX196

## 2022-10-15 SURGERY — REPAIR, HERNIA, UMBILICAL, ADULT
Anesthesia: General

## 2022-10-15 MED ORDER — ENOXAPARIN SODIUM 40 MG/0.4ML IJ SOSY
40.0000 mg | PREFILLED_SYRINGE | Freq: Once | INTRAMUSCULAR | Status: AC
  Administered 2022-10-15: 40 mg via SUBCUTANEOUS
  Filled 2022-10-15: qty 0.4

## 2022-10-15 MED ORDER — LACTATED RINGERS IV SOLN: INTRAVENOUS | Status: DC

## 2022-10-15 MED ORDER — LIDOCAINE 2% (20 MG/ML) 5 ML SYRINGE
INTRAMUSCULAR | Status: DC | PRN
  Administered 2022-10-15: 100 mg via INTRAVENOUS

## 2022-10-15 MED ORDER — ROCURONIUM BROMIDE 10 MG/ML (PF) SYRINGE
PREFILLED_SYRINGE | INTRAVENOUS | Status: DC | PRN
  Administered 2022-10-15: 70 mg via INTRAVENOUS

## 2022-10-15 MED ORDER — DEXAMETHASONE SODIUM PHOSPHATE 10 MG/ML IJ SOLN
INTRAMUSCULAR | Status: DC | PRN
  Administered 2022-10-15: 10 mg via INTRAVENOUS

## 2022-10-15 MED ORDER — BUPIVACAINE LIPOSOME 1.3 % IJ SUSP: 20.0000 mL | Freq: Once | INTRAMUSCULAR | Status: DC

## 2022-10-15 MED ORDER — CHLORHEXIDINE GLUCONATE 0.12 % MT SOLN
15.0000 mL | Freq: Once | OROMUCOSAL | Status: AC
  Administered 2022-10-15: 15 mL via OROMUCOSAL

## 2022-10-15 MED ORDER — PROPOFOL 10 MG/ML IV BOLUS
INTRAVENOUS | Status: AC
  Filled 2022-10-15: qty 20

## 2022-10-15 MED ORDER — SUGAMMADEX SODIUM 200 MG/2ML IV SOLN
INTRAVENOUS | Status: DC | PRN
  Administered 2022-10-15: 200 mg via INTRAVENOUS

## 2022-10-15 MED ORDER — OXYCODONE-ACETAMINOPHEN 5-325 MG PO TABS
1.0000 | ORAL_TABLET | ORAL | 0 refills | Status: DC | PRN
Start: 2022-10-15 — End: 2023-04-30

## 2022-10-15 MED ORDER — GABAPENTIN 300 MG PO CAPS
300.0000 mg | ORAL_CAPSULE | ORAL | Status: AC
  Administered 2022-10-15: 300 mg via ORAL
  Filled 2022-10-15: qty 1

## 2022-10-15 MED ORDER — BUPIVACAINE HCL (PF) 0.25 % IJ SOLN
INTRAMUSCULAR | Status: DC | PRN
Start: 2022-10-15 — End: 2022-10-15
  Administered 2022-10-15: 10 mL

## 2022-10-15 MED ORDER — ROCURONIUM BROMIDE 10 MG/ML (PF) SYRINGE
PREFILLED_SYRINGE | INTRAVENOUS | Status: AC
  Filled 2022-10-15: qty 10

## 2022-10-15 MED ORDER — FENTANYL CITRATE (PF) 250 MCG/5ML IJ SOLN
INTRAMUSCULAR | Status: AC
  Filled 2022-10-15: qty 5

## 2022-10-15 MED ORDER — PROPOFOL 10 MG/ML IV BOLUS
INTRAVENOUS | Status: DC | PRN
  Administered 2022-10-15: 130 mg via INTRAVENOUS

## 2022-10-15 MED ORDER — FENTANYL CITRATE (PF) 250 MCG/5ML IJ SOLN
INTRAMUSCULAR | Status: DC | PRN
  Administered 2022-10-15: 25 ug via INTRAVENOUS
  Administered 2022-10-15: 100 ug via INTRAVENOUS

## 2022-10-15 MED ORDER — CHLORHEXIDINE GLUCONATE CLOTH 2 % EX PADS: 6.0000 | MEDICATED_PAD | Freq: Once | CUTANEOUS | Status: DC

## 2022-10-15 MED ORDER — ONDANSETRON HCL 4 MG/2ML IJ SOLN
INTRAMUSCULAR | Status: DC | PRN
  Administered 2022-10-15: 4 mg via INTRAVENOUS

## 2022-10-15 MED ORDER — ORAL CARE MOUTH RINSE: 15.0000 mL | Freq: Once | OROMUCOSAL | Status: AC

## 2022-10-15 MED ORDER — ACETAMINOPHEN 500 MG PO TABS
1000.0000 mg | ORAL_TABLET | ORAL | Status: AC
  Administered 2022-10-15: 1000 mg via ORAL
  Filled 2022-10-15: qty 2

## 2022-10-15 MED ORDER — BUPIVACAINE HCL 0.25 % IJ SOLN
INTRAMUSCULAR | Status: AC
  Filled 2022-10-15: qty 1

## 2022-10-15 MED ORDER — CEFAZOLIN SODIUM-DEXTROSE 2-4 GM/100ML-% IV SOLN
2.0000 g | INTRAVENOUS | Status: AC
  Administered 2022-10-15: 2 g via INTRAVENOUS
  Filled 2022-10-15: qty 100

## 2022-10-15 MED ORDER — LIDOCAINE HCL (PF) 2 % IJ SOLN
INTRAMUSCULAR | Status: AC
  Filled 2022-10-15: qty 5

## 2022-10-15 MED ORDER — FENTANYL CITRATE PF 50 MCG/ML IJ SOSY: 25.0000 ug | PREFILLED_SYRINGE | INTRAMUSCULAR | Status: DC | PRN

## 2022-10-15 MED ORDER — 0.9 % SODIUM CHLORIDE (POUR BTL) OPTIME
TOPICAL | Status: DC | PRN
Start: 2022-10-15 — End: 2022-10-15
  Administered 2022-10-15: 1000 mL

## 2022-10-15 SURGICAL SUPPLY — 27 items
ADH SKN CLS APL DERMABOND .7 (GAUZE/BANDAGES/DRESSINGS) ×1
BALL CTTN LRG ABS STRL LF (GAUZE/BANDAGES/DRESSINGS) ×1
COTTONBALL LRG STERILE PKG (GAUZE/BANDAGES/DRESSINGS) ×1 IMPLANT
COVER SURGICAL LIGHT HANDLE (MISCELLANEOUS) ×1 IMPLANT
DERMABOND ADVANCED .7 DNX12 (GAUZE/BANDAGES/DRESSINGS) ×1 IMPLANT
DRAPE LAPAROTOMY T 98X78 PEDS (DRAPES) ×1 IMPLANT
DRSG TEGADERM 4X4.75 (GAUZE/BANDAGES/DRESSINGS) ×1 IMPLANT
ELECT REM PT RETURN 15FT ADLT (MISCELLANEOUS) ×1 IMPLANT
GLOVE INDICATOR 8.0 STRL GRN (GLOVE) ×1 IMPLANT
GOWN STRL REUS W/ TWL XL LVL3 (GOWN DISPOSABLE) ×1 IMPLANT
GOWN STRL REUS W/TWL XL LVL3 (GOWN DISPOSABLE) ×1
KIT BASIN OR (CUSTOM PROCEDURE TRAY) ×1 IMPLANT
KIT TURNOVER KIT A (KITS) IMPLANT
MESH BARD SOFT 3X6IN (Mesh General) IMPLANT
NDL HYPO 22X1.5 SAFETY MO (MISCELLANEOUS) ×1 IMPLANT
NEEDLE HYPO 22X1.5 SAFETY MO (MISCELLANEOUS) ×1
NS IRRIG 1000ML POUR BTL (IV SOLUTION) ×1 IMPLANT
PACK GENERAL/GYN (CUSTOM PROCEDURE TRAY) ×1 IMPLANT
SPIKE FLUID TRANSFER (MISCELLANEOUS) ×1 IMPLANT
SUT MNCRL AB 4-0 PS2 18 (SUTURE) ×1 IMPLANT
SUT NOVA NAB GS-21 0 18 T12 DT (SUTURE) IMPLANT
SUT PDS AB 0 CT1 36 (SUTURE) IMPLANT
SUT PDS AB 2-0 CT2 27 (SUTURE) IMPLANT
SUT VIC AB 3-0 SH 8-18 (SUTURE) ×1 IMPLANT
SYR CONTROL 10ML LL (SYRINGE) ×1 IMPLANT
TOWEL OR 17X26 10 PK STRL BLUE (TOWEL DISPOSABLE) ×1 IMPLANT
TRAY FOLEY MTR SLVR 16FR STAT (SET/KITS/TRAYS/PACK) IMPLANT

## 2022-10-15 NOTE — Transfer of Care (Signed)
Immediate Anesthesia Transfer of Care Note  Patient: Vincent Flores  Procedure(s) Performed: HERNIA REPAIR UMBILICAL ADULT WITH MESH  Patient Location: PACU  Anesthesia Type:General  Level of Consciousness: awake, alert , oriented, and patient cooperative  Airway & Oxygen Therapy: Patient Spontanous Breathing and Patient connected to face mask oxygen  Post-op Assessment: Report given to RN, Post -op Vital signs reviewed and stable, and Patient moving all extremities X 4  Post vital signs: Reviewed and stable  Last Vitals:  Vitals Value Taken Time  BP 124/74 10/15/22 1531  Temp    Pulse 65 10/15/22 1533  Resp 17 10/15/22 1533  SpO2 98 % 10/15/22 1533  Vitals shown include unfiled device data.  Last Pain:  Vitals:   10/15/22 1230  TempSrc:   PainSc: 0-No pain         Complications: No notable events documented.

## 2022-10-15 NOTE — Discharge Instructions (Signed)
 VENTRAL HERNIA REPAIR POST OPERATIVE INSTRUCTIONS  Thinking Clearly  The anesthesia may cause you to feel different for 1 or 2 days. Do not drive, drink alcohol, or make any big decisions for at least 2 days.  Nutrition When you wake up, you will be able to drink small amounts of liquid. If you do not feel sick, you can slowly advance your diet to regular foods. Continue to drink lots of fluids, usually about 8 to 10 glasses per day. Eat a high-fiber diet so you don't strain during bowel movements. High-Fiber Foods Foods high in fiber include beans, bran cereals and whole-grain breads, peas, dried fruit (figs, apricots, and dates), raspberries, blackberries, strawberries, sweet corn, broccoli, baked potatoes with skin, plums, pears, apples, greens, and nuts. Activity Slowly increase your activity. Be sure to get up and walk every hour or so to prevent blood clots. No heavy lifting or strenuous activity for 4 weeks following surgery to prevent hernias at your incision sites or recurrence of your hernia. It is normal to feel tired. You may need more sleep than usual.  Get your rest but make sure to get up and move around frequently to prevent blood clots and pneumonia.  Work and Return to School You can go back to work when you feel well enough. Discuss the timing with your surgeon. You can usually go back to school or work 1 week or less after an laparoscopic or an open repair. If your work requires heavy lifting or strenuous activity you need to be placed on light duty for 4 weeks following surgery. You can return to gym class, sports or other physical activities 4 weeks after surgery.  Wound Care You may experience significant bruising throughout the abdominal wall that may track down into the groin including into the scrotum in males.  Rest, elevating the groin and scrotum above the level of the heart, ice and compression with tight fitting underwear or an abdominal binder can help.   Always wash your hands before and after touching near your incision site. Do not soak in a bathtub until cleared at your follow up appointment. You may take a shower 24 hours after surgery. A small amount of drainage from the incision is normal. If the drainage is thick and yellow or the site is red, you may have an infection, so call your surgeon. If you have a drain in one of your incisions, it will be taken out in office when the drainage stops. Steri-Strips will fall off in 7 to 10 days or they will be removed during your first office visit. If you have dermabond glue covering over the incision, allow the glue to flake off on its own. Protect the new skin, especially from the sun. The sun can burn and cause darker scarring. Your scar will heal in about 4 to 6 weeks and will become softer and continue to fade over the next year.  The cosmetic appearance of the incisions will improve over the course of the first year after surgery. Sensation around your incision will return in a few weeks or months.  Bowel Movements After intestinal surgery, you may have loose watery stools for several days. If watery diarrhea lasts longer than 3 days, contact your surgeon. Pain medication (narcotics) can cause constipation. Increase the fiber in your diet with high-fiber foods if you are constipated. You can take an over the counter stool softener like Colace to avoid constipation.  Additional over the counter medications can also be used   if Colace isn't sufficient (for example, Milk of Magnesia or Miralax).  Pain The amount of pain is different for each person. Some people need only 1 to 3 doses of pain control medication, while others need more. Take alternating doses of tylenol and ibuprofen around the clock for the first five days following surgery.  This will provide a baseline of pain control and help with inflammation.  Take the narcotic pain medication in addition if needed for severe pain.  Contact  Your Surgeon at [phone], if you have: Pain that will not go away Pain that gets worse A fever of more than 101F (38.3C) Repeated vomiting Swelling, redness, bleeding, or bad-smelling drainage from your wound site Strong abdominal pain No bowel movement or unable to pass gas for 3 days Watery diarrhea lasting longer than 3 days  Pain Control The goal of pain control is to minimize pain, keep you moving and help you heal. Your surgical team will work with you on your pain plan. Most often a combination of therapies and medications are used to control your pain. You may also be given medication (local anesthetic) at the surgical site. This may help control your pain for several days. Extreme pain puts extra stress on your body at a time when your body needs to focus on healing. Do not wait until your pain has reached a level "10" or is unbearable before telling your doctor or nurse. It is much easier to control pain before it becomes severe. Following a laparoscopic procedure, pain is sometimes felt in the shoulder. This is due to the gas inserted into your abdomen during the procedure. Moving and walking helps to decrease the gas and the right shoulder pain.  Use the guide below for ways to manage your post-operative pain. Learn more by going to facs.org/safepaincontrol.  How Intense Is My Pain Common Therapies to Feel Better       I hardly notice my pain, and it does not interfere with my activities.  I notice my pain and it distracts me, but I can still do activities (sitting up, walking, standing).  Non-Medication Therapies  Ice (in a bag, applied over clothing at the surgical site), elevation, rest, meditation, massage, distraction (music, TV, play) walking and mild exercise Splinting the abdomen with pillows +  Non-Opioid Medications Acetaminophen (Tylenol) Non-steroidal anti-inflammatory drugs (NSAIDS) Aspirin, Ibuprofen (Motrin, Advil) Naproxen (Aleve) Take these as  needed, when you feel pain. Both acetaminophen and NSAIDs help to decrease pain and swelling (inflammation).      My pain is hard to ignore and is more noticeable even when I rest.  My pain interferes with my usual activities.  Non-Medication Therapies  +  Non-Opioid medications  Take on a regular schedule (around-the-clock) instead of as needed. (For example, Tylenol every 6 hours at 9:00 am, 3:00 pm, 9:00 pm, 3:00 am and Motrin every 6 hours at 12:00 am, 6:00 am, 12:00 pm, 6:00 pm)         I am focused on my pain, and I am not doing my daily activities.  I am groaning in pain, and I cannot sleep. I am unable to do anything.  My pain is as bad as it could be, and nothing else matters.  Non-Medication Therapies  +  Around-the-Clock Non-Opioid Medications  +  Short-acting opioids  Opioids should be used with other medications to manage severe pain. Opioids block pain and give a feeling of euphoria (feel high). Addiction, a serious side effect of opioids, is   rare with short-term (a few days) use.  Examples of short-acting opioids include: Tramadol (Ultram), Hydrocodone (Norco, Vicodin), Hydromorphone (Dilaudid), Oxycodone (Oxycontin)     The above directions have been adapted from the American College of Surgeons Surgical Patient Education Program.  Please refer to the ACS website if needed: https://www.facs.org/-/media/files/education/patient-ed/[email or web address].ashx   Tiarah Shisler, MD [facility] [address], [address], [place], [place]  [address] ?  [address] [address], [place], [place]   [address] [phone] ? [phone] ? FAX [phone] Web site: [email or web address]  

## 2022-10-15 NOTE — H&P (Signed)
Admitting Physician: Hyman Hopes Quintyn Dombek  Service: General Surgery  CC: Hernia  Subjective   HPI: Vincent Flores is an 72 y.o. male who is here for hernia  Past Medical History:  Diagnosis Date   Arthritis    CAD (coronary artery disease)    a. NSTEMI 10/16: mid LAD 100% >> PCI with Synergy DES   HTN (hypertension)    Hyperlipidemia    Ischemic cardiomyopathy    a. Echo 10/16: Moderate LVH, EF 35-40%, anteroseptal akinesis, grade 1 diastolic;  b. Echo 1/17: mild LVH, EF 60-65%, no RWMA, Gr 1 DD.   Myocardial infarction [facility])    Pre-diabetes    Sleep apnea    has cpap    Past Surgical History:  Procedure Laterality Date   APPENDECTOMY  1967   APPENDECTOMY     CARDIAC CATHETERIZATION N/A 10/29/2014   Procedure: Left Heart Cath and Coronary Angiography;  Surgeon: Lyn Records, MD;  Location: [facility] INVASIVE CV LAB;  Service: Cardiovascular;  Laterality: N/A;   CARDIAC CATHETERIZATION N/A 10/29/2014   Procedure: Coronary Stent Intervention;  Surgeon: Lyn Records, MD;  Location: [facility] INVASIVE CV LAB;  Service: Cardiovascular;  Laterality: N/A;   CORONARY STENT PLACEMENT     INGUINAL HERNIA REPAIR     MOUTH SURGERY     TRANSTHORACIC ECHOCARDIOGRAM  11/08/2011   normal global wall motion.  normal systolic global function.  calculated EF 61 %    Family History  Problem Relation Age of Onset   Heart disease Mother    Cancer Father        Lung, was a smoker    Social:  reports that he quit smoking about 13 years ago. His smoking use included cigarettes. He has never used smokeless tobacco. He reports current alcohol use of about 21.0 standard drinks of alcohol per week. He reports that he does not use drugs.  Allergies: No Known Allergies  Medications: Current Outpatient Medications  Medication Instructions   aspirin EC 81 mg, Oral, Daily   atorvastatin (LIPITOR) 80 mg, Oral, Daily   carvedilol (COREG) 6.25 mg, Oral, 2 times daily with meals   clopidogrel (PLAVIX) 75  mg, Oral, Daily   diphenhydramine-acetaminophen (TYLENOL PM) 25-500 MG TABS tablet 2 tablets, Oral, At bedtime PRN   isosorbide mononitrate (IMDUR) 60 MG 24 hr tablet TAKE 1 TABLET BY MOUTH EVERY DAY   lisinopril (ZESTRIL) 5 mg, Oral, Daily   nitroGLYCERIN (NITROSTAT) 0.4 mg, Sublingual, Every 5 min x3 PRN   pantoprazole (PROTONIX) 40 MG tablet TAKE 1 TABLET BY MOUTH EVERY DAY    ROS - all of the below systems have been reviewed with the patient and positives are indicated with bold text General: chills, fever or night sweats Eyes: blurry vision or double vision ENT: epistaxis or sore throat Allergy/Immunology: itchy/watery eyes or nasal congestion Hematologic/Lymphatic: bleeding problems, blood clots or swollen lymph nodes Endocrine: temperature intolerance or unexpected weight changes Breast: new or changing breast lumps or nipple discharge Resp: cough, shortness of breath, or wheezing CV: chest pain or dyspnea on exertion GI: as per HPI GU: dysuria, trouble voiding, or hematuria MSK: joint pain or joint stiffness Neuro: TIA or stroke symptoms Derm: pruritus and skin lesion changes Psych: anxiety and depression  Objective   PE Blood pressure 129/74, pulse 62, temperature 98.2 F (36.8 C), temperature source Oral, resp. rate 16, height 5\' 6"  (1.676 m), weight 106.6 kg, SpO2 97%. Constitutional: NAD; conversant; no deformities Eyes: Moist conjunctiva; no lid lag; anicteric;  PERRL Neck: Trachea midline; no thyromegaly Lungs: Normal respiratory effort; no tactile fremitus CV: RRR; no palpable thrills; no pitting edema GI: Abd umbilical hernia; no palpable hepatosplenomegaly MSK: Normal range of motion of extremities; no clubbing/cyanosis Psychiatric: Appropriate affect; alert and oriented x3 Lymphatic: No palpable cervical or axillary lymphadenopathy  No results found for this or any previous visit (from the past 24 hour(s)).  Imaging Orders  No imaging studies ordered today      Assessment and Plan   Vincent Flores has a 2 cm x 2 cm umbilical hernia. I recommended open umbilical hernia repair with mesh. We discussed the procedure itself as well as its risk, benefits, and alternatives. After full discussion all questions answered the patient granted consent to proceed.   Quentin Ore, MD  [facility] Use [email or web address] to contact on call provider

## 2022-10-15 NOTE — Anesthesia Postprocedure Evaluation (Signed)
Anesthesia Post Note  Patient: Vincent Flores  Procedure(s) Performed: HERNIA REPAIR UMBILICAL ADULT WITH MESH     Patient location during evaluation: PACU Anesthesia Type: General Level of consciousness: awake and alert Pain management: pain level controlled Vital Signs Assessment: post-procedure vital signs reviewed and stable Respiratory status: spontaneous breathing, nonlabored ventilation, respiratory function stable and patient connected to nasal cannula oxygen Cardiovascular status: blood pressure returned to baseline and stable Postop Assessment: no apparent nausea or vomiting Anesthetic complications: no  No notable events documented.  Last Vitals:  Vitals:   10/15/22 1600 10/15/22 1615  BP: 124/68 134/73  Pulse: (!) 52 66  Resp: 16 16  Temp: 36.4 C 36.4 C  SpO2: 96% 95%    Last Pain:  Vitals:   10/15/22 1615  TempSrc: Oral  PainSc: 0-No pain                 Edithe Dobbin L Clements Toro

## 2022-10-15 NOTE — Op Note (Signed)
Patient: Vincent Flores ([birth year], [id number])  Date of Surgery: 10/15/22  Preoperative Diagnosis: UMBILICAL HERNIA 2cm x 2cm  Postoperative Diagnosis: UMBILICAL HERNIA 2cm x 2cm  Surgical Procedure: HERNIA REPAIR UMBILICAL ADULT WITH MESH:    Operative Team Members:  Surgeons and Role:    * Bettyjane Shenoy, Hyman Hopes, MD - Primary   Anesthesiologist: Elmer Picker, MD CRNA: Chinita Pester, CRNA; Pearson Grippe, CRNA   Anesthesia: General   Fluids:  Total I/O In: 100 [IV Piggyback:100] Out: 10 [Blood:10]  Complications: None  Drains:  none   Specimen: None   Disposition:  PACU - hemodynamically stable.  Plan of Care: Discharge to home after PACU    Indications for Procedure: [name] is a 72 y.o. male who presented with an  2 cm x 2 cm umbilical hernia. I recommended open umbilical hernia repair with mesh. We discussed the procedure itself as well as its risk, benefits, and alternatives. After full discussion all questions answered the patient granted consent to proceed.   Findings:  Hernia Location: Umbilical Hernia Size:  2 cm x 2 cm  Mesh Size &Type:  7.5 cm x 7.5cm Bard Soft mesh Mesh Position: Preperitoneal  Description of Procedure:  The patient was positioned supine, padded and secured to the bed.  The abdomen was widely prepped and draped.  A time out procedure was performed.   A curvilinear incision was made below the umbilicus and dissection was carried down through the subcutaneous tissue to the level of the fascia.  The umbilical stalk was encircled and the hernia sac amputated off the umbilical skin.  The hernia defect was measured as 2 cm wide by 2 cm in vertical dimension.    An umbilical hernia repair with mesh was performed.  The hernia sac was scored along the perimeter of the fascial defect, entering the pre peritoneal plane.  A wide pre peritoneal dissection was performed creating a space for mesh placement.  A piece of Bard Soft was  opened and trimmed to 7.5 cm x 7.5 cm and deployed into the pre peritoneal space.  The mesh laid in taut position in the pre peritoneal plane and did not require fixation.  The space was irrigated with normal saline.  The fascial defect was closed horizontally, overtop the mesh with figure of eight 2-0 PDS suture.  The umbilical skin was tacked down to the fascial repair with 4-0 Monocryl suture.  The skin was closed with 4-0 Monocryl subcuticular suture and skin glue.    Ivar Drape, MD General, Bariatric, & Minimally Invasive Surgery [facility] [place]

## 2022-10-15 NOTE — Anesthesia Procedure Notes (Signed)
Procedure Name: Intubation Date/Time: 10/15/2022 2:30 PM  Performed by: Pearson Grippe, CRNAPre-anesthesia Checklist: Patient identified, Emergency Drugs available, Suction available and Patient being monitored Patient Re-evaluated:Patient Re-evaluated prior to induction Oxygen Delivery Method: Circle system utilized Preoxygenation: Pre-oxygenation with 100% oxygen Induction Type: IV induction Ventilation: Mask ventilation without difficulty and Oral airway inserted - appropriate to patient size Laryngoscope Size: Hyacinth Meeker and 2 Grade View: Grade I Tube type: Oral Tube size: 7.5 mm Number of attempts: 1 Airway Equipment and Method: Stylet and Oral airway Placement Confirmation: ETT inserted through vocal cords under direct vision, positive ETCO2 and breath sounds checked- equal and bilateral Secured at: 22 cm Tube secured with: Tape Dental Injury: Teeth and Oropharynx as per pre-operative assessment

## 2022-10-16 ENCOUNTER — Encounter (HOSPITAL_COMMUNITY): Payer: Self-pay | Admitting: Surgery

## 2022-11-08 DIAGNOSIS — K429 Umbilical hernia without obstruction or gangrene: Secondary | ICD-10-CM | POA: Diagnosis not present

## 2022-11-08 DIAGNOSIS — Z9889 Other specified postprocedural states: Secondary | ICD-10-CM | POA: Diagnosis not present

## 2022-12-24 ENCOUNTER — Telehealth: Payer: Self-pay | Admitting: Cardiovascular Disease

## 2022-12-24 DIAGNOSIS — G4733 Obstructive sleep apnea (adult) (pediatric): Secondary | ICD-10-CM

## 2022-12-24 NOTE — Telephone Encounter (Signed)
Patient states he would like to switch CPAP suppliers to [facility] (phone#: [phone]) and he would like to have an order sent to them.

## 2022-12-24 NOTE — Telephone Encounter (Signed)
Spoke with patient and notified patient that prescriptions for CPAP and supplies has been sent to [facility] for continuity of care. Choice was previous DME.

## 2023-01-15 ENCOUNTER — Telehealth: Payer: Self-pay | Admitting: Cardiovascular Disease

## 2023-01-15 NOTE — Telephone Encounter (Signed)
 Patient stated he is following-up on getting the prescription for supplies for his CPAP machine sent to [facility].  Patient stated they have not received the prescription.

## 2023-01-16 NOTE — Addendum Note (Signed)
 Addended by: Brunetta Genera on: 01/16/2023 10:26 AM   Modules accepted: Orders

## 2023-01-16 NOTE — Telephone Encounter (Signed)
 Notified patient that [facility] has his CPAP order and will reach ou to him for confirmation. All questions were answered and patient verbalized understanding.

## 2023-02-25 DIAGNOSIS — M79643 Pain in unspecified hand: Secondary | ICD-10-CM | POA: Diagnosis not present

## 2023-02-25 DIAGNOSIS — M6530 Trigger finger, unspecified finger: Secondary | ICD-10-CM | POA: Diagnosis not present

## 2023-02-25 DIAGNOSIS — M79644 Pain in right finger(s): Secondary | ICD-10-CM | POA: Diagnosis not present

## 2023-02-25 DIAGNOSIS — M1990 Unspecified osteoarthritis, unspecified site: Secondary | ICD-10-CM | POA: Diagnosis not present

## 2023-02-25 DIAGNOSIS — M542 Cervicalgia: Secondary | ICD-10-CM | POA: Diagnosis not present

## 2023-03-04 DIAGNOSIS — G4733 Obstructive sleep apnea (adult) (pediatric): Secondary | ICD-10-CM | POA: Diagnosis not present

## 2023-03-11 DIAGNOSIS — M542 Cervicalgia: Secondary | ICD-10-CM | POA: Diagnosis not present

## 2023-03-11 DIAGNOSIS — M79643 Pain in unspecified hand: Secondary | ICD-10-CM | POA: Diagnosis not present

## 2023-03-11 DIAGNOSIS — M6530 Trigger finger, unspecified finger: Secondary | ICD-10-CM | POA: Diagnosis not present

## 2023-03-11 DIAGNOSIS — M79645 Pain in left finger(s): Secondary | ICD-10-CM | POA: Diagnosis not present

## 2023-03-11 DIAGNOSIS — M1990 Unspecified osteoarthritis, unspecified site: Secondary | ICD-10-CM | POA: Diagnosis not present

## 2023-04-01 DIAGNOSIS — G4733 Obstructive sleep apnea (adult) (pediatric): Secondary | ICD-10-CM | POA: Diagnosis not present

## 2023-04-03 DIAGNOSIS — G4733 Obstructive sleep apnea (adult) (pediatric): Secondary | ICD-10-CM | POA: Diagnosis not present

## 2023-04-18 ENCOUNTER — Other Ambulatory Visit: Payer: Self-pay | Admitting: Internal Medicine

## 2023-04-18 DIAGNOSIS — Z87891 Personal history of nicotine dependence: Secondary | ICD-10-CM

## 2023-04-22 ENCOUNTER — Ambulatory Visit
Admission: RE | Admit: 2023-04-22 | Discharge: 2023-04-22 | Disposition: A | Discharge status: Home / Self Care | Source: Ambulatory Visit | Attending: Internal Medicine | Admitting: Internal Medicine

## 2023-04-22 DIAGNOSIS — Z87891 Personal history of nicotine dependence: Secondary | ICD-10-CM

## 2023-04-22 DIAGNOSIS — Z122 Encounter for screening for malignant neoplasm of respiratory organs: Secondary | ICD-10-CM | POA: Diagnosis not present

## 2023-04-23 ENCOUNTER — Other Ambulatory Visit: Payer: Self-pay | Admitting: Cardiovascular Disease

## 2023-04-30 ENCOUNTER — Ambulatory Visit: Payer: PPO | Attending: Cardiovascular Disease | Admitting: Cardiovascular Disease

## 2023-04-30 ENCOUNTER — Encounter: Payer: Self-pay | Admitting: Cardiovascular Disease

## 2023-04-30 VITALS — BP 108/64 | HR 66 | Ht 66.0 in | Wt 220.6 lb

## 2023-04-30 DIAGNOSIS — I2510 Atherosclerotic heart disease of native coronary artery without angina pectoris: Secondary | ICD-10-CM

## 2023-04-30 DIAGNOSIS — G4733 Obstructive sleep apnea (adult) (pediatric): Secondary | ICD-10-CM | POA: Diagnosis not present

## 2023-04-30 DIAGNOSIS — K219 Gastro-esophageal reflux disease without esophagitis: Secondary | ICD-10-CM

## 2023-04-30 DIAGNOSIS — E782 Mixed hyperlipidemia: Secondary | ICD-10-CM | POA: Diagnosis not present

## 2023-04-30 DIAGNOSIS — I10 Essential (primary) hypertension: Secondary | ICD-10-CM

## 2023-04-30 NOTE — Patient Instructions (Signed)
 Medication Instructions:  NO CHANGES *If you need a refill on your cardiac medications before your next appointment, please call your pharmacy*  Lab Work: NO LABS If you have labs (blood work) drawn today and your tests are completely normal, you will receive your results only by: MyChart Message (if you have MyChart) OR A paper copy in the mail If you have any lab test that is abnormal or we need to change your treatment, we will call you to review the results.  Testing/Procedures: NO TESTING  Follow-Up: At [facility], you and your health needs are our priority.  As part of our continuing mission to provide you with exceptional heart care, our providers are all part of one team.  This team includes your primary Cardiologist (physician) and Advanced Practice Providers or APPs (Physician Assistants and Nurse Practitioners) who all work together to provide you with the care you need, when you need it.  Your next appointment:   7 month(s)  Provider:   Randene Bustard, MD  Other Instructions   1st Floor: - Lobby - Registration  - Pharmacy  - Lab - Cafe  2nd Floor: - PV Lab - Diagnostic Testing (echo, CT, nuclear med)  3rd Floor: - Vacant  4th Floor: - TCTS (cardiothoracic surgery) - AFib Clinic - Structural Heart Clinic - Vascular Surgery  - Vascular Ultrasound  5th Floor: - [facility] Card[facility] (general and EP) - Clinical Pharmacy for coumadin, hypertension, lipid, weight-loss medications, and med management appointments    Valet parking services will be available as well.
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
## 2023-05-02 ENCOUNTER — Encounter: Payer: Self-pay | Admitting: Cardiovascular Disease

## 2023-05-02 DIAGNOSIS — G4733 Obstructive sleep apnea (adult) (pediatric): Secondary | ICD-10-CM | POA: Diagnosis not present

## 2023-05-03 DIAGNOSIS — G4733 Obstructive sleep apnea (adult) (pediatric): Secondary | ICD-10-CM | POA: Diagnosis not present

## 2023-06-01 DIAGNOSIS — G4733 Obstructive sleep apnea (adult) (pediatric): Secondary | ICD-10-CM | POA: Diagnosis not present

## 2023-07-02 DIAGNOSIS — G4733 Obstructive sleep apnea (adult) (pediatric): Secondary | ICD-10-CM | POA: Diagnosis not present

## 2023-07-19 DIAGNOSIS — G4733 Obstructive sleep apnea (adult) (pediatric): Secondary | ICD-10-CM | POA: Diagnosis not present

## 2023-07-22 ENCOUNTER — Telehealth: Payer: Self-pay | Admitting: Acute Care

## 2023-07-22 ENCOUNTER — Ambulatory Visit: Admitting: Acute Care

## 2023-07-22 ENCOUNTER — Telehealth: Payer: Self-pay

## 2023-07-22 ENCOUNTER — Encounter: Payer: Self-pay | Admitting: Acute Care

## 2023-07-22 ENCOUNTER — Telehealth: Payer: Self-pay | Admitting: Cardiology

## 2023-07-22 ENCOUNTER — Encounter: Payer: Self-pay | Admitting: Emergency Medicine

## 2023-07-22 VITALS — BP 142/76 | HR 59 | Temp 98.2°F | Ht 66.0 in | Wt 220.8 lb

## 2023-07-22 DIAGNOSIS — Z87891 Personal history of nicotine dependence: Secondary | ICD-10-CM

## 2023-07-22 DIAGNOSIS — R911 Solitary pulmonary nodule: Secondary | ICD-10-CM | POA: Insufficient documentation

## 2023-07-22 DIAGNOSIS — Z7722 Contact with and (suspected) exposure to environmental tobacco smoke (acute) (chronic): Secondary | ICD-10-CM | POA: Diagnosis not present

## 2023-07-22 DIAGNOSIS — Z801 Family history of malignant neoplasm of trachea, bronchus and lung: Secondary | ICD-10-CM

## 2023-07-22 DIAGNOSIS — R9389 Abnormal findings on diagnostic imaging of other specified body structures: Secondary | ICD-10-CM

## 2023-07-22 NOTE — Telephone Encounter (Signed)
 Helping in preop today. Chart reviewed, per protocol can hold Plavix  x 5 days if no new symptoms. Given pharm only clearance, deferring formal VV but called pt to discuss. Got VM x2. LMTCB.

## 2023-07-22 NOTE — H&P (View-Only) (Signed)
 History of Present Illness Vincent Flores is a 73 y.o. male former smoker ( Quit 2011 with a 100 pack year smoking history) referred for lung nodule consult by Dr. Verdia for an abnormal lung cancer screening scan.He will be followed by Dr. Shelah.  07/22/2023 Pt. Presents for lung nodule consult after an abnormal lung cancer screening scan by his PCP.  Vincent Flores is a 73 year old male former heavy smoker  who presents for evaluation of an abnormal lung cancer screening scan. He was referred by Dr. Verdia for an abnormal lung cancer screening scan.  He has a history of a lung nodule in the superior segment of the left lower lobe, which has shown slow growth over time. The nodule measured 3 mm in 2016, increased to 7.9 mm in 2021, and is currently 11 mm. No unintentional weight loss or hemoptysis.  He is currently on Plavix  for a heart condition due to a stent placement and takes aspirin . He has a history of high blood pressure, emphysema, high cholesterol, and sleep apnea managed with CPAP. He underwent angioplasty and stent placement in 2016.  He quit smoking in 2011 after a significant smoking history of approximately 100 pack-years. He has a family history of emphysema and his father had lung cancer. 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  We have viewed his Lung Cancer Screening Scan together. We discussed that while the nodule has been slowly growing, it is now developing characteristics that are worrisome for lung cancer. We discussed watchful waiting with imaging vs Biopsy now. I explained I thought we should move forward with the biopsy. Pt. Is in agreement . He will need to hold his Plavix  x 5 days and his ASA x 48 hours. He verbalized understanding. We will get cardiology clearance for the Plavix  and ASA hold prior to the procedure.   He will need a super D CT Chest, as the  lung cancer screening scan was done in April. I have placed the order and requested it be completed today or tomorrow.   We have reviewed the risks and benefits of the bronchoscopy with biopsy procedure. Pt. Is in agreement to moving forward after informed consent .  Test Results: LDCT Chest 04/22/2023 Lung-RADS 4B, suspicious. Additional imaging evaluation or consultation with Pulmonology or Thoracic Surgery recommended. Nodule in the superior segment of the left lower lobe measuring 11 mm has not significantly changed in size from most recent exam, however demonstrates slow growth over serial exams and margins are slightly spiculated. Pulmonary or thoracic surgery consultation is recommended, consider PET-CT or tissue sampling. 2. No new pulmonary nodules. 3. Aortic Atherosclerosis (ICD10-I70.0) and Emphysema (ICD10-J43.9).      Latest Ref Rng & Units 10/01/2022    1:59 PM 08/20/2022    3:02 PM 07/09/2018   11:04 AM  CBC  WBC 4.0 - 10.5 K/uL 7.3  11.3  7.1   Hemoglobin 13.0 - 17.0 g/dL 85.6  85.4  84.9   Hematocrit 39.0 - 52.0 % 42.5  41.9  42.1   Platelets 150 - 400 K/uL 143  141  161        Latest Ref Rng & Units 10/01/2022    1:59 PM 08/20/2022    3:02 PM 07/09/2018   11:04 AM  BMP  Glucose 70 - 99 mg/dL 93  845  892   BUN 8 - 23 mg/dL 13  11  15   Creatinine 0.61 - 1.24 mg/dL 8.93  9.05  9.04   BUN/Creat Ratio 10 - 24   16   Sodium 135 - 145 mmol/L 138  138  140   Potassium 3.5 - 5.1 mmol/L 5.0  3.7  4.9   Chloride 98 - 111 mmol/L 104  101  102   CO2 22 - 32 mmol/L 27  24  22    Calcium  8.9 - 10.3 mg/dL 9.3  9.4  9.6     BNP    Component Value Date/Time   BNP 68.7 11/04/2014 0744    ProBNP No results found for: PROBNP  PFT No results found for: FEV1PRE, FEV1POST, FVCPRE, FVCPOST, TLC, DLCOUNC, PREFEV1FVCRT, PSTFEV1FVCRT  No results found.   Past medical hx Past Medical History:  Diagnosis Date   Arthritis    CAD (coronary artery disease)     a. NSTEMI 10/16: mid LAD 100% >> PCI with Synergy DES   HTN (hypertension)    Hyperlipidemia    Ischemic cardiomyopathy    a. Echo 10/16: Moderate LVH, EF 35-40%, anteroseptal akinesis, grade 1 diastolic;  b. Echo 1/17: mild LVH, EF 60-65%, no RWMA, Gr 1 DD.   Myocardial infarction (HCC)    Pre-diabetes    Sleep apnea    has cpap     Social History   Tobacco Use   Smoking status: Former    Current packs/day: 0.00    Types: Cigarettes    Quit date: 01/08/2009    Years since quitting: 14.5    Passive exposure: Past   Smokeless tobacco: Never   Tobacco comments:    Began smoking in 1967  Vaping Use   Vaping status: Never Used  Substance Use Topics   Alcohol use: Yes    Alcohol/week: 21.0 standard drinks of alcohol    Types: 21 Standard drinks or equivalent per week    Comment: one day per week   Drug use: No    Mr.Milhorn reports that he quit smoking about 14 years ago. His smoking use included cigarettes. He has been exposed to tobacco smoke. He has never used smokeless tobacco. He reports current alcohol use of about 21.0 standard drinks of alcohol per week. He reports that he does not use drugs.  Tobacco Cessation: Counseling given: Not Answered Tobacco comments: Began smoking in 1967 Former heavy smoker, quit 2011 with a 100 pack year smoking history  Past surgical hx, Family hx, Social hx all reviewed.  Current Outpatient Medications on File Prior to Visit  Medication Sig   aspirin  EC 81 MG tablet Take 81 mg by mouth daily.   atorvastatin  (LIPITOR ) 80 MG tablet Take 80 mg by mouth daily.   carvedilol  (COREG ) 6.25 MG tablet TAKE 1 TABLET BY MOUTH TWICE A DAY WITH FOOD   clopidogrel  (PLAVIX ) 75 MG tablet TAKE 1 TABLET BY MOUTH EVERY DAY   diphenhydramine-acetaminophen  (TYLENOL  PM) 25-500 MG TABS tablet Take 2 tablets by mouth at bedtime as needed (pain / sleep).   isosorbide  mononitrate (IMDUR ) 60 MG 24 hr tablet TAKE 1 TABLET BY MOUTH EVERY DAY   lisinopril   (ZESTRIL ) 10 MG tablet TAKE 1/2 TABLET BY MOUTH DAILY   nitroGLYCERIN  (NITROSTAT ) 0.4 MG SL tablet Place 1 tablet (0.4 mg total) under the tongue every 5 (five) minutes x 3 doses as needed for chest pain.   pantoprazole  (PROTONIX ) 40 MG tablet TAKE 1 TABLET BY MOUTH EVERY DAY   No current facility-administered medications on file prior to visit.  No Known Allergies  Review Of Systems:  Constitutional:   No  weight loss, night sweats,  Fevers, chills, fatigue, or  lassitude.  HEENT:   No headaches,  Difficulty swallowing,  Tooth/dental problems, or  Sore throat,                No sneezing, itching, ear ache, nasal congestion, post nasal drip,   CV:  No chest pain,  Orthopnea, PND, swelling in lower extremities, anasarca, dizziness, palpitations, syncope.   GI  No heartburn, indigestion, abdominal pain, nausea, vomiting, diarrhea, change in bowel habits, loss of appetite, bloody stools.   Resp: No shortness of breath with exertion or at rest.  No excess mucus, no productive cough,  No non-productive cough,  No coughing up of blood.  No change in color of mucus.  No wheezing.  No chest wall deformity  Skin: no rash or lesions.  GU: no dysuria, change in color of urine, no urgency or frequency.  No flank pain, no hematuria   MS:  No joint pain or swelling.  No decreased range of motion.  No back pain.  Psych:  No change in mood or affect. No depression or anxiety.  No memory loss.   Vital Signs BP (!) 142/76 (BP Location: Left Arm, Patient Position: Sitting, Cuff Size: Normal)   Pulse (!) 59   Temp 98.2 F (36.8 C) (Oral)   Ht 5' 6 (1.676 m)   Wt 220 lb 12.8 oz (100.2 kg)   SpO2 96%   BMI 35.64 kg/m    Physical Exam:  General- No distress,  A&Ox3, pleasant ENT: No sinus tenderness, TM clear, pale nasal mucosa, no oral exudate,no post nasal drip, no LAN Cardiac: S1, S2, regular rate and rhythm, no murmur Chest: No wheeze/ rales/ dullness; no accessory muscle use, no  nasal flaring, no sternal retractions, slightly diminished per bases Abd.: Soft Non-tender, ND, BS +, Body mass index is 35.64 kg/m.  Ext: No clubbing cyanosis, edema, no obvious deformities Neuro:  normal strength, MAE x 4, A&O x 3, Appropriate Skin: No rashes, warm and dry, No obvious skin lesion  Psych: normal mood and behavior   Assessment/Plan Slowly growing lung nodule with spiculation noted pn lung cancer screening Former heavy smoker Family history of lung cancer in father ( Smoker) Plan It is good to see you today. We have looked at your lung cancer screening CT Chest.  You have a lung nodule that has been slowly growing and now is developing some characteristics that are concerning for lung cancer. We discussed that we could watch and wait or go ahead and do a biopsy to get a definitive diagnosis. I have placed an order for a bronchoscopy with biopsies.  We have discussed the procedure in detail.  We have reviewed the risks and benefits of the procedure. These include bleeding, infection, puncture of the lung, and adverse reaction to anesthesia. You have agreed to proceed with biopsy to evaluate the left lower lobe nodule. Your procedure will be done by Dr. Lamar Chris. You will receive a letter today with date time and information pertaining to the procedure. You will need someone to drive you to the procedure, stay with you during the procedure, and stay with you after the procedure. You will also need someone to stay with you for 24 hours after anesthesia to ensure you have cleared and are doing well. You will follow-up with me 1 week after the procedure to review the results and to ensure  you are doing well. Call if you need us  prior to the procedure or if you have any questions at all. Please contact office for sooner follow up if symptoms do not improve or worsen or seek emergency care    Hold Plavix  x 5 days prior to procedure.  Hold Plavix  after last dose tomorrow  7/15 Hold Aspirin  2 days prior to procedure  Hold 19, 20 and of course day of procedure.    I spent 30 minutes dedicated to the care of this patient on the date of this encounter to include pre-visit review of records, face-to-face time with the patient discussing conditions above, post visit ordering of testing, clinical documentation with the electronic health record, making appropriate referrals as documented, and communicating necessary information to the patient's healthcare team.    Lauraine JULIANNA Lites, NP 07/22/2023  11:01 AM

## 2023-07-22 NOTE — Telephone Encounter (Signed)
 Please schedule the following:  Provider performing procedure: Byrum  Diagnosis:  Lung nodule slowly growing Which side if for nodule / mass?  Left Procedure:  Navigational bronchoscopy with biopsies  Has patient been spoken to by Provider and given informed consent?  Yes, to Lauraine Lites NP Anesthesia:  General Do you need Fluro?  Yes Duration of procedure:  1.5 hrs Date: 07/29/2023 Alternate Date: 07/30/2023  Time: Needs PM so his family member can be with him Location:  [place]  Does patient have OSA? Yes, on CPAP DM?  No Or Latex allergy?  No Medication Restriction/ Anticoagulate/Antiplatelet:  Yes, Plavix  and ASA. Will need to stop Plavix  5 days prior to procedure, and aspirin  2 days prior to procedure Pre-op Labs Ordered:determined by Anesthesia Imaging request: Yes, Super D ordered  (If, SuperDimension CT Chest, please have STAT courier sent to ENDO)

## 2023-07-22 NOTE — Telephone Encounter (Signed)
   Pre-operative Risk Assessment    Patient Name: Vincent Flores  DOB: [birth year] MRN: [id number]   Date of last office visit: 04/30/23 w/ [place]ard Date of next office visit: nothing scheduled (recall for [facility] 12/2023)   Request for Surgical Clearance    Procedure:  Bronchoscopy with Biopsy   Date of Surgery:  Clearance 07/29/23                                Surgeon:  Dr. Shelah  Surgeon's Group or Practice Name:  [facility]  Phone number:  [phone] Fax number:  [phone]    Type of Clearance Requested:   Pharmacy - Plavix  (5 days prior)    Type of Anesthesia:  General    Additional requests/questions:    Vincent Flores   07/22/2023, 11:18 AM

## 2023-07-22 NOTE — Telephone Encounter (Signed)
 Spoke with Chloe at [facility] to put pharmacy clearance request for plavix  to be held 5 days prior to bronchoscopy with biopsy

## 2023-07-22 NOTE — Telephone Encounter (Signed)
 Patient scheduled letter given by the nurse [phone] Case send to [facility] to [place]

## 2023-07-22 NOTE — Progress Notes (Signed)
 History of Present Illness Vincent Flores is a 73 y.o. male former smoker ( Quit 2011 with a 100 pack year smoking history) referred for lung nodule consult by Dr. Verdia for an abnormal lung cancer screening scan.He will be followed by Dr. Shelah.  07/22/2023 Pt. Presents for lung nodule consult after an abnormal lung cancer screening scan by his PCP.  Vincent Flores is a 73 year old male former heavy smoker  who presents for evaluation of an abnormal lung cancer screening scan. He was referred by Dr. Verdia for an abnormal lung cancer screening scan.  He has a history of a lung nodule in the superior segment of the left lower lobe, which has shown slow growth over time. The nodule measured 3 mm in 2016, increased to 7.9 mm in 2021, and is currently 11 mm. No unintentional weight loss or hemoptysis.  He is currently on Plavix  for a heart condition due to a stent placement and takes aspirin . He has a history of high blood pressure, emphysema, high cholesterol, and sleep apnea managed with CPAP. He underwent angioplasty and stent placement in 2016.  He quit smoking in 2011 after a significant smoking history of approximately 100 pack-years. He has a family history of emphysema and his father had lung cancer. 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  We have viewed his Lung Cancer Screening Scan together. We discussed that while the nodule has been slowly growing, it is now developing characteristics that are worrisome for lung cancer. We discussed watchful waiting with imaging vs Biopsy now. I explained I thought we should move forward with the biopsy. Pt. Is in agreement . He will need to hold his Plavix  x 5 days and his ASA x 48 hours. He verbalized understanding. We will get cardiology clearance for the Plavix  and ASA hold prior to the procedure.   He will need a super D CT Chest, as the  lung cancer screening scan was done in April. I have placed the order and requested it be completed today or tomorrow.   We have reviewed the risks and benefits of the bronchoscopy with biopsy procedure. Pt. Is in agreement to moving forward after informed consent .  Test Results: LDCT Chest 04/22/2023 Lung-RADS 4B, suspicious. Additional imaging evaluation or consultation with Pulmonology or Thoracic Surgery recommended. Nodule in the superior segment of the left lower lobe measuring 11 mm has not significantly changed in size from most recent exam, however demonstrates slow growth over serial exams and margins are slightly spiculated. Pulmonary or thoracic surgery consultation is recommended, consider PET-CT or tissue sampling. 2. No new pulmonary nodules. 3. Aortic Atherosclerosis (ICD10-I70.0) and Emphysema (ICD10-J43.9).      Latest Ref Rng & Units 10/01/2022    1:59 PM 08/20/2022    3:02 PM 07/09/2018   11:04 AM  CBC  WBC 4.0 - 10.5 K/uL 7.3  11.3  7.1   Hemoglobin 13.0 - 17.0 g/dL 85.6  85.4  84.9   Hematocrit 39.0 - 52.0 % 42.5  41.9  42.1   Platelets 150 - 400 K/uL 143  141  161        Latest Ref Rng & Units 10/01/2022    1:59 PM 08/20/2022    3:02 PM 07/09/2018   11:04 AM  BMP  Glucose 70 - 99 mg/dL 93  845  892   BUN 8 - 23 mg/dL 13  11  15   Creatinine 0.61 - 1.24 mg/dL 8.93  9.05  9.04   BUN/Creat Ratio 10 - 24   16   Sodium 135 - 145 mmol/L 138  138  140   Potassium 3.5 - 5.1 mmol/L 5.0  3.7  4.9   Chloride 98 - 111 mmol/L 104  101  102   CO2 22 - 32 mmol/L 27  24  22    Calcium  8.9 - 10.3 mg/dL 9.3  9.4  9.6     BNP    Component Value Date/Time   BNP 68.7 11/04/2014 0744    ProBNP No results found for: PROBNP  PFT No results found for: FEV1PRE, FEV1POST, FVCPRE, FVCPOST, TLC, DLCOUNC, PREFEV1FVCRT, PSTFEV1FVCRT  No results found.   Past medical hx Past Medical History:  Diagnosis Date   Arthritis    CAD (coronary artery disease)     a. NSTEMI 10/16: mid LAD 100% >> PCI with Synergy DES   HTN (hypertension)    Hyperlipidemia    Ischemic cardiomyopathy    a. Echo 10/16: Moderate LVH, EF 35-40%, anteroseptal akinesis, grade 1 diastolic;  b. Echo 1/17: mild LVH, EF 60-65%, no RWMA, Gr 1 DD.   Myocardial infarction (HCC)    Pre-diabetes    Sleep apnea    has cpap     Social History   Tobacco Use   Smoking status: Former    Current packs/day: 0.00    Types: Cigarettes    Quit date: 01/08/2009    Years since quitting: 14.5    Passive exposure: Past   Smokeless tobacco: Never   Tobacco comments:    Began smoking in 1967  Vaping Use   Vaping status: Never Used  Substance Use Topics   Alcohol use: Yes    Alcohol/week: 21.0 standard drinks of alcohol    Types: 21 Standard drinks or equivalent per week    Comment: one day per week   Drug use: No    Mr.Milhorn reports that he quit smoking about 14 years ago. His smoking use included cigarettes. He has been exposed to tobacco smoke. He has never used smokeless tobacco. He reports current alcohol use of about 21.0 standard drinks of alcohol per week. He reports that he does not use drugs.  Tobacco Cessation: Counseling given: Not Answered Tobacco comments: Began smoking in 1967 Former heavy smoker, quit 2011 with a 100 pack year smoking history  Past surgical hx, Family hx, Social hx all reviewed.  Current Outpatient Medications on File Prior to Visit  Medication Sig   aspirin  EC 81 MG tablet Take 81 mg by mouth daily.   atorvastatin  (LIPITOR ) 80 MG tablet Take 80 mg by mouth daily.   carvedilol  (COREG ) 6.25 MG tablet TAKE 1 TABLET BY MOUTH TWICE A DAY WITH FOOD   clopidogrel  (PLAVIX ) 75 MG tablet TAKE 1 TABLET BY MOUTH EVERY DAY   diphenhydramine-acetaminophen  (TYLENOL  PM) 25-500 MG TABS tablet Take 2 tablets by mouth at bedtime as needed (pain / sleep).   isosorbide  mononitrate (IMDUR ) 60 MG 24 hr tablet TAKE 1 TABLET BY MOUTH EVERY DAY   lisinopril   (ZESTRIL ) 10 MG tablet TAKE 1/2 TABLET BY MOUTH DAILY   nitroGLYCERIN  (NITROSTAT ) 0.4 MG SL tablet Place 1 tablet (0.4 mg total) under the tongue every 5 (five) minutes x 3 doses as needed for chest pain.   pantoprazole  (PROTONIX ) 40 MG tablet TAKE 1 TABLET BY MOUTH EVERY DAY   No current facility-administered medications on file prior to visit.  No Known Allergies  Review Of Systems:  Constitutional:   No  weight loss, night sweats,  Fevers, chills, fatigue, or  lassitude.  HEENT:   No headaches,  Difficulty swallowing,  Tooth/dental problems, or  Sore throat,                No sneezing, itching, ear ache, nasal congestion, post nasal drip,   CV:  No chest pain,  Orthopnea, PND, swelling in lower extremities, anasarca, dizziness, palpitations, syncope.   GI  No heartburn, indigestion, abdominal pain, nausea, vomiting, diarrhea, change in bowel habits, loss of appetite, bloody stools.   Resp: No shortness of breath with exertion or at rest.  No excess mucus, no productive cough,  No non-productive cough,  No coughing up of blood.  No change in color of mucus.  No wheezing.  No chest wall deformity  Skin: no rash or lesions.  GU: no dysuria, change in color of urine, no urgency or frequency.  No flank pain, no hematuria   MS:  No joint pain or swelling.  No decreased range of motion.  No back pain.  Psych:  No change in mood or affect. No depression or anxiety.  No memory loss.   Vital Signs BP (!) 142/76 (BP Location: Left Arm, Patient Position: Sitting, Cuff Size: Normal)   Pulse (!) 59   Temp 98.2 F (36.8 C) (Oral)   Ht 5' 6 (1.676 m)   Wt 220 lb 12.8 oz (100.2 kg)   SpO2 96%   BMI 35.64 kg/m    Physical Exam:  General- No distress,  A&Ox3, pleasant ENT: No sinus tenderness, TM clear, pale nasal mucosa, no oral exudate,no post nasal drip, no LAN Cardiac: S1, S2, regular rate and rhythm, no murmur Chest: No wheeze/ rales/ dullness; no accessory muscle use, no  nasal flaring, no sternal retractions, slightly diminished per bases Abd.: Soft Non-tender, ND, BS +, Body mass index is 35.64 kg/m.  Ext: No clubbing cyanosis, edema, no obvious deformities Neuro:  normal strength, MAE x 4, A&O x 3, Appropriate Skin: No rashes, warm and dry, No obvious skin lesion  Psych: normal mood and behavior   Assessment/Plan Slowly growing lung nodule with spiculation noted pn lung cancer screening Former heavy smoker Family history of lung cancer in father ( Smoker) Plan It is good to see you today. We have looked at your lung cancer screening CT Chest.  You have a lung nodule that has been slowly growing and now is developing some characteristics that are concerning for lung cancer. We discussed that we could watch and wait or go ahead and do a biopsy to get a definitive diagnosis. I have placed an order for a bronchoscopy with biopsies.  We have discussed the procedure in detail.  We have reviewed the risks and benefits of the procedure. These include bleeding, infection, puncture of the lung, and adverse reaction to anesthesia. You have agreed to proceed with biopsy to evaluate the left lower lobe nodule. Your procedure will be done by Dr. Lamar Chris. You will receive a letter today with date time and information pertaining to the procedure. You will need someone to drive you to the procedure, stay with you during the procedure, and stay with you after the procedure. You will also need someone to stay with you for 24 hours after anesthesia to ensure you have cleared and are doing well. You will follow-up with me 1 week after the procedure to review the results and to ensure  you are doing well. Call if you need us  prior to the procedure or if you have any questions at all. Please contact office for sooner follow up if symptoms do not improve or worsen or seek emergency care    Hold Plavix  x 5 days prior to procedure.  Hold Plavix  after last dose tomorrow  7/15 Hold Aspirin  2 days prior to procedure  Hold 19, 20 and of course day of procedure.    I spent 30 minutes dedicated to the care of this patient on the date of this encounter to include pre-visit review of records, face-to-face time with the patient discussing conditions above, post visit ordering of testing, clinical documentation with the electronic health record, making appropriate referrals as documented, and communicating necessary information to the patient's healthcare team.    Lauraine JULIANNA Lites, NP 07/22/2023  11:01 AM

## 2023-07-22 NOTE — Patient Instructions (Addendum)
 It is good to see you today. We have looked at your lung cancer screening CT Chest.  You have a lung nodule that has been slowly growing and now is developing some characteristics that are concerning for lung cancer. We discussed that we could watch and wait or go ahead and do a biopsy to get a definitive diagnosis. I have placed an order for a bronchoscopy with biopsies.  We have discussed the procedure in detail.  We have reviewed the risks and benefits of the procedure. These include bleeding, infection, puncture of the lung, and adverse reaction to anesthesia. You have agreed to proceed with biopsy to evaluate the left lower lobe nodule. Your procedure will be done by Dr. Lamar Chris. You will receive a letter today with date time and information pertaining to the procedure. You will need someone to drive you to the procedure, stay with you during the procedure, and stay with you after the procedure. You will also need someone to stay with you for 24 hours after anesthesia to ensure you have cleared and are doing well. You will follow-up with me 1 week after the procedure to review the results and to ensure you are doing well. Call if you need us  prior to the procedure or if you have any questions at all. Please contact office for sooner follow up if symptoms do not improve or worsen or seek emergency care    Hold Plavix  x 5 days prior to procedure.  Hold Plavix  after last dose tomorrow 7/15 Hold Aspirin  2 days prior to procedure  Hold 19, 20 and of course day of procedure.

## 2023-07-23 ENCOUNTER — Ambulatory Visit (HOSPITAL_COMMUNITY)
Admission: RE | Admit: 2023-07-23 | Discharge: 2023-07-23 | Disposition: A | Discharge status: Home / Self Care | Source: Ambulatory Visit | Attending: Acute Care | Admitting: Acute Care

## 2023-07-23 DIAGNOSIS — R911 Solitary pulmonary nodule: Secondary | ICD-10-CM | POA: Insufficient documentation

## 2023-07-23 DIAGNOSIS — I700 Atherosclerosis of aorta: Secondary | ICD-10-CM | POA: Diagnosis not present

## 2023-07-25 ENCOUNTER — Other Ambulatory Visit: Payer: Self-pay

## 2023-07-25 ENCOUNTER — Encounter (HOSPITAL_COMMUNITY): Payer: Self-pay | Admitting: Emergency Medicine

## 2023-07-25 ENCOUNTER — Other Ambulatory Visit: Payer: Self-pay | Admitting: Cardiovascular Disease

## 2023-07-25 NOTE — Telephone Encounter (Signed)
 Pt returning nurse call

## 2023-07-25 NOTE — Progress Notes (Signed)
 SDW call  Patient was given pre-op instructions over the phone. Patient verbalized understanding of instructions provided.     PCP - Dr. Lequita Flor Cardiologist - Dr. Debby Sor Pulmonary:    PPM/ICD - denies Device Orders - na Rep Notified - na   Chest x-ray - 08/20/2022 EKG -  04/30/2023 Stress Test - 07/19/2017 ECHO - 02/07/2015 Cardiac Cath - 10/29/2014  Sleep Study/sleep apnea/CPAP: OSA with nightly CPAP  Non-diabetic  Blood Thinner Instructions: Plavix , hold 5 days, states last dose 07/23/2023 Aspirin  Instructions: hold 2 days, last dose 07/26/2023   ERAS Protcol - NPO   Anesthesia review: Prentiss. HTN, MI, CAD, OSA with nightly CPAP   Patient denies shortness of breath, fever, cough and chest pain over the phone call  Your procedure is scheduled on Monday July 29, 2023  Report to [facility] Main Entrance A at 1200 P.M. , then check in with the Admitting office.  Call this number if you have problems the morning of surgery:  [phone]   If you have any questions prior to your surgery date call [phone]: Open Monday-Friday 8am-4pm If you experience any cold or flu symptoms such as cough, fever, chills, shortness of breath, etc. between now and your scheduled surgery, please notify us  at the above number    Remember:  Do not eat or drink after midnight the night before your surgery  Take these medicines the morning of surgery with A SIP OF WATER:  Atorvastatin , carvedilol , ososorbide, protonix   As of today, STOP taking any Aleve, Naproxen, Ibuprofen, Motrin, Advil, Goody's, BC's, all herbal medications, fish oil, and all vitamins.

## 2023-07-26 NOTE — Telephone Encounter (Signed)
 Patient informed and expressed understanding of preop and periop measures. Pt states that he has stopped his Plavix  for the advised period and will resume as directed postop.

## 2023-07-26 NOTE — Anesthesia Preprocedure Evaluation (Addendum)
 Anesthesia Evaluation  Patient identified by MRN, date of birth, ID band Patient awake    Reviewed: Allergy & Precautions, NPO status   Airway Mallampati: II  TM Distance: >3 FB Neck ROM: Full    Dental no notable dental hx.    Pulmonary sleep apnea and Continuous Positive Airway Pressure Ventilation , former smoker   Pulmonary exam normal        Cardiovascular hypertension, Pt. on medications and Pt. on home beta blockers + CAD, + Past MI and + Cardiac Stents   Rhythm:Regular Rate:Normal     Neuro/Psych negative neurological ROS  negative psych ROS   GI/Hepatic Neg liver ROS,GERD  ,,  Endo/Other  negative endocrine ROS    Renal/GU negative Renal ROS  negative genitourinary   Musculoskeletal  (+) Arthritis , Osteoarthritis,    Abdominal Normal abdominal exam  (+)   Peds  Hematology Lab Results      Component                Value               Date                      WBC                      7.0                 07/29/2023                HGB                      14.5                07/29/2023                HCT                      42.4                07/29/2023                MCV                      98.1                07/29/2023                PLT                      136 (L)             07/29/2023              Anesthesia Other Findings   Reproductive/Obstetrics                              Anesthesia Physical Anesthesia Plan  ASA: 3  Anesthesia Plan: General   Post-op Pain Management:    Induction: Intravenous  PONV Risk Score and Plan: Dexamethasone  and Treatment may vary due to age or medical condition  Airway Management Planned: Mask and Oral ETT  Additional Equipment: None  Intra-op Plan:   Post-operative Plan: Extubation in OR  Informed Consent: I have reviewed the patients History and Physical, chart, labs and discussed the procedure including the risks,  benefits and alternatives for  the proposed anesthesia with the patient or authorized representative who has indicated his/her understanding and acceptance.     Dental advisory given  Plan Discussed with: CRNA  Anesthesia Plan Comments: (PAT note written 07/26/2023 by Allison Zelenak, PA-C.  )         Anesthesia Quick Evaluation

## 2023-07-26 NOTE — Progress Notes (Signed)
 Anesthesia Chart Review: Vincent Flores  Case: [id number] Date/Time: 07/29/23 1245   Procedure: VIDEO BRONCHOSCOPY WITH ENDOBRONCHIAL NAVIGATION (Left)   Anesthesia type: General   Diagnosis: Lung nodule seen on imaging study [R91.1]   Pre-op diagnosis: lung nodule   [facility] / [facility]   Surgeons: Shelah Lamar RAMAN, MD       DISCUSSION: Patient is a 73 year old male scheduled for the above procedure.  History includes former smoker (quit 2011), HTN, HLD, CAD (NSTEMI, s/p DES LAD 10/29/14), ischemic cardiomyopathy, OSA (uses CPAP), pre-diabetes.   Preoperative cardiology input per Trudy Birmingham, PA-C on 07/1823, Per office protocol, if patient is without any new symptoms or concerns, he/she may hold Plavix  for 5 days prior to procedure. Please resume Plavix  as soon as possible postprocedure, at the discretion of the surgeon. We recommend patient take Aspirin  81 mg daily throughout the perioperative period unless advised otherwise by surgical team.  Please discontinue Aspirin  upon resumption of Plavix . He reported last Plavix  07/1523 and last ASA planned for 07/26/23.   VS: Ht 5' 6 (1.676 m)   Wt 99.8 kg   BMI 35.51 kg/m   BP Readings from Last 3 Encounters:  07/22/23 (!) 142/76  04/30/23 108/64  10/15/22 134/73   Pulse Readings from Last 3 Encounters:  07/22/23 (!) 59  04/30/23 66  10/15/22 66     PROVIDERS: Verdia Lombard, MD is PCP  Burnard Ned, MD is cardiologist, last visit 04/30/23. Aryal, Govinda, MD is rheumatologist   LABS: For day of surgery as indicated. As of 10/01/22, Cr 1.06, H/H 14.3/42.5, PLT 143K.   IMAGES: CT Chest Super D  07/23/23: In process.   CT Chest LCS 04/22/23: IMPRESSION: 1. Lung-RADS 4B, suspicious. Additional imaging evaluation or consultation with Pulmonology or Thoracic Surgery recommended. Nodule in the superior segment of the left lower lobe measuring 11 mm has not significantly changed in size from  most recent exam, however demonstrates slow growth over serial exams and margins are slightly spiculated. Pulmonary or thoracic surgery consultation is recommended, consider PET-CT or tissue sampling. 2. No new pulmonary nodules. 3. Aortic Atherosclerosis (ICD10-I70.0) and Emphysema (ICD10-J43.9).    EKG: 04/30/23: NSR   CV: Myocardial Perfusion 07/19/2017: Nuclear stress EF: 55%. There was no ST segment deviation noted during stress. No T wave inversion was noted during stress. Defect 1: There is a small defect of mild severity present in the apex location. This is a low risk study.   There is a tiny apical fixed defect, consider apical thinning artifact versus small scar of previous LAD territory infarction. Low risk stress nuclear study with otherwise normal perfusion and normal left ventricular regional and global systolic function.   TTE 02/07/2015: Impressions:  - LVEF 60-65%, mild LVH, normal wall motion, diastolic dysfunction,    indeterminate LV filling pressure, normal LA size, normal IVC.    Cardiac cath 10/29/2014: Mid RCA lesion, 45% stenosed. Dist RCA lesion, 60% stenosed. 1st Mrg lesion, 60% stenosed. Ost 1st Mrg lesion, 50% stenosed. Prox Cx to Dist Cx lesion, 30% stenosed. Mid LAD lesion, 100% stenosed.   Non-ST elevation myocardial infarction with ongoing chest discomfort on presentation and elevated markers. Total occlusion of the proximal to mid LAD with both right to left and left to left collaterals to the LAD, producing the non-STEMI presentation rather than ST elevation infarction. Successful PTCA and stenting of the LAD from 100% to 0% with TIMI grade 3 flow. Final post PCI balloon diameter  3.25 mm.  Moderate mid and distal right coronary, and first obtuse marginal. Left ventricular systolic dysfunction with apical moderate hypokinesis. EF 50%.    Past Medical History:  Diagnosis Date   Arthritis    CAD (coronary artery disease)    a. NSTEMI  10/16: mid LAD 100% >> PCI with Synergy DES   HTN (hypertension)    Hyperlipidemia    Ischemic cardiomyopathy    a. Echo 10/16: Moderate LVH, EF 35-40%, anteroseptal akinesis, grade 1 diastolic;  b. Echo 1/17: mild LVH, EF 60-65%, no RWMA, Gr 1 DD.   Myocardial infarction [facility])    Pre-diabetes    Sleep apnea    has cpap    Past Surgical History:  Procedure Laterality Date   APPENDECTOMY  1967   APPENDECTOMY     CARDIAC CATHETERIZATION N/A 10/29/2014   Procedure: Left Heart Cath and Coronary Angiography;  Surgeon: Victory LELON Sharps, MD;  Location: [facility] INVASIVE CV LAB;  Service: Cardiovascular;  Laterality: N/A;   CARDIAC CATHETERIZATION N/A 10/29/2014   Procedure: Coronary Stent Intervention;  Surgeon: Victory LELON Sharps, MD;  Location: [facility] INVASIVE CV LAB;  Service: Cardiovascular;  Laterality: N/A;   CORONARY STENT PLACEMENT     INGUINAL HERNIA REPAIR     MOUTH SURGERY     TRANSTHORACIC ECHOCARDIOGRAM  11/08/2011   normal global wall motion.  normal systolic global function.  calculated EF 61 %   UMBILICAL HERNIA REPAIR N/A 10/15/2022   Procedure: HERNIA REPAIR UMBILICAL ADULT WITH MESH;  Surgeon: Stechschulte, Deward PARAS, MD;  Location: [facility] ORS;  Service: General;  Laterality: N/A;    MEDICATIONS: No current facility-administered medications for this encounter.    aspirin  EC 81 MG tablet   atorvastatin  (LIPITOR ) 80 MG tablet   carvedilol  (COREG ) 6.25 MG tablet   clopidogrel  (PLAVIX ) 75 MG tablet   diphenhydramine-acetaminophen  (TYLENOL  PM) 25-500 MG TABS tablet   isosorbide  mononitrate (IMDUR ) 60 MG 24 hr tablet   lisinopril  (ZESTRIL ) 10 MG tablet   nitroGLYCERIN  (NITROSTAT ) 0.4 MG SL tablet   pantoprazole  (PROTONIX ) 40 MG tablet    Isaiah Ruder, PA-C Surgical Short Stay/Anesthesiology [facility] Phone [phone] [facility] Phone [phone] 07/26/2023 1:20 PM

## 2023-07-26 NOTE — Progress Notes (Signed)
 Pt aware of new arrival time for sugery 1015.

## 2023-07-26 NOTE — Telephone Encounter (Signed)
   Patient Name: Vincent Flores  DOB: [birth year] MRN: [id number]  Primary Cardiologist: Debby Sor, MD  Chart reviewed as part of pre-operative protocol coverage.   Per office protocol, if patient is without any new symptoms or concerns, he/she may hold Plavix  for 5 days prior to procedure. Please resume Plavix  as soon as possible postprocedure, at the discretion of the surgeon. We recommend patient take Aspirin  81 mg daily throughout the perioperative period unless advised otherwise by surgical team.  Please discontinue Aspirin  upon resumption of Plavix .    Artist Pouch, PA-C 07/26/2023, 8:53 AM

## 2023-07-29 ENCOUNTER — Ambulatory Visit (HOSPITAL_COMMUNITY)

## 2023-07-29 ENCOUNTER — Ambulatory Visit (HOSPITAL_COMMUNITY)
Admission: RE | Admit: 2023-07-29 | Discharge: 2023-07-29 | Disposition: A | Discharge status: Home / Self Care | Attending: Emergency Medicine | Admitting: Emergency Medicine

## 2023-07-29 ENCOUNTER — Ambulatory Visit (HOSPITAL_COMMUNITY): Payer: Self-pay | Admitting: Vascular Surgery

## 2023-07-29 ENCOUNTER — Encounter (HOSPITAL_COMMUNITY)
Admission: RE | Disposition: A | Discharge status: Home / Self Care | Payer: Self-pay | Source: Home / Self Care | Attending: Emergency Medicine

## 2023-07-29 ENCOUNTER — Encounter (HOSPITAL_COMMUNITY): Payer: Self-pay | Admitting: Emergency Medicine

## 2023-07-29 ENCOUNTER — Other Ambulatory Visit: Payer: Self-pay

## 2023-07-29 DIAGNOSIS — G4733 Obstructive sleep apnea (adult) (pediatric): Secondary | ICD-10-CM | POA: Diagnosis not present

## 2023-07-29 DIAGNOSIS — E7800 Pure hypercholesterolemia, unspecified: Secondary | ICD-10-CM | POA: Insufficient documentation

## 2023-07-29 DIAGNOSIS — R911 Solitary pulmonary nodule: Secondary | ICD-10-CM | POA: Diagnosis not present

## 2023-07-29 DIAGNOSIS — I2510 Atherosclerotic heart disease of native coronary artery without angina pectoris: Secondary | ICD-10-CM | POA: Diagnosis not present

## 2023-07-29 DIAGNOSIS — K219 Gastro-esophageal reflux disease without esophagitis: Secondary | ICD-10-CM | POA: Diagnosis not present

## 2023-07-29 DIAGNOSIS — I252 Old myocardial infarction: Secondary | ICD-10-CM | POA: Diagnosis not present

## 2023-07-29 DIAGNOSIS — I255 Ischemic cardiomyopathy: Secondary | ICD-10-CM | POA: Diagnosis not present

## 2023-07-29 DIAGNOSIS — I10 Essential (primary) hypertension: Secondary | ICD-10-CM

## 2023-07-29 DIAGNOSIS — Z801 Family history of malignant neoplasm of trachea, bronchus and lung: Secondary | ICD-10-CM | POA: Insufficient documentation

## 2023-07-29 DIAGNOSIS — M1990 Unspecified osteoarthritis, unspecified site: Secondary | ICD-10-CM | POA: Insufficient documentation

## 2023-07-29 DIAGNOSIS — J90 Pleural effusion, not elsewhere classified: Secondary | ICD-10-CM | POA: Diagnosis not present

## 2023-07-29 DIAGNOSIS — I25110 Atherosclerotic heart disease of native coronary artery with unstable angina pectoris: Secondary | ICD-10-CM | POA: Diagnosis not present

## 2023-07-29 DIAGNOSIS — Z7902 Long term (current) use of antithrombotics/antiplatelets: Secondary | ICD-10-CM | POA: Insufficient documentation

## 2023-07-29 DIAGNOSIS — R7303 Prediabetes: Secondary | ICD-10-CM | POA: Insufficient documentation

## 2023-07-29 DIAGNOSIS — Z955 Presence of coronary angioplasty implant and graft: Secondary | ICD-10-CM | POA: Diagnosis not present

## 2023-07-29 DIAGNOSIS — Z87891 Personal history of nicotine dependence: Secondary | ICD-10-CM | POA: Diagnosis not present

## 2023-07-29 DIAGNOSIS — Z7982 Long term (current) use of aspirin: Secondary | ICD-10-CM | POA: Diagnosis not present

## 2023-07-29 HISTORY — PX: BRONCHIAL NEEDLE ASPIRATION BIOPSY: SHX5106

## 2023-07-29 HISTORY — PX: VIDEO BRONCHOSCOPY WITH ENDOBRONCHIAL NAVIGATION: SHX6175

## 2023-07-29 HISTORY — PX: CRYOTHERAPY: SHX6894

## 2023-07-29 HISTORY — PX: BRONCHIAL BRUSHINGS: SHX5108

## 2023-07-29 LAB — BASIC METABOLIC PANEL WITH GFR
Anion gap: 7 (ref 5–15)
BUN: 11 mg/dL (ref 8–23)
CO2: 21 mmol/L — ABNORMAL LOW (ref 22–32)
Calcium: 9.2 mg/dL (ref 8.9–10.3)
Chloride: 110 mmol/L (ref 98–111)
Creatinine, Ser: 0.86 mg/dL (ref 0.61–1.24)
GFR, Estimated: >60 mL/min (ref >60)
Glucose, Bld: 122 mg/dL — ABNORMAL HIGH (ref 70–99)
Potassium: 4.7 mmol/L (ref 3.5–5.1)
Sodium: 138 mmol/L (ref 135–145)

## 2023-07-29 LAB — CBC
HCT: 42.4 % (ref 39.0–52.0)
Hemoglobin: 14.5 g/dL (ref 13.0–17.0)
MCH: 33.6 pg (ref 26.0–34.0)
MCHC: 34.2 g/dL (ref 30.0–36.0)
MCV: 98.1 fL (ref 80.0–100.0)
Platelets: 136 K/uL — ABNORMAL LOW (ref 150–400)
RBC: 4.32 MIL/uL (ref 4.22–5.81)
RDW: 12.2 % (ref 11.5–15.5)
WBC: 7 K/uL (ref 4.0–10.5)
nRBC: 0 % (ref 0.0–0.2)

## 2023-07-29 SURGERY — VIDEO BRONCHOSCOPY WITH ENDOBRONCHIAL NAVIGATION
Anesthesia: General | Laterality: Left

## 2023-07-29 MED ORDER — FENTANYL CITRATE (PF) 100 MCG/2ML IJ SOLN: 25.0000 ug | INTRAMUSCULAR | Status: DC | PRN

## 2023-07-29 MED ORDER — SUGAMMADEX SODIUM 200 MG/2ML IV SOLN
INTRAVENOUS | Status: DC | PRN
  Administered 2023-07-29 (×2): 200 mg via INTRAVENOUS

## 2023-07-29 MED ORDER — ASPIRIN EC 81 MG PO TBEC: 81.0000 mg | DELAYED_RELEASE_TABLET | Freq: Every day | ORAL | Status: AC

## 2023-07-29 MED ORDER — ACETAMINOPHEN 10 MG/ML IV SOLN: 1000.0000 mg | Freq: Once | INTRAVENOUS | Status: DC | PRN

## 2023-07-29 MED ORDER — DEXMEDETOMIDINE HCL IN NACL 80 MCG/20ML IV SOLN
INTRAVENOUS | Status: DC | PRN
Start: 2023-07-29 — End: 2023-07-29
  Administered 2023-07-29: 8 ug via INTRAVENOUS

## 2023-07-29 MED ORDER — CHLORHEXIDINE GLUCONATE 0.12 % MT SOLN
15.0000 mL | Freq: Once | OROMUCOSAL | Status: AC
  Administered 2023-07-29: 15 mL via OROMUCOSAL
  Filled 2023-07-29: qty 15

## 2023-07-29 MED ORDER — PROPOFOL 500 MG/50ML IV EMUL
INTRAVENOUS | Status: DC | PRN
  Administered 2023-07-29: 150 ug/kg/min via INTRAVENOUS

## 2023-07-29 MED ORDER — PHENYLEPHRINE 80 MCG/ML (10ML) SYRINGE FOR IV PUSH (FOR BLOOD PRESSURE SUPPORT)
PREFILLED_SYRINGE | INTRAVENOUS | Status: DC | PRN
  Administered 2023-07-29: 160 ug via INTRAVENOUS
  Administered 2023-07-29: 240 ug via INTRAVENOUS

## 2023-07-29 MED ORDER — ONDANSETRON HCL 4 MG/2ML IJ SOLN
INTRAMUSCULAR | Status: DC | PRN
  Administered 2023-07-29: 4 mg via INTRAVENOUS

## 2023-07-29 MED ORDER — LIDOCAINE 2% (20 MG/ML) 5 ML SYRINGE
INTRAMUSCULAR | Status: DC | PRN
  Administered 2023-07-29: 100 mg via INTRAVENOUS

## 2023-07-29 MED ORDER — PHENYLEPHRINE HCL-NACL 20-0.9 MG/250ML-% IV SOLN
INTRAVENOUS | Status: DC | PRN
  Administered 2023-07-29: 50 ug/min via INTRAVENOUS

## 2023-07-29 MED ORDER — DEXAMETHASONE SODIUM PHOSPHATE 10 MG/ML IJ SOLN
INTRAMUSCULAR | Status: DC | PRN
  Administered 2023-07-29: 10 mg via INTRAVENOUS

## 2023-07-29 MED ORDER — GLYCOPYRROLATE PF 0.2 MG/ML IJ SOSY
PREFILLED_SYRINGE | INTRAMUSCULAR | Status: DC | PRN
  Administered 2023-07-29: .1 mg via INTRAVENOUS

## 2023-07-29 MED ORDER — LACTATED RINGERS IV SOLN: INTRAVENOUS | Status: DC

## 2023-07-29 MED ORDER — PROPOFOL 10 MG/ML IV BOLUS
INTRAVENOUS | Status: DC | PRN
  Administered 2023-07-29: 50 mg via INTRAVENOUS
  Administered 2023-07-29: 170 mg via INTRAVENOUS
  Administered 2023-07-29: 50 mg via INTRAVENOUS

## 2023-07-29 MED ORDER — CLOPIDOGREL BISULFATE 75 MG PO TABS: 75.0000 mg | ORAL_TABLET | Freq: Every day | ORAL | Status: AC

## 2023-07-29 MED ORDER — ROCURONIUM BROMIDE 10 MG/ML (PF) SYRINGE
PREFILLED_SYRINGE | INTRAVENOUS | Status: DC | PRN
  Administered 2023-07-29: 60 mg via INTRAVENOUS
  Administered 2023-07-29: 40 mg via INTRAVENOUS

## 2023-07-29 SURGICAL SUPPLY — 37 items
ADAPTER BRONCHOSCOPE OLYMPUS (ADAPTER) ×2 IMPLANT
ADAPTER VALVE BIOPSY EBUS (MISCELLANEOUS) IMPLANT
BAG COUNTER SPONGE SURGICOUNT (BAG) ×2 IMPLANT
BRUSH CYTOL CELLEBRITY 1.5X140 (MISCELLANEOUS) ×2 IMPLANT
BRUSH SUPERTRAX BIOPSY (INSTRUMENTS) IMPLANT
BRUSH SUPERTRAX NDL-TIP CYTO (INSTRUMENTS) ×2 IMPLANT
CANISTER SUCTION 3000ML PPV (SUCTIONS) ×2 IMPLANT
CNTNR URN SCR LID CUP LEK RST (MISCELLANEOUS) ×2 IMPLANT
COVER BACK TABLE 60X90IN (DRAPES) ×2 IMPLANT
FILTER STRAW FLUID ASPIR (MISCELLANEOUS) IMPLANT
FORCEPS BIOP 1.5 SINGLE USE (MISCELLANEOUS) ×2 IMPLANT
FORCEPS BIOP SUPERTRX PREMAR (INSTRUMENTS) ×2 IMPLANT
GAUZE SPONGE 4X4 12PLY STRL (GAUZE/BANDAGES/DRESSINGS) ×2 IMPLANT
GLOVE BIO SURGEON STRL SZ7.5 (GLOVE) ×4 IMPLANT
GOWN STRL REUS W/ TWL LRG LVL3 (GOWN DISPOSABLE) ×4 IMPLANT
KIT CLEAN ENDO COMPLIANCE (KITS) ×2 IMPLANT
KIT LOCATABLE GUIDE (CANNULA) IMPLANT
KIT MARKER FIDUCIAL DELIVERY (KITS) IMPLANT
KIT TURNOVER KIT B (KITS) ×2 IMPLANT
MARKER SKIN DUAL TIP RULER LAB (MISCELLANEOUS) ×2 IMPLANT
NDL SUPERTRX PREMARK BIOPSY (NEEDLE) ×2 IMPLANT
NEEDLE SUPERTRX PREMARK BIOPSY (NEEDLE) ×2 IMPLANT
NS IRRIG 1000ML POUR BTL (IV SOLUTION) ×2 IMPLANT
OIL SILICONE PENTAX (PARTS (SERVICE/REPAIRS)) ×2 IMPLANT
PAD ARMBOARD POSITIONER FOAM (MISCELLANEOUS) ×4 IMPLANT
PATCHES PATIENT (LABEL) ×6 IMPLANT
SYR 20ML ECCENTRIC (SYRINGE) ×2 IMPLANT
SYR 20ML LL LF (SYRINGE) ×2 IMPLANT
SYR 50ML SLIP (SYRINGE) ×2 IMPLANT
TOWEL GREEN STERILE FF (TOWEL DISPOSABLE) ×2 IMPLANT
TRAP SPECIMEN MUCUS 40CC (MISCELLANEOUS) IMPLANT
TUBE CONNECTING 20X1/4 (TUBING) ×2 IMPLANT
UNDERPAD 30X36 HEAVY ABSORB (UNDERPADS AND DIAPERS) ×2 IMPLANT
VALVE BIOPSY SINGLE USE (MISCELLANEOUS) ×2 IMPLANT
VALVE SUCTION BRONCHIO DISP (MISCELLANEOUS) ×2 IMPLANT
WATER STERILE IRR 1000ML POUR (IV SOLUTION) ×2 IMPLANT
superlock fiducial marker IMPLANT

## 2023-07-29 NOTE — Op Note (Signed)
 Video Bronchoscopy with Robotic Assisted Bronchoscopic Navigation   Date of Operation: 07/29/2023   Pre-op Diagnosis: Left lower lobe nodule  Post-op Diagnosis: Same  Surgeon: Lamar Chris  Assistants: Thom Chill  Anesthesia: General endotracheal anesthesia  Operation: Flexible video fiberoptic bronchoscopy with robotic assistance and biopsies.  Estimated Blood Loss: Minimal  Complications: None  Indications and History: Vincent Flores is a 73 y.o. male with history of tobacco use.  He has an enlarging pulmonary nodule in the superior segment of the left lower lobe.  Recommendation made to achieve a tissue diagnosis via robotic assisted navigational bronchoscopy.  The risks, benefits, complications, treatment options and expected outcomes were discussed with the patient.  The possibilities of pneumothorax, pneumonia, reaction to medication, pulmonary aspiration, perforation of a viscus, bleeding, failure to diagnose a condition and creating a complication requiring transfusion or operation were discussed with the patient who freely signed the consent.    Description of Procedure: The patient was seen in the Preoperative Area, was examined and was deemed appropriate to proceed.  The patient was taken to [facility] room 3, identified as Vincent Flores and the procedure verified as Flexible Video Fiberoptic Bronchoscopy.  A Time Out was held and the above information confirmed.   Prior to the date of the procedure a high-resolution CT scan of the chest was performed. Utilizing ION software program a virtual tracheobronchial tree was generated to allow the creation of distinct navigation pathways to the patient's parenchymal abnormalities. After being taken to the operating room general anesthesia was initiated and the patient  was orally intubated. The video fiberoptic bronchoscope was introduced via the endotracheal tube and a general inspection was performed which showed normal right and  left lung anatomy. Aspiration of the bilateral mainstems was completed to remove any remaining secretions. Robotic catheter inserted into patient's endotracheal tube.   Target #1 lower lobe nodule: The distinct navigation pathways prepared prior to this procedure were then utilized to navigate to patient's lesion identified on CT scan. The robotic catheter was secured into place and the vision probe was withdrawn.  Lesion location was approximated using fluoroscopy.  Local registration and targeting was performed using [employer] Cios mobile C-arm three-dimensional imaging. Under fluoroscopic guidance transbronchial needle brushings, transbronchial needle biopsies, transbronchial cryoprobe biopsies and transbronchial forceps biopsies were performed to be sent for cytology and pathology.  Needle-in-lesion was confirmed using Cios mobile C-arm. Under fluoroscopic guidance a single fiducial marker was placed adjacent to the nodule.    At the end of the procedure a general airway inspection was performed and there was no evidence of active bleeding. The bronchoscope was removed.  The patient tolerated the procedure well. There was no significant blood loss and there were no obvious complications. A post-procedural chest x-ray is pending.  Samples Target #1: 1. Transbronchial needle brushings from left lower lobe nodule 2. Transbronchial Wang needle biopsies from left lower lobe nodule 3. Transbronchial forceps biopsies from left lower lobe nodule 4.  Transbronchial cryoprobe biopsies from the left lower lobe nodule    Plans:  The patient will be discharged from the PACU to home when recovered from anesthesia and after chest x-ray is reviewed. We will review the cytology, pathology and microbiology results with the patient when they become available. Outpatient followup will be with CANDIE Lites, NP and Dr Chris.   Lamar Chris, MD, PhD 07/29/2023, 1:21 PM [facility] [phone] or if no answer before 7:00PM call [phone] For any issues after 7:00PM  please call eLink [phone]

## 2023-07-29 NOTE — Transfer of Care (Signed)
 Immediate Anesthesia Transfer of Care Note  Patient: Vincent Flores  Procedure(s) Performed: VIDEO BRONCHOSCOPY WITH ENDOBRONCHIAL NAVIGATION (Left) BRONCHOSCOPY, WITH NEEDLE ASPIRATION BIOPSY CRYOTHERAPY BRONCHOSCOPY, WITH BRUSH BIOPSY  Patient Location: PACU  Anesthesia Type:General  Level of Consciousness: awake, alert , and oriented  Airway & Oxygen Therapy: Patient connected to nasal cannula oxygen  Post-op Assessment: Report given to RN and Post -op Vital signs reviewed and stable  Post vital signs: Reviewed and stable  Last Vitals:  Vitals Value Taken Time  BP 91/52 07/29/23 13:30  Temp    Pulse 67 07/29/23 13:31  Resp 19 07/29/23 13:31  SpO2 95 % 07/29/23 13:31  Vitals shown include unfiled device data.  Last Pain:  Vitals:   07/29/23 1034  TempSrc:   PainSc: 0-No pain         Complications: No notable events documented.

## 2023-07-29 NOTE — Anesthesia Procedure Notes (Signed)
 Procedure Name: Intubation Date/Time: 07/29/2023 12:28 PM  Performed by: Mollie Olivia SAUNDERS, CRNAPre-anesthesia Checklist: Patient identified, Emergency Drugs available, Suction available and Patient being monitored Patient Re-evaluated:Patient Re-evaluated prior to induction Oxygen Delivery Method: Circle System Utilized Preoxygenation: Pre-oxygenation with 100% oxygen Induction Type: IV induction Ventilation: Mask ventilation without difficulty Laryngoscope Size: Glidescope and 3 Grade View: Grade I Tube type: Oral Tube size: 8.5 mm Number of attempts: 1 Airway Equipment and Method: Stylet and Oral airway Placement Confirmation: ETT inserted through vocal cords under direct vision, positive ETCO2 and breath sounds checked- equal and bilateral Secured at: 23 cm Tube secured with: Tape Dental Injury: Teeth and Oropharynx as per pre-operative assessment

## 2023-07-29 NOTE — Discharge Instructions (Addendum)
 Flexible Bronchoscopy, Care After This sheet gives you information about how to care for yourself after your test. Your doctor may also give you more specific instructions. If you have problems or questions, contact your doctor. Follow these instructions at home: Eating and drinking When you are wide awake, your numbness is gone and your cough and gag reflexes have come back, you may: Start eating only soft foods. Slowly drink liquids. Six hours after the test, go back to your normal diet. Driving Do not drive for 24 hours if you were given a medicine to help you relax (sedative). Do not drive or use heavy machinery while taking prescription pain medicine. General instructions Take over-the-counter and prescription medicines only as told by your doctor. Return to your normal activities as told. Ask what activities are safe for you. Do not use any products that have nicotine or tobacco in them. This includes cigarettes and e-cigarettes. If you need help quitting, ask your doctor. Keep all follow-up visits as told by your doctor. This is important. It is very important if you had a tissue sample (biopsy) taken. Get help right away if: You have shortness of breath that gets worse. You get light-headed. You feel like you are going to pass out (faint). You have chest pain. You cough up: More than a little blood. More blood than before. Summary Do not use cigarettes. Do not use e-cigarettes. Seek care in the Emergency Department right away if you have chest pain or shortness of breath. Call or MyChart Message our office for any questions or problems at [phone].  Okay to restart Plavix  and aspirin  on 07/30/2023.   This information is not intended to replace advice given to you by your health care provider. Make sure you discuss any questions you have with your health care provider.

## 2023-07-29 NOTE — Interval H&P Note (Signed)
 History and Physical Interval Note:  07/29/2023 12:11 PM  Vincent Flores  has presented today for surgery, with the diagnosis of lung nodule.  The various methods of treatment have been discussed with the patient and family. After consideration of risks, benefits and other options for treatment, the patient has consented to  Procedure(s): VIDEO BRONCHOSCOPY WITH ENDOBRONCHIAL NAVIGATION (Left) as a surgical intervention.  The patient's history has been reviewed, patient examined, no change in status, stable for surgery.  I have reviewed the patient's chart and labs.  Questions were answered to the patient's satisfaction.     Lamar GORMAN Chris

## 2023-07-29 NOTE — Progress Notes (Signed)
 CXR reviewed by Dr Shelah. Per Dr Shelah, pt okay to be discharged.  Ozell VEAR Pouch, RN 07/29/23 1:52 PM

## 2023-07-29 NOTE — Op Note (Signed)
 Procedure Note  Patient: Vincent Flores  [employer] Cios mobile C-arm was utilized to identify and biopsy pulmonary nodule in the superior segment of the left lower lobe.  Needle-in-lesion was confirmed using real-time Cios imaging, and images were uploaded to PACS.   Lamar Chris, MD, PhD 07/29/2023, 1:24 PM [facility] [phone] or if no answer before 7:00PM call [phone] For any issues after 7:00PM please call eLink [phone]

## 2023-07-30 ENCOUNTER — Encounter (HOSPITAL_COMMUNITY): Payer: Self-pay | Admitting: Emergency Medicine

## 2023-07-30 NOTE — Anesthesia Postprocedure Evaluation (Signed)
 Anesthesia Post Note  Patient: Vincent Flores  Procedure(s) Performed: VIDEO BRONCHOSCOPY WITH ENDOBRONCHIAL NAVIGATION (Left) BRONCHOSCOPY, WITH NEEDLE ASPIRATION BIOPSY CRYOTHERAPY BRONCHOSCOPY, WITH BRUSH BIOPSY     Patient location during evaluation: PACU Anesthesia Type: General Level of consciousness: awake and alert Pain management: pain level controlled Vital Signs Assessment: post-procedure vital signs reviewed and stable Respiratory status: spontaneous breathing, nonlabored ventilation, respiratory function stable and patient connected to nasal cannula oxygen Cardiovascular status: blood pressure returned to baseline and stable Postop Assessment: no apparent nausea or vomiting Anesthetic complications: no   No notable events documented.  Last Vitals:  Vitals:   07/29/23 1340 07/29/23 1350  BP: (!) 92/53 (!) 98/59  Pulse: 65 62  Resp: 16 16  Temp:    SpO2: 96% 93%    Last Pain:  Vitals:   07/29/23 1350  TempSrc:   PainSc: 0-No pain                 Cordella P Gwenetta Devos

## 2023-07-31 LAB — SURGICAL PATHOLOGY

## 2023-07-31 LAB — CYTOLOGY - NON PAP

## 2023-08-01 DIAGNOSIS — G4733 Obstructive sleep apnea (adult) (pediatric): Secondary | ICD-10-CM | POA: Diagnosis not present

## 2023-08-07 ENCOUNTER — Encounter: Payer: Self-pay | Admitting: Acute Care

## 2023-08-07 ENCOUNTER — Ambulatory Visit (INDEPENDENT_AMBULATORY_CARE_PROVIDER_SITE_OTHER)

## 2023-08-07 ENCOUNTER — Ambulatory Visit (INDEPENDENT_AMBULATORY_CARE_PROVIDER_SITE_OTHER): Admitting: Acute Care

## 2023-08-07 VITALS — BP 105/71 | HR 73 | Ht 66.0 in | Wt 219.6 lb

## 2023-08-07 DIAGNOSIS — Z9889 Other specified postprocedural states: Secondary | ICD-10-CM | POA: Diagnosis not present

## 2023-08-07 DIAGNOSIS — R911 Solitary pulmonary nodule: Secondary | ICD-10-CM

## 2023-08-07 DIAGNOSIS — Z87891 Personal history of nicotine dependence: Secondary | ICD-10-CM | POA: Diagnosis not present

## 2023-08-07 NOTE — Progress Notes (Signed)
 History of Present Illness Vincent Flores is a 73 y.o. male former smoker ( Quit 2011 with a 100 pack year smoking history) referred for lung nodule consult by Dr. Verdia for an abnormal lung cancer screening scan.He is followed by Dr. Shelah.  Pt. Has consented to use of [employer] soft wear to help capture the content of this OV   08/07/2023 Vincent Flores is a 73 year old male with lung nodules who presents for follow-up after  navigational bronchoscopy with biopsies.   Pt has a 12 x 10 mm solid left lower lobe nodule, that has increased in size since 2021.   He underwent a navigational biopsy 07/23/2023 . He states he has done well since the procedure. No bleeding, fever, discolored secretions, worsening shortness of breath, or adverse reaction to anesthesia.  We have reviewed his biopsy results.  Cytology was nondiagnostic however surgical pathology resulted in benign alveolar tissue with no specific pathologic change.  Plan will be for a 78-month follow-up CT as continued surveillance.  Patient is in agreement with this.  Post-procedure, he experiences tenderness on his right side, suspecting a possible rib injury due to a previous rib fracture. A chest x-ray post-procedure showed small bilateral pleural effusions but no pneumothorax, or rib fractures.  The small pleural effusions were not noted on any prebronchoscopy chest imaging.  Patient denies any shortness of breath, no lower extremity edema however I will do a chest x-ray just to confirm resolution of the small pleural effusions.  He has a history of heart issues, including a past heart attack, but denies current symptoms of heart failure such as swollen ankles or unexplained shortness of breath. His cardiologist is retiring, and he is awaiting assignment to a new cardiologist.  Pt. States he has gained weight since quitting smoking.With a BMI of 35, he has considered weight loss options to manage his weight . He has made dietary  changes and he has made adjustments to control salt intake. He will talk with his PCP about weight management options.   Test Results: CXR 08/07/2023 The heart size and mediastinal contours are within normal limits. No definite radiographic pulmonary abnormality seen. The visualized skeletal structures are unremarkable.   IMPRESSION: No active cardiopulmonary disease.  CXR 07/29/2023 Post navigational bronchoscopy with biopsies Small bilateral pleural effusions. Unchanged heart. No pneumothorax. Radiopaque marker projects over the left mid lung zone.   IMPRESSION: 1. No pneumothorax. 2. Small bilateral pleural effusions.  Surgical Pathology 07/29/2023 A. LUNG, LEFT LOWER LOBE, BIOPSY:  - Benign alveolar tissue with no specific pathologic change.    Cytology A. LUNG, LLL, FINE NEEDLE ASPIRATION:  - Nondiagnostic material.   B. LUNG, LLL, BRUSHING:  - Nondiagnostic material.   LDCT 07/23/2023 MPRESSION: 1. 12 x 10 mm solid left lower lobe nodule, unchanged from the most recent prior, previously 9 x 8 mm in 2021. If intervention is not performed, consider 53-month follow up CT chest or PET/CT.       Latest Ref Rng & Units 07/29/2023   10:40 AM 10/01/2022    1:59 PM 08/20/2022    3:02 PM  CBC  WBC 4.0 - 10.5 K/uL 7.0  7.3  11.3   Hemoglobin 13.0 - 17.0 g/dL 85.4  85.6  85.4   Hematocrit 39.0 - 52.0 % 42.4  42.5  41.9   Platelets 150 - 400 K/uL 136  143  141        Latest Ref Rng & Units 07/29/2023   10:40 AM 10/01/2022  1:59 PM 08/20/2022    3:02 PM  BMP  Glucose 70 - 99 mg/dL 877  93  845   BUN 8 - 23 mg/dL 11  13  11    Creatinine 0.61 - 1.24 mg/dL 9.13  8.93  9.05   Sodium 135 - 145 mmol/L 138  138  138   Potassium 3.5 - 5.1 mmol/L 4.7  5.0  3.7   Chloride 98 - 111 mmol/L 110  104  101   CO2 22 - 32 mmol/L 21  27  24    Calcium  8.9 - 10.3 mg/dL 9.2  9.3  9.4     BNP    Component Value Date/Time   BNP 68.7 11/04/2014 0744    ProBNP No results found for:  PROBNP  PFT No results found for: FEV1PRE, FEV1POST, FVCPRE, FVCPOST, TLC, DLCOUNC, PREFEV1FVCRT, PSTFEV1FVCRT  DG Chest Port 1 View Result Date: 07/29/2023 CLINICAL DATA:  Bronchoscopy with biopsy EXAM: PORTABLE CHEST 1 VIEW COMPARISON:  Chest x-ray performed August 20, 2022 FINDINGS: Small bilateral pleural effusions. Unchanged heart. No pneumothorax. Radiopaque marker projects over the left mid lung zone. IMPRESSION: 1. No pneumothorax. 2. Small bilateral pleural effusions. Electronically Signed   By: Maude Naegeli M.D.   On: 07/29/2023 13:55   DG C-ARM BRONCHOSCOPY Result Date: 07/29/2023 C-ARM BRONCHOSCOPY: Fluoroscopy was utilized by the requesting physician.  No radiographic interpretation.   CT SUPER D CHEST WO CONTRAST Result Date: 07/26/2023 EXAM: CT CHEST WITHOUT CONTRAST 07/23/2023 02:36:00 PM TECHNIQUE: CT of the chest was performed without the administration of intravenous contrast. Multiplanar reformatted images are provided for review. Automated exposure control, iterative reconstruction, and/or weight based adjustment of the mA/kV was utilized to reduce the radiation dose to as low as reasonably achievable. COMPARISON: 04/22/2023 and 06/26/2019 CLINICAL HISTORY: Pt.states lung nodule FINDINGS: MEDIASTINUM: Heart and pericardium are unremarkable. The central airways are clear. Severe 3-vessel coronary arthrosis. Thoracic aortic atherosclerosis. LYMPH NODES: No mediastinal, hilar or axillary lymphadenopathy. LUNGS AND PLEURA: 12 x 10 mm solid left lower lobe nodule, unchanged from the most recent prior, previously 9 x 8 mm in 2021. Mild centrilobular and paraseptal emphysematous changes, upper lung predominant. Mild subpleural reticulations in the bilateral lower lungs. No focal consolidation or pulmonary edema. No pleural effusion or pneumothorax. SOFT TISSUES/BONES: No acute abnormality of the bones or soft tissues. UPPER ABDOMEN: 3.9 cm simple right hepatic cyst (image  56). IMPRESSION: 1. 12 x 10 mm solid left lower lobe nodule, unchanged from the most recent prior, previously 9 x 8 mm in 2021. If intervention is not performed, consider 43-month follow up CT chest or PET/CT. Electronically signed by: Pinkie Pebbles MD 07/26/2023 11:47 PM EDT RP Workstation: [id number]     Past medical hx Past Medical History:  Diagnosis Date   Arthritis    CAD (coronary artery disease)    a. NSTEMI 10/16: mid LAD 100% >> PCI with Synergy DES   HTN (hypertension)    Hyperlipidemia    Ischemic cardiomyopathy    a. Echo 10/16: Moderate LVH, EF 35-40%, anteroseptal akinesis, grade 1 diastolic;  b. Echo 1/17: mild LVH, EF 60-65%, no RWMA, Gr 1 DD.   Myocardial infarction (HCC)    Pre-diabetes    Sleep apnea    has cpap     Social History   Tobacco Use   Smoking status: Former    Current packs/day: 0.00    Types: Cigarettes    Quit date: 01/08/2009    Years since quitting: 14.5  Passive exposure: Past   Smokeless tobacco: Never   Tobacco comments:    Began smoking in 1967    Has smoked 2-3 ppd x 40 years, quit 2011 with a 100 pack year smoking history  Vaping Use   Vaping status: Never Used  Substance Use Topics   Alcohol use: Not Currently    Alcohol/week: 7.0 standard drinks of alcohol    Types: 7 Cans of beer per week   Drug use: No    Mr.Hovland reports that he quit smoking about 14 years ago. His smoking use included cigarettes. He has been exposed to tobacco smoke. He has never used smokeless tobacco. He reports that he does not currently use alcohol after a past usage of about 7.0 standard drinks of alcohol per week. He reports that he does not use drugs.  Tobacco Cessation: Counseling given: Not Answered Tobacco comments: Began smoking in 1967 Has smoked 2-3 ppd x 40 years, quit 2011 with a 100 pack year smoking history   Past surgical hx, Family hx, Social hx all reviewed.  Current Outpatient Medications on File Prior to Visit  Medication Sig    aspirin  EC 81 MG tablet Take 1 tablet (81 mg total) by mouth daily. Okay to restart on 07/30/2023.   atorvastatin  (LIPITOR ) 80 MG tablet Take 80 mg by mouth daily.   carvedilol  (COREG ) 6.25 MG tablet TAKE 1 TABLET BY MOUTH TWICE A DAY WITH FOOD   clopidogrel  (PLAVIX ) 75 MG tablet Take 1 tablet (75 mg total) by mouth daily. Okay to restart on 07/30/2023.   diphenhydramine-acetaminophen  (TYLENOL  PM) 25-500 MG TABS tablet Take 2 tablets by mouth at bedtime as needed (pain / sleep).   isosorbide  mononitrate (IMDUR ) 60 MG 24 hr tablet TAKE 1 TABLET BY MOUTH EVERY DAY   lisinopril  (ZESTRIL ) 10 MG tablet TAKE 1/2 TABLET BY MOUTH DAILY   nitroGLYCERIN  (NITROSTAT ) 0.4 MG SL tablet Place 1 tablet (0.4 mg total) under the tongue every 5 (five) minutes x 3 doses as needed for chest pain.   pantoprazole  (PROTONIX ) 40 MG tablet TAKE 1 TABLET BY MOUTH EVERY DAY   No current facility-administered medications on file prior to visit.     No Known Allergies  Review Of Systems:  Constitutional:   No  weight loss, night sweats,  Fevers, chills, fatigue, or  lassitude.  HEENT:   No headaches,  Difficulty swallowing,  Tooth/dental problems, or  Sore throat,                No sneezing, itching, ear ache, nasal congestion, post nasal drip,   CV:  No chest pain,  Orthopnea, PND, swelling in lower extremities, anasarca, dizziness, palpitations, syncope.   GI  No heartburn, indigestion, abdominal pain, nausea, vomiting, diarrhea, change in bowel habits, loss of appetite, bloody stools.   Resp: No shortness of breath with exertion or at rest.  No excess mucus, no productive cough,  No non-productive cough,  No coughing up of blood.  No change in color of mucus.  No wheezing.  No chest wall deformity  Skin: no rash or lesions.  GU: no dysuria, change in color of urine, no urgency or frequency.  No flank pain, no hematuria   MS:  No joint pain or swelling.  No decreased range of motion.  No back pain. + right  sided tenderness.  Psych:  No change in mood or affect. No depression or anxiety.  No memory loss.   Vital Signs BP 105/71 (BP Location: Left Arm,  Patient Position: Sitting, Cuff Size: Large)   Pulse 73   Ht 5' 6 (1.676 m)   Wt 219 lb 9.6 oz (99.6 kg)   SpO2 97%   BMI 35.44 kg/m    Physical Exam:  General- No distress,  A&Ox3, pleasant and appropriate ENT: No sinus tenderness, TM clear, pale nasal mucosa, no oral exudate,no post nasal drip, no LAN Cardiac: S1, S2, regular rate and rhythm, no murmur Chest: No wheeze/ rales/ dullness; no accessory muscle use, no nasal flaring, no sternal retractions Abd.: Soft Non-tender, ND, BS +, Body mass index is 35.44 kg/m.  Ext: No clubbing cyanosis, edema, no obvious deformities Neuro:  normal strength, MAE x 4, A&O x 3 Skin: No rashes, warm and dry, no obvious skin lesions  Psych: normal mood and behavior   Assessment/Plan Left lower lobe lung nodule with slow growth over time Post bronchoscopy with biopsies Negative for malignancy Former smoker Plan Your biopsies of the left lower lobe were benign, which is great news.  We will do a 6 month follow up scan as surveillance.  This will be due 01/2024. You will get a call to get this scheduled closer to the time. Follow up with Lauraine NP after scan to review results Call if you need us  sooner. Call to be seen sooner for unexplained weight loss or blood in the sputum. Please contact office for sooner follow up if symptoms do not improve or worsen or seek emergency care    BMI of 35 Weight gain since quitting smoking Cardiac history Plan Will discuss weight loss options with PCP.  I spent 30 minutes dedicated to the care of this patient on the date of this encounter to include pre-visit review of records, face-to-face time with the patient discussing conditions above, post visit ordering of testing, clinical documentation with the electronic health record, making appropriate  referrals as documented, and communicating necessary information to the patient's healthcare team.   Lauraine JULIANNA Lites, NP 08/07/2023  1:35 PM

## 2023-08-07 NOTE — Patient Instructions (Addendum)
 It is good to see you today. Your biopsies of the left lower lobe were benign, which is great news.  We will do a 6 month follow up scan as surveillance.  This will be due 01/2024. You will get a call to get this scheduled closer to the time. Follow up with Lauraine NP after scan to review results. Call if you need us  sooner. Call to be seen sooner for unexplained weight loss or blood in the sputum. Please contact office for sooner follow up if symptoms do not improve or worsen or seek emergency care

## 2023-08-12 NOTE — Progress Notes (Signed)
 I agree with the plans as outlined above.   Lamar Chris, MD, PhD 08/12/2023, 12:24 PM [facility] [phone] or if no answer before 7:00PM call [phone] For any issues after 7:00PM please call eLink [phone]

## 2023-08-19 ENCOUNTER — Other Ambulatory Visit: Payer: Self-pay

## 2023-08-19 ENCOUNTER — Other Ambulatory Visit: Payer: Self-pay | Admitting: *Deleted

## 2023-08-19 DIAGNOSIS — G4733 Obstructive sleep apnea (adult) (pediatric): Secondary | ICD-10-CM | POA: Diagnosis not present

## 2023-08-19 DIAGNOSIS — E7800 Pure hypercholesterolemia, unspecified: Secondary | ICD-10-CM

## 2023-08-19 MED ORDER — PANTOPRAZOLE SODIUM 40 MG PO TBEC: 40.0000 mg | DELAYED_RELEASE_TABLET | Freq: Every day | ORAL | 0 refills | Status: DC

## 2023-08-19 MED ORDER — CARVEDILOL 6.25 MG PO TABS: 6.2500 mg | ORAL_TABLET | Freq: Two times a day (BID) | ORAL | 0 refills | Status: DC

## 2023-08-19 MED ORDER — LISINOPRIL 10 MG PO TABS: 5.0000 mg | ORAL_TABLET | Freq: Every day | ORAL | 0 refills | Status: DC

## 2023-08-23 ENCOUNTER — Other Ambulatory Visit: Payer: Self-pay | Admitting: Adult Health

## 2023-08-26 DIAGNOSIS — E782 Mixed hyperlipidemia: Secondary | ICD-10-CM | POA: Diagnosis not present

## 2023-08-26 DIAGNOSIS — R7303 Prediabetes: Secondary | ICD-10-CM | POA: Diagnosis not present

## 2023-08-26 DIAGNOSIS — Z0000 Encounter for general adult medical examination without abnormal findings: Secondary | ICD-10-CM | POA: Diagnosis not present

## 2023-08-28 ENCOUNTER — Other Ambulatory Visit: Payer: Self-pay | Admitting: Cardiology

## 2023-09-01 DIAGNOSIS — G4733 Obstructive sleep apnea (adult) (pediatric): Secondary | ICD-10-CM | POA: Diagnosis not present

## 2023-09-02 DIAGNOSIS — I2510 Atherosclerotic heart disease of native coronary artery without angina pectoris: Secondary | ICD-10-CM | POA: Diagnosis not present

## 2023-09-02 DIAGNOSIS — J432 Centrilobular emphysema: Secondary | ICD-10-CM | POA: Diagnosis not present

## 2023-09-02 DIAGNOSIS — N182 Chronic kidney disease, stage 2 (mild): Secondary | ICD-10-CM | POA: Diagnosis not present

## 2023-09-02 DIAGNOSIS — I700 Atherosclerosis of aorta: Secondary | ICD-10-CM | POA: Diagnosis not present

## 2023-09-02 DIAGNOSIS — Z0000 Encounter for general adult medical examination without abnormal findings: Secondary | ICD-10-CM | POA: Diagnosis not present

## 2023-09-02 DIAGNOSIS — E782 Mixed hyperlipidemia: Secondary | ICD-10-CM | POA: Diagnosis not present

## 2023-09-02 DIAGNOSIS — R7303 Prediabetes: Secondary | ICD-10-CM | POA: Diagnosis not present

## 2023-09-02 DIAGNOSIS — L812 Freckles: Secondary | ICD-10-CM | POA: Diagnosis not present

## 2023-09-02 DIAGNOSIS — R911 Solitary pulmonary nodule: Secondary | ICD-10-CM | POA: Diagnosis not present

## 2023-09-07 ENCOUNTER — Other Ambulatory Visit: Payer: Self-pay | Admitting: Adult Health

## 2023-09-16 ENCOUNTER — Other Ambulatory Visit: Payer: Self-pay

## 2023-09-16 DIAGNOSIS — E7800 Pure hypercholesterolemia, unspecified: Secondary | ICD-10-CM

## 2023-09-16 MED ORDER — LISINOPRIL 10 MG PO TABS: 5.0000 mg | ORAL_TABLET | Freq: Every day | ORAL | 0 refills | Status: DC

## 2023-09-27 ENCOUNTER — Encounter (HOSPITAL_COMMUNITY): Payer: Self-pay | Admitting: Emergency Medicine

## 2023-09-27 ENCOUNTER — Ambulatory Visit (HOSPITAL_COMMUNITY)
Admission: EM | Admit: 2023-09-27 | Discharge: 2023-09-27 | Disposition: A | Discharge status: Home / Self Care | Attending: Emergency Medicine | Admitting: Emergency Medicine

## 2023-09-27 DIAGNOSIS — Z23 Encounter for immunization: Secondary | ICD-10-CM

## 2023-09-27 DIAGNOSIS — W5501XA Bitten by cat, initial encounter: Secondary | ICD-10-CM

## 2023-09-27 DIAGNOSIS — S41159A Open bite of unspecified upper arm, initial encounter: Secondary | ICD-10-CM

## 2023-09-27 DIAGNOSIS — S61459A Open bite of unspecified hand, initial encounter: Secondary | ICD-10-CM | POA: Diagnosis not present

## 2023-09-27 MED ORDER — TETANUS-DIPHTH-ACELL PERTUSSIS 5-2.5-18.5 LF-MCG/0.5 IM SUSY
PREFILLED_SYRINGE | INTRAMUSCULAR | Status: AC
  Filled 2023-09-27: qty 0.5

## 2023-09-27 MED ORDER — TETANUS-DIPHTH-ACELL PERTUSSIS 5-2.5-18.5 LF-MCG/0.5 IM SUSY
0.5000 mL | PREFILLED_SYRINGE | Freq: Once | INTRAMUSCULAR | Status: AC
  Administered 2023-09-27: 0.5 mL via INTRAMUSCULAR

## 2023-09-27 MED ORDER — AMOXICILLIN-POT CLAVULANATE 875-125 MG PO TABS: 1.0000 | ORAL_TABLET | Freq: Two times a day (BID) | ORAL | 0 refills | Status: AC

## 2023-09-27 NOTE — ED Triage Notes (Signed)
 Pt reports that his cat bit him on his right hand last night. Today when woke up had swelling, pain, redness and heat.

## 2023-09-27 NOTE — ED Provider Notes (Signed)
 [facility]    CSN: [id number] Arrival date & time: 09/27/23  9066      History   Chief Complaint Chief Complaint  Patient presents with   Animal Bite    HPI Adger Cantera is a 73 y.o. male.   Patient presents to clinic over concern of a cat bite to the dorsal aspect of his right hand.  Reports this cat will typically be sweet and cuddly, when he is done he will bite his owners.  Patient has been bitten multiple times by his cat before not had any issues.  This morning he woke up and noticed that his hand was swollen, inflamed and had pain making a fist.  Cat is indoor and up-to-date on vaccines.  Patient unsure when his last Tdap was, thinks it was in 2010.  Has not tried medications or interventions for the pain or swelling.  Reports cat's teeth are intact, wound was shallow, he is not concern for retained foreign body.  The history is provided by the patient and medical records.  Animal Bite   Past Medical History:  Diagnosis Date   Arthritis    CAD (coronary artery disease)    a. NSTEMI 10/16: mid LAD 100% >> PCI with Synergy DES   HTN (hypertension)    Hyperlipidemia    Ischemic cardiomyopathy    a. Echo 10/16: Moderate LVH, EF 35-40%, anteroseptal akinesis, grade 1 diastolic;  b. Echo 1/17: mild LVH, EF 60-65%, no RWMA, Gr 1 DD.   Myocardial infarction [facility])    Pre-diabetes    Sleep apnea    has cpap    Patient Active Problem List   Diagnosis Date Noted   Lung nodule seen on imaging study 07/22/2023   Unstable angina (HCC) 10/15/2015   CAD in native artery, with NSTEMI 10/2015, stent to LAD, residual disease in other vessels 10/15/2015   Hx of myocardial infarction, 10/2014 10/15/2015   Chest pain at rest    Hemorrhoid 05/18/2015   GERD (gastroesophageal reflux disease) 01/15/2015   Essential hypertension 11/01/2014   Hyperlipidemia 11/01/2014   NSTEMI (non-ST elevated myocardial infarction) (HCC) 10/29/2014   Abnormal EKG 02/14/2012    Past  Surgical History:  Procedure Laterality Date   APPENDECTOMY  1967   APPENDECTOMY     BRONCHIAL BRUSHINGS  07/29/2023   Procedure: BRONCHOSCOPY, WITH BRUSH BIOPSY;  Surgeon: Shelah Lamar RAMAN, MD;  Location: [facility];  Service: Pulmonary;;   BRONCHIAL NEEDLE ASPIRATION BIOPSY  07/29/2023   Procedure: BRONCHOSCOPY, WITH NEEDLE ASPIRATION BIOPSY;  Surgeon: Shelah Lamar RAMAN, MD;  Location: [facility];  Service: Pulmonary;;   CARDIAC CATHETERIZATION N/A 10/29/2014   Procedure: Left Heart Cath and Coronary Angiography;  Surgeon: Victory LELON Sharps, MD;  Location: [facility] INVASIVE CV LAB;  Service: Cardiovascular;  Laterality: N/A;   CARDIAC CATHETERIZATION N/A 10/29/2014   Procedure: Coronary Stent Intervention;  Surgeon: Victory LELON Sharps, MD;  Location: [facility] INVASIVE CV LAB;  Service: Cardiovascular;  Laterality: N/A;   CORONARY STENT PLACEMENT     CRYOTHERAPY  07/29/2023   Procedure: CRYOTHERAPY;  Surgeon: Shelah Lamar RAMAN, MD;  Location: [facility];  Service: Pulmonary;;   INGUINAL HERNIA REPAIR     MOUTH SURGERY     TRANSTHORACIC ECHOCARDIOGRAM  11/08/2011   normal global wall motion.  normal systolic global function.  calculated EF 61 %   UMBILICAL HERNIA REPAIR N/A 10/15/2022   Procedure: HERNIA REPAIR UMBILICAL ADULT WITH MESH;  Surgeon: Lyndel Deward PARAS, MD;  Location: [facility]S;  Service: General;  Laterality: N/A;   VIDEO BRONCHOSCOPY WITH ENDOBRONCHIAL NAVIGATION Left 07/29/2023   Procedure: VIDEO BRONCHOSCOPY WITH ENDOBRONCHIAL NAVIGATION;  Surgeon: Shelah Lamar RAMAN, MD;  Location: [facility] ENDOSCOPY;  Service: Pulmonary;  Laterality: Left;       Home Medications    Prior to Admission medications   Medication Sig Start Date End Date Taking? Authorizing Provider  amoxicillin -clavulanate (AUGMENTIN ) 875-125 MG tablet Take 1 tablet by mouth every 12 (twelve) hours. 09/27/23  Yes Geral Tuch  N, FNP  aspirin  EC 81 MG tablet Take 1 tablet (81 mg total) by mouth daily. Okay to restart on 07/30/2023.  07/29/23   Byrum, Robert S, MD  atorvastatin  (LIPITOR ) 80 MG tablet Take 80 mg by mouth daily. 11/22/21   [provider]  carvedilol  (COREG ) 6.25 MG tablet TAKE 1 TABLET BY MOUTH 2 TIMES DAILY WITH A MEAL. 08/30/23   Jerilynn Lamarr HERO, NP  clopidogrel  (PLAVIX ) 75 MG tablet Take 1 tablet (75 mg total) by mouth daily. Okay to restart on 07/30/2023. 07/29/23   Byrum, Robert S, MD  diphenhydramine-acetaminophen  (TYLENOL  PM) 25-500 MG TABS tablet Take 2 tablets by mouth at bedtime as needed (pain / sleep).    [provider]  isosorbide  mononitrate (IMDUR ) 60 MG 24 hr tablet TAKE 1 TABLET BY MOUTH EVERY DAY 04/23/23   Burnard Debby LABOR, MD  lisinopril  (ZESTRIL ) 10 MG tablet Take 0.5 tablets (5 mg total) by mouth daily. 09/16/23   Jerilynn Lamarr HERO, NP  nitroGLYCERIN  (NITROSTAT ) 0.4 MG SL tablet Place 1 tablet (0.4 mg total) under the tongue every 5 (five) minutes x 3 doses as needed for chest pain. 07/22/17   Jerilynn Lamarr HERO, NP  pantoprazole  (PROTONIX ) 40 MG tablet TAKE 1 TABLET BY MOUTH EVERY DAY 09/10/23   Jerilynn Lamarr HERO, NP    Family History Family History  Problem Relation Age of Onset   Heart disease Mother    Cancer Father        Lung, was a smoker    Social History Social History   Tobacco Use   Smoking status: Former    Current packs/day: 0.00    Types: Cigarettes    Quit date: 01/08/2009    Years since quitting: 14.7    Passive exposure: Past   Smokeless tobacco: Never   Tobacco comments:    Began smoking in 1967    Has smoked 2-3 ppd x 40 years, quit 2011 with a 100 pack year smoking history  Vaping Use   Vaping status: Never Used  Substance Use Topics   Alcohol use: Not Currently    Alcohol/week: 7.0 standard drinks of alcohol    Types: 7 Cans of beer per week   Drug use: No     Allergies   Patient has no known allergies.   Review of Systems Review of Systems  Per HPI  Physical Exam Triage Vital Signs ED Triage Vitals  Encounter Vitals  Group     BP 09/27/23 1016 117/77     Girls Systolic BP Percentile --      Girls Diastolic BP Percentile --      Boys Systolic BP Percentile --      Boys Diastolic BP Percentile --      Pulse Rate 09/27/23 1016 80     Resp 09/27/23 1016 18     Temp 09/27/23 1016 98.2 F (36.8 C)     Temp Source 09/27/23 1016 Oral     SpO2 09/27/23 1016 95 %  Weight --      Height --      Head Circumference --      Peak Flow --      Pain Score 09/27/23 1015 8     Pain Loc --      Pain Education --      Exclude from Growth Chart --    No data found.  Updated Vital Signs BP 117/77 (BP Location: Left Arm)   Pulse 80   Temp 98.2 F (36.8 C) (Oral)   Resp 18   SpO2 95%   Visual Acuity Right Eye Distance:   Left Eye Distance:   Bilateral Distance:    Right Eye Near:   Left Eye Near:    Bilateral Near:     Physical Exam Vitals and nursing note reviewed.  Constitutional:      Appearance: Normal appearance.  HENT:     Head: Normocephalic and atraumatic.     Right Ear: External ear normal.     Left Ear: External ear normal.     Nose: Nose normal.     Mouth/Throat:     Mouth: Mucous membranes are moist.  Eyes:     Conjunctiva/sclera: Conjunctivae normal.  Cardiovascular:     Rate and Rhythm: Normal rate.  Pulmonary:     Effort: Pulmonary effort is normal. No respiratory distress.  Musculoskeletal:        General: Normal range of motion.  Skin:    General: Skin is warm and dry.      Neurological:     General: No focal deficit present.     Mental Status: He is alert and oriented to person, place, and time.  Psychiatric:        Mood and Affect: Mood normal.        Behavior: Behavior normal. Behavior is cooperative.      UC Treatments / Results  Labs (all labs ordered are listed, but only abnormal results are displayed) Labs Reviewed - No data to display  EKG   Radiology No results found.  Procedures Procedures (including critical care time)  Medications  Ordered in UC Medications  Tdap (BOOSTRIX ) injection 0.5 mL (has no administration in time range)    Initial Impression / Assessment and Plan / UC Course  I have reviewed the triage vital signs and the nursing notes.  Pertinent labs & imaging results that were available during my care of the patient were reviewed by me and considered in my medical decision making (see chart for details).  Vitals and triage reviewed, patient is hemodynamically stable.  Dorsal aspect of the right hand with cat bite consistent with early cellulitis.  Will cover with Augmentin .  Tdap updated in clinic.  Rabies series withheld, cat is up-to-date.  Low concern for retained foreign body, imaging deferred at this time.     Final Clinical Impressions(s) / UC Diagnoses   Final diagnoses:  Cat bite of hand, initial encounter  Wound of upper extremity due to animal bite     Discharge Instructions      Your hand looks infected from your cat bite.  Please clean the area daily with warm water and antibacterial solution such as Dial soap or Hibiclens .  You can take 500 mg of Tylenol  every 8 hours as needed for pain and swelling.  Take the antibiotics twice daily with food for the next 7 days.  At the end of 7 days please either return to clinic or follow-up with your primary care provider  to ensure the area is healing properly.  Return to clinic sooner if symptoms do not improve or if symptoms change in any way.      ED Prescriptions     Medication Sig Dispense Auth. Provider   amoxicillin -clavulanate (AUGMENTIN ) 875-125 MG tablet Take 1 tablet by mouth every 12 (twelve) hours. 14 tablet Dreama, Maureen Duesing  N, FNP      PDMP not reviewed this encounter.   Dreama, Oree Mirelez  N, FNP 09/27/23 1043

## 2023-09-27 NOTE — Discharge Instructions (Signed)
 Your hand looks infected from your cat bite.  Please clean the area daily with warm water and antibacterial solution such as Dial soap or Hibiclens .  You can take 500 mg of Tylenol  every 8 hours as needed for pain and swelling.  Take the antibiotics twice daily with food for the next 7 days.  At the end of 7 days please either return to clinic or follow-up with your primary care provider to ensure the area is healing properly.  Return to clinic sooner if symptoms do not improve or if symptoms change in any way.

## 2023-10-02 DIAGNOSIS — G4733 Obstructive sleep apnea (adult) (pediatric): Secondary | ICD-10-CM | POA: Diagnosis not present

## 2023-11-01 ENCOUNTER — Other Ambulatory Visit: Payer: Self-pay | Admitting: Adult Health

## 2023-11-01 DIAGNOSIS — E7800 Pure hypercholesterolemia, unspecified: Secondary | ICD-10-CM

## 2024-01-12 IMAGING — CT CT CHEST LCS NODULE FOLLOW-UP W/O CM
1 of 2 series · 15 of 33 positions shown, 19 images · non-contrast
Comparison: Chest CT dated June 26, 2019

CLINICAL DATA: Former smoker with 100 pack-year history

EXAM:
CT CHEST WITHOUT CONTRAST FOR LUNG CANCER SCREENING NODULE FOLLOW-UP
TECHNIQUE: Multidetector CT imaging of the chest was performed following the
standard protocol without IV contrast.
RADIATION DOSE REDUCTION: This exam was performed according to the
departmental dose-optimization program which includes automated
exposure control, adjustment of the mA and/or kV according to
patient size and/or use of iterative reconstruction technique.

[Series 6: super d · axial · 0.78mm/px · z∈[-171,+141]mm · 15 of 428 slices shown, 19 images]
[im 19/428  mediastinal]
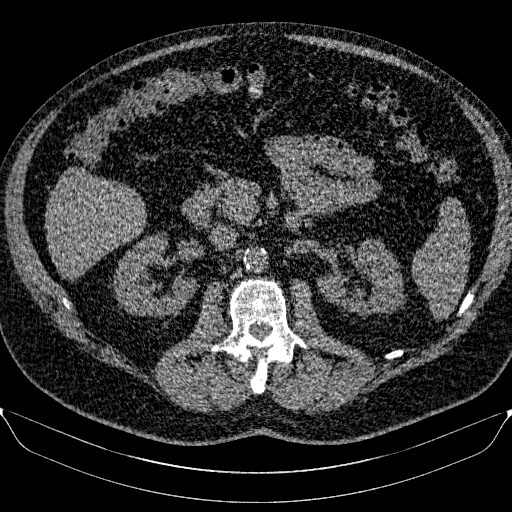
[im 19/428  lung]
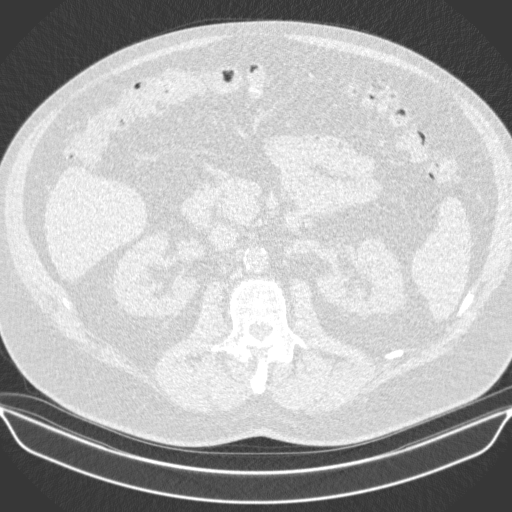
[im 56/428  lung]
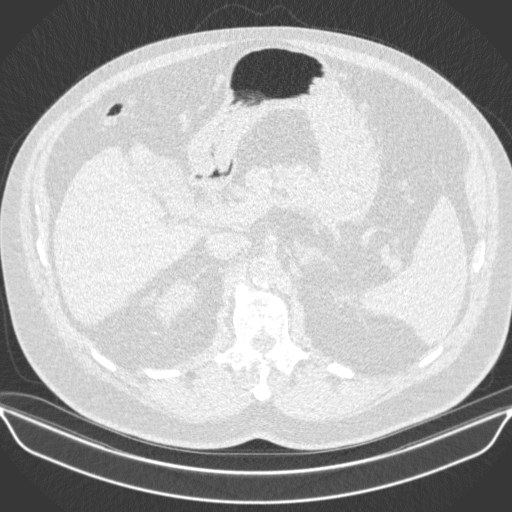
[im 93/428  lung]
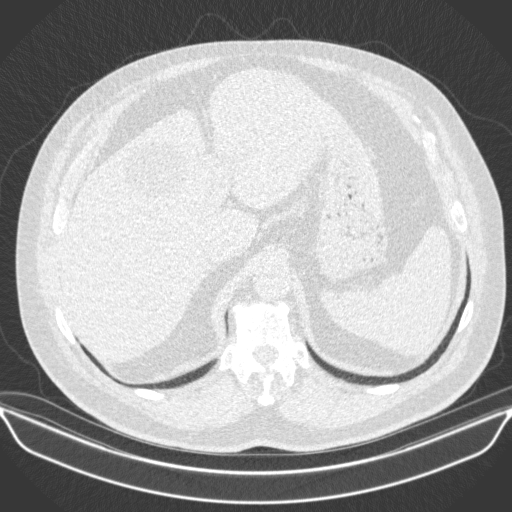
[im 112/428  lung]
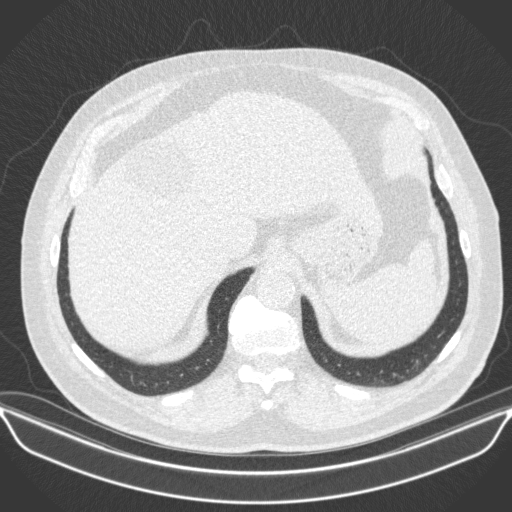
[im 130/428  mediastinal]
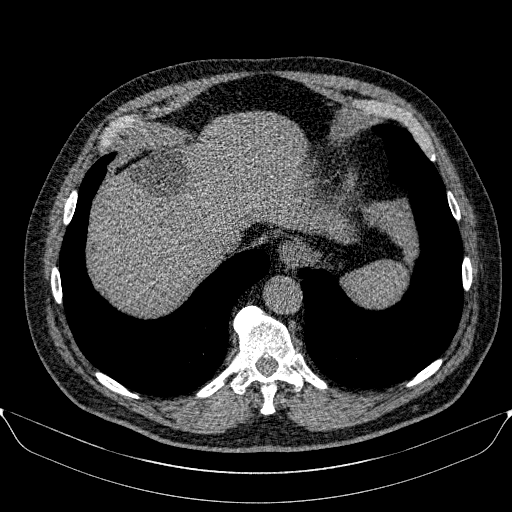
[im 130/428  lung]
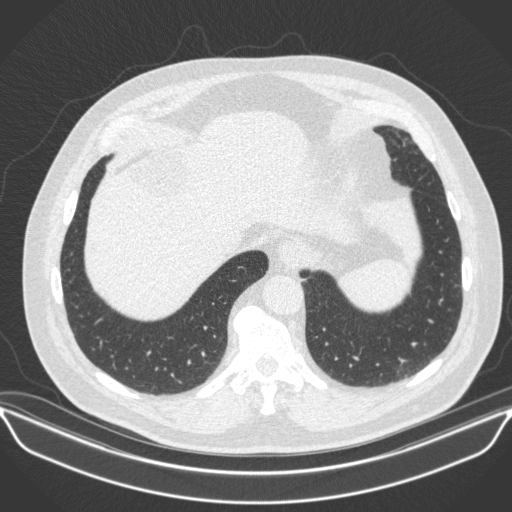
[im 168/428  lung]
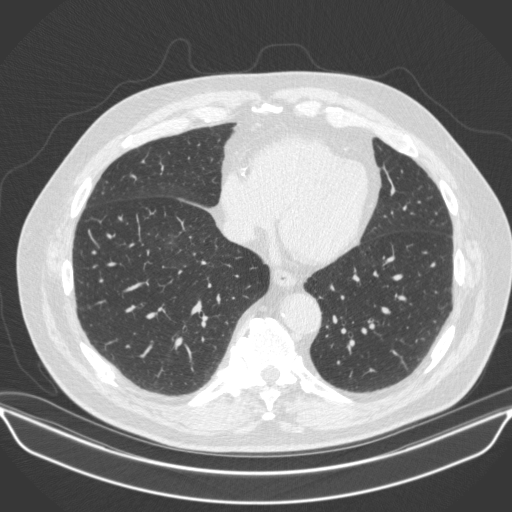
[im 203/428  lung]
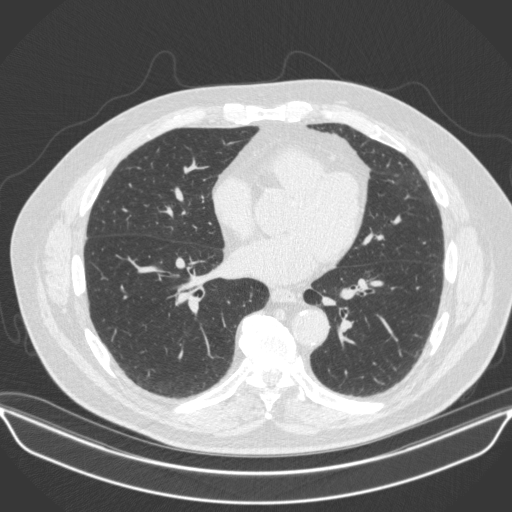
[im 214/428  lung]
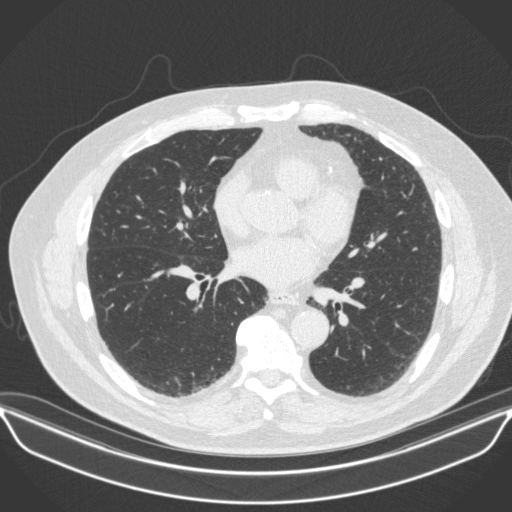
[im 223/428  mediastinal]
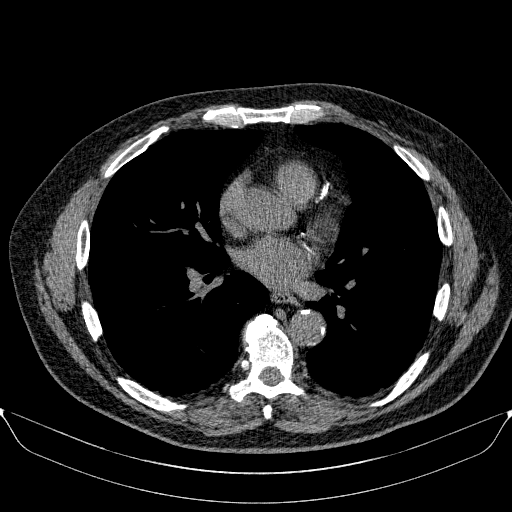
[im 223/428  lung]
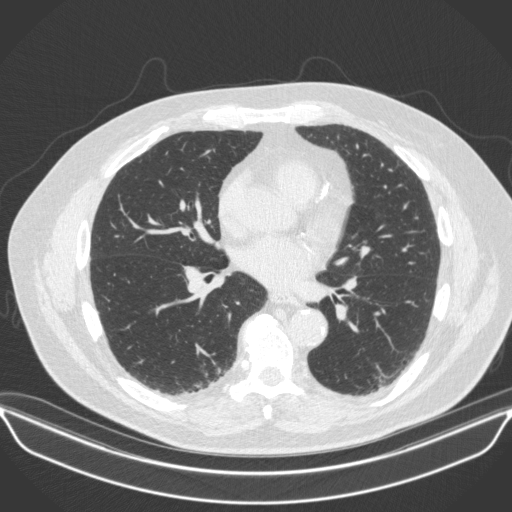
[im 260/428  lung]
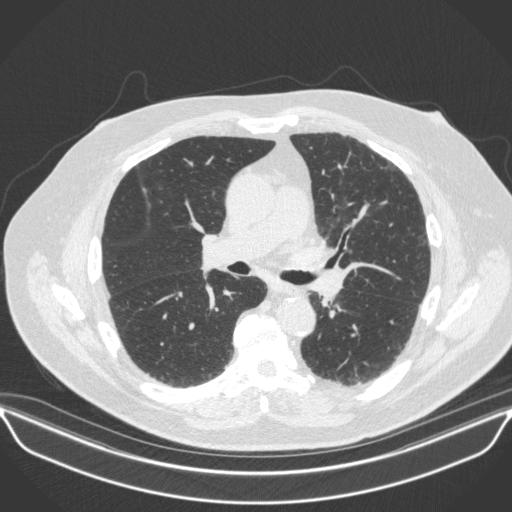
[im 298/428  lung]
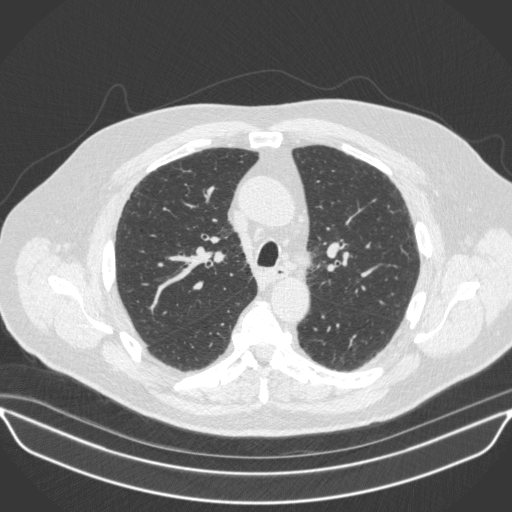
[im 316/428  lung]
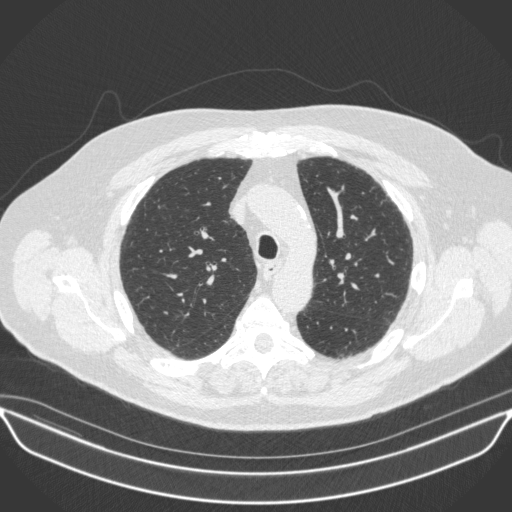
[im 335/428  mediastinal]
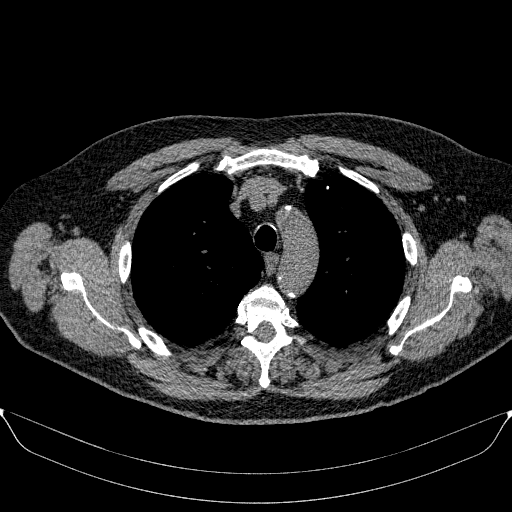
[im 335/428  lung]
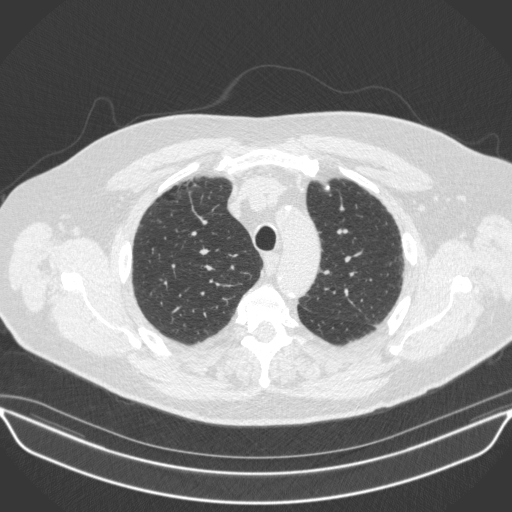
[im 372/428  lung]
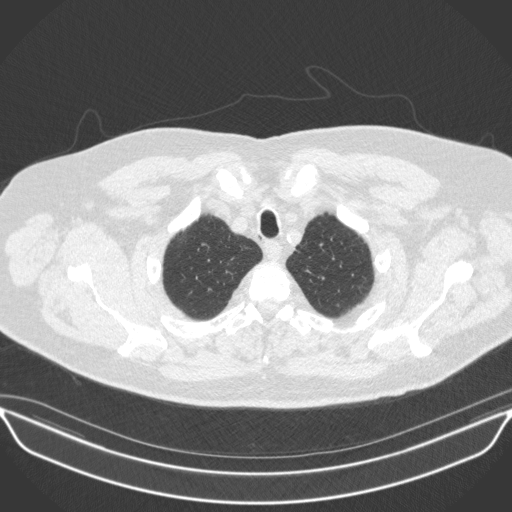
[im 409/428  lung]
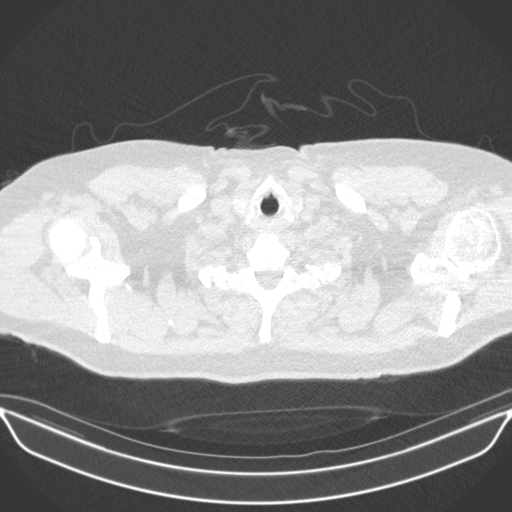

[15 of 33 positions shown; findings below may reference images not displayed]

FINDINGS: Cardiovascular: Normal heart size. No pericardial effusion.
Three-vessel coronary artery calcifications with LAD stent.
Atherosclerotic disease of the thoracic aorta.

Mediastinum/Nodes: Esophagus and thyroid are unremarkable. No
pathologically enlarged lymph nodes seen in the chest.

Lungs/Pleura: Central airways are patent. Lobe predominant
paraseptal emphysema. No consolidation, pleural effusion or
pneumothorax. Stable solid nodule of the superior portion of the
left lower lobe measuring 10.3 mm in mean diameter on image 101.

Upper Abdomen: Low-density lesion the central liver, unchanged when
compared to prior exam, likely a simple cyst. No acute abnormality.

Musculoskeletal: Subacute appearing posteriorright ninth rib
fractures. No aggressive appearing osseous lesions.
IMPRESSION: 1. Lung-RADS 2, benign appearance or behavior. Continue annual
screening with low-dose chest CT without contrast in 12 months.
2. Coronary artery calcifications, aortic Atherosclerosis
([id number]-PG2.2) and Emphysema ([id number].7).

## 2024-01-15 ENCOUNTER — Other Ambulatory Visit: Payer: Self-pay

## 2024-01-15 DIAGNOSIS — R911 Solitary pulmonary nodule: Secondary | ICD-10-CM

## 2024-02-03 ENCOUNTER — Other Ambulatory Visit

## 2024-02-07 ENCOUNTER — Ambulatory Visit
Admission: RE | Admit: 2024-02-07 | Discharge: 2024-02-07 | Disposition: A | Source: Ambulatory Visit | Attending: Acute Care | Admitting: Acute Care

## 2024-02-07 DIAGNOSIS — R911 Solitary pulmonary nodule: Secondary | ICD-10-CM

## 2024-02-17 ENCOUNTER — Ambulatory Visit: Admitting: Acute Care

## 2024-02-21 ENCOUNTER — Ambulatory Visit: Admitting: Acute Care
